# Patient Record
Sex: Female | Born: 1980 | Race: Black or African American | Hispanic: No | Marital: Married | Smoking: Current every day smoker
Health system: Southern US, Community
[De-identification: ages and names within clinical notes are randomized; demographics above are authoritative.]

## PROBLEM LIST (undated history)

## (undated) ENCOUNTER — Inpatient Hospital Stay (HOSPITAL_COMMUNITY): Payer: Self-pay

## (undated) DIAGNOSIS — A549 Gonococcal infection, unspecified: Secondary | ICD-10-CM

## (undated) DIAGNOSIS — R87619 Unspecified abnormal cytological findings in specimens from cervix uteri: Secondary | ICD-10-CM

## (undated) DIAGNOSIS — N739 Female pelvic inflammatory disease, unspecified: Secondary | ICD-10-CM

## (undated) DIAGNOSIS — N83209 Unspecified ovarian cyst, unspecified side: Secondary | ICD-10-CM

## (undated) DIAGNOSIS — A599 Trichomoniasis, unspecified: Secondary | ICD-10-CM

## (undated) DIAGNOSIS — D219 Benign neoplasm of connective and other soft tissue, unspecified: Secondary | ICD-10-CM

## (undated) DIAGNOSIS — IMO0002 Reserved for concepts with insufficient information to code with codable children: Secondary | ICD-10-CM

---
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
## 2000-11-11 ENCOUNTER — Emergency Department (HOSPITAL_COMMUNITY): Admission: EM | Admit: 2000-11-11 | Discharge: 2000-11-11 | Payer: Self-pay | Admitting: Emergency Medicine

## 2000-11-21 ENCOUNTER — Emergency Department (HOSPITAL_COMMUNITY): Admission: EM | Admit: 2000-11-21 | Discharge: 2000-11-21 | Payer: Self-pay | Admitting: Emergency Medicine

## 2001-03-09 ENCOUNTER — Emergency Department (HOSPITAL_COMMUNITY): Admission: EM | Admit: 2001-03-09 | Discharge: 2001-03-09 | Payer: Self-pay | Admitting: Emergency Medicine

## 2001-03-10 ENCOUNTER — Inpatient Hospital Stay (HOSPITAL_COMMUNITY): Admission: AD | Admit: 2001-03-10 | Discharge: 2001-03-10 | Payer: Self-pay | Admitting: Obstetrics & Gynecology

## 2001-04-13 ENCOUNTER — Emergency Department (HOSPITAL_COMMUNITY): Admission: EM | Admit: 2001-04-13 | Discharge: 2001-04-13 | Payer: Self-pay | Admitting: Emergency Medicine

## 2001-04-14 ENCOUNTER — Encounter: Payer: Self-pay | Admitting: Emergency Medicine

## 2001-04-14 ENCOUNTER — Emergency Department (HOSPITAL_COMMUNITY): Admission: EM | Admit: 2001-04-14 | Discharge: 2001-04-14 | Payer: Self-pay | Admitting: Emergency Medicine

## 2001-04-28 ENCOUNTER — Emergency Department (HOSPITAL_COMMUNITY): Admission: EM | Admit: 2001-04-28 | Discharge: 2001-04-28 | Payer: Self-pay | Admitting: Podiatry

## 2001-04-30 ENCOUNTER — Emergency Department (HOSPITAL_COMMUNITY): Admission: EM | Admit: 2001-04-30 | Discharge: 2001-04-30 | Payer: Self-pay | Admitting: Emergency Medicine

## 2001-05-21 ENCOUNTER — Emergency Department (HOSPITAL_COMMUNITY): Admission: EM | Admit: 2001-05-21 | Discharge: 2001-05-21 | Payer: Self-pay | Admitting: Emergency Medicine

## 2001-05-22 ENCOUNTER — Encounter: Payer: Self-pay | Admitting: Emergency Medicine

## 2001-05-28 ENCOUNTER — Inpatient Hospital Stay: Admission: AD | Admit: 2001-05-28 | Discharge: 2001-05-28 | Payer: Self-pay | Admitting: *Deleted

## 2001-09-22 ENCOUNTER — Emergency Department (HOSPITAL_COMMUNITY): Admission: EM | Admit: 2001-09-22 | Discharge: 2001-09-22 | Payer: Self-pay | Admitting: Emergency Medicine

## 2003-10-21 ENCOUNTER — Emergency Department (HOSPITAL_COMMUNITY): Admission: EM | Admit: 2003-10-21 | Discharge: 2003-10-21 | Payer: Self-pay | Admitting: Emergency Medicine

## 2003-11-29 ENCOUNTER — Emergency Department (HOSPITAL_COMMUNITY): Admission: EM | Admit: 2003-11-29 | Discharge: 2003-11-29 | Payer: Self-pay | Admitting: Emergency Medicine

## 2004-04-25 ENCOUNTER — Emergency Department (HOSPITAL_COMMUNITY): Admission: EM | Admit: 2004-04-25 | Discharge: 2004-04-26 | Payer: Self-pay | Admitting: Emergency Medicine

## 2004-07-16 ENCOUNTER — Emergency Department (HOSPITAL_COMMUNITY): Admission: EM | Admit: 2004-07-16 | Discharge: 2004-07-16 | Payer: Self-pay | Admitting: Emergency Medicine

## 2004-11-28 ENCOUNTER — Emergency Department (HOSPITAL_COMMUNITY): Admission: EM | Admit: 2004-11-28 | Discharge: 2004-11-28 | Payer: Self-pay | Admitting: Emergency Medicine

## 2004-12-02 ENCOUNTER — Emergency Department (HOSPITAL_COMMUNITY): Admission: EM | Admit: 2004-12-02 | Discharge: 2004-12-02 | Payer: Self-pay | Admitting: Emergency Medicine

## 2005-04-30 ENCOUNTER — Emergency Department (HOSPITAL_COMMUNITY): Admission: EM | Admit: 2005-04-30 | Discharge: 2005-04-30 | Payer: Self-pay | Admitting: Emergency Medicine

## 2005-05-17 ENCOUNTER — Emergency Department (HOSPITAL_COMMUNITY): Admission: EM | Admit: 2005-05-17 | Discharge: 2005-05-17 | Payer: Self-pay | Admitting: Emergency Medicine

## 2005-12-13 ENCOUNTER — Emergency Department (HOSPITAL_COMMUNITY): Admission: EM | Admit: 2005-12-13 | Discharge: 2005-12-13 | Payer: Self-pay | Admitting: Emergency Medicine

## 2007-12-31 ENCOUNTER — Inpatient Hospital Stay (HOSPITAL_COMMUNITY): Admission: AD | Admit: 2007-12-31 | Discharge: 2007-12-31 | Payer: Self-pay | Admitting: Obstetrics & Gynecology

## 2008-01-18 ENCOUNTER — Emergency Department (HOSPITAL_COMMUNITY): Admission: EM | Admit: 2008-01-18 | Discharge: 2008-01-18 | Payer: Self-pay | Admitting: Emergency Medicine

## 2008-07-18 ENCOUNTER — Ambulatory Visit: Payer: Self-pay | Admitting: Advanced Practice Midwife

## 2008-07-18 ENCOUNTER — Inpatient Hospital Stay (HOSPITAL_COMMUNITY): Admission: AD | Admit: 2008-07-18 | Discharge: 2008-07-18 | Payer: Self-pay | Admitting: Obstetrics & Gynecology

## 2008-07-21 ENCOUNTER — Inpatient Hospital Stay (HOSPITAL_COMMUNITY): Admission: AD | Admit: 2008-07-21 | Discharge: 2008-07-21 | Payer: Self-pay | Admitting: Family Medicine

## 2008-10-31 ENCOUNTER — Emergency Department (HOSPITAL_COMMUNITY): Admission: EM | Admit: 2008-10-31 | Discharge: 2008-10-31 | Payer: Self-pay | Admitting: Emergency Medicine

## 2009-03-31 ENCOUNTER — Inpatient Hospital Stay (HOSPITAL_COMMUNITY): Admission: AD | Admit: 2009-03-31 | Discharge: 2009-03-31 | Payer: Self-pay | Admitting: Family Medicine

## 2009-05-14 ENCOUNTER — Emergency Department (HOSPITAL_COMMUNITY): Admission: EM | Admit: 2009-05-14 | Discharge: 2009-05-14 | Payer: Self-pay | Admitting: Emergency Medicine

## 2010-06-01 ENCOUNTER — Encounter (INDEPENDENT_AMBULATORY_CARE_PROVIDER_SITE_OTHER): Payer: Self-pay | Admitting: Obstetrics and Gynecology

## 2010-06-01 ENCOUNTER — Other Ambulatory Visit: Payer: Self-pay | Admitting: Obstetrics and Gynecology

## 2010-06-01 DIAGNOSIS — R102 Pelvic and perineal pain: Secondary | ICD-10-CM

## 2010-06-01 DIAGNOSIS — Z09 Encounter for follow-up examination after completed treatment for conditions other than malignant neoplasm: Secondary | ICD-10-CM

## 2010-06-01 DIAGNOSIS — N949 Unspecified condition associated with female genital organs and menstrual cycle: Secondary | ICD-10-CM

## 2010-06-01 DIAGNOSIS — N946 Dysmenorrhea, unspecified: Secondary | ICD-10-CM

## 2010-06-05 ENCOUNTER — Ambulatory Visit (HOSPITAL_COMMUNITY)
Admission: RE | Admit: 2010-06-05 | Discharge: 2010-06-05 | Disposition: A | Discharge status: Home / Self Care | Payer: Self-pay | Source: Ambulatory Visit | Attending: Obstetrics and Gynecology | Admitting: Obstetrics and Gynecology

## 2010-06-05 DIAGNOSIS — Z09 Encounter for follow-up examination after completed treatment for conditions other than malignant neoplasm: Secondary | ICD-10-CM

## 2010-06-05 DIAGNOSIS — N809 Endometriosis, unspecified: Secondary | ICD-10-CM | POA: Insufficient documentation

## 2010-06-05 DIAGNOSIS — R102 Pelvic and perineal pain: Secondary | ICD-10-CM

## 2010-06-05 DIAGNOSIS — N949 Unspecified condition associated with female genital organs and menstrual cycle: Secondary | ICD-10-CM | POA: Insufficient documentation

## 2010-06-05 DIAGNOSIS — D252 Subserosal leiomyoma of uterus: Secondary | ICD-10-CM | POA: Insufficient documentation

## 2010-06-21 ENCOUNTER — Encounter (HOSPITAL_COMMUNITY)
Admission: RE | Admit: 2010-06-21 | Discharge: 2010-06-21 | Disposition: A | Discharge status: Home / Self Care | Payer: Self-pay | Source: Ambulatory Visit | Attending: Obstetrics and Gynecology | Admitting: Obstetrics and Gynecology

## 2010-06-21 DIAGNOSIS — Z01812 Encounter for preprocedural laboratory examination: Secondary | ICD-10-CM | POA: Insufficient documentation

## 2010-06-21 LAB — CBC
HCT: 38.9 % (ref 36.0–46.0)
Hemoglobin: 13 g/dL (ref 12.0–15.0)
MCH: 27.8 pg (ref 26.0–34.0)
MCHC: 33.4 g/dL (ref 30.0–36.0)
MCV: 83.3 fL (ref 78.0–100.0)
RBC: 4.67 MIL/uL (ref 3.87–5.11)

## 2010-06-21 LAB — SURGICAL PCR SCREEN: Staphylococcus aureus: NEGATIVE

## 2010-06-23 LAB — URINE CULTURE
Colony Count: NO GROWTH
Culture: NO GROWTH

## 2010-06-23 LAB — URINE MICROSCOPIC-ADD ON

## 2010-06-23 LAB — URINALYSIS, ROUTINE W REFLEX MICROSCOPIC
Glucose, UA: NEGATIVE mg/dL
Ketones, ur: 40 mg/dL — AB
Leukocytes, UA: NEGATIVE
Nitrite: NEGATIVE
Protein, ur: 100 mg/dL — AB
Specific Gravity, Urine: >1.046 — ABNORMAL HIGH (ref 1.005–1.030)
Urobilinogen, UA: 1 mg/dL (ref 0.0–1.0)
pH: 5.5 (ref 5.0–8.0)

## 2010-06-23 LAB — RAPID STREP SCREEN (MED CTR MEBANE ONLY): Streptococcus, Group A Screen (Direct): POSITIVE — AB

## 2010-06-23 NOTE — H&P (Signed)
NAME:  [name] [name], [name] [name]           ACCOUNT NO.:  [id number]  MEDICAL RECORD NO.:  [id number]           PATIENT TYPE:  A  LOCATION:  [facility]                   FACILITY:  [facility]  PHYSICIAN:  Catalina Antigua, MD     DATE OF BIRTH:  1980/07/27  DATE OF SERVICE:                          PRE-OP HISTORY & PHYSICAL  This is a 30 year old nulligravida patient who presents today for evaluation of pelvic pain and irregular period.  The patient states that over the past few years, she has experienced severe dysmenorrhea and has been medically managed with pain medication.  The patient states that she has been actively trying to seek pregnancy for the past 10 years but has not been able to change still, states that for since the onset of her menses at the age of 63.  She is experience irregular bleeding. Describing her periods as occurring as lasting for 5 to 7 days with no intermenstrual bleeding, but states that her periods may come on for 2 months consecutively and get a month or two and then come back again. The patient states that she has tried birth control pills in the past to help regulate her periods, but self-discontinued up to now since that she would experience intermenstrual spotting and a heavier flow.  The patient presents today for further evaluation as she truly desires to conceive.  The patient is married, in a monogamous relationship, and her husband has had children of his own.  PAST MEDICAL HISTORY:  The patient described pelvic pain that is localized in her lower pelvis and also in her left lower quadrant.  The patient states that the pain is 3/10 on most days, but becomes extremely severe with the onset of her menses.  PAST SURGICAL HISTORY:  She denies.  PAST OB HISTORY:  She is nulligravid.  PAST GYN HISTORY:  She reports history of abnormal Pap smear and has had colposcopy.  The patient does not recall her last Pap smear, but states that she has never had a normal  Pap smear.  She has a history of gonorrhea approximately 10 years ago, which was treated, otherwise denies.  She does have a history of ovarian cyst in the past and she currently has two small fibroids.  SOCIAL HISTORY:  She is a current smoker of a few cigarettes a day that she has been smoking for the past 10 years.  The patient is actively trying to quit smoking and I commended her for that.  She denies drinking or the use of illicit drugs.  REVIEW OF SYSTEMS:  Otherwise within normal limits.  FAMILY HISTORY:  Significant for diabetes and hypertension.  No history of GYN malignancies.  PHYSICAL EXAM:  VITAL SIGNS:  Her blood pressure is 123/80, pulse of 70, weight of 61.4 kg, height of 59-1/2 inches. LUNGS:  Clear to auscultation bilaterally. HEART:  Regular rate and rhythm. ABDOMEN:  Soft, nontender, nondistended. PELVIC:  She has a small uterus.  No palpable adnexal masses or tenderness.  Speculum exam showed normal-appearing vaginal mucosa and cervix.  No abnormal bleeding or discharge.  The patient had last ultrasound performed in December 2010, which demonstrated a uterus measuring 8 x 4 x  5 cm with 2 small myometrial fibroid, measuring 1.1 cm and the other 1.3 cm.  She had normal appearing ovaries.  No adnexal masses.  No abdominal free fluid.  ASSESSMENT AND PLAN:  This is a 30 year old nulligravida patient with a history of pelvic pain and dysmenorrhea, who presents today requesting intervention.  In view of the patient's symptoms, the patient was counseled on the possibility that she may have endometriosis, also explained to the patient that management of endometriosis was hormonal with birth control.  As the patient is actively trying to seek pregnancy, she is not interested in any initiation of birth control. The other option discussed was a diagnostic laparoscopy to confirm with possible ablation of the endometrial implants in order to immediately try to conceive.   The patient understands that her fertility may not be completely altered following completely improved following ablation of endometrial implants, but would like to try.  The patient also understands that she would have to be followed in our Infertility Clinic for further management of her infertility.  The patient verbalized understanding and all questions were answered.  The patient is interested in moving forehead with surgical intervention with diagnostic laparoscopy with possible ablation of endometrial implants.  In the meantime, we will order a repeat pelvic ultrasound as her last ultrasound was in December 2010.  The patient verbalized understanding and will be scheduled for surgical intervention.          ______________________________ Catalina Antigua, MD    PC/MEDQ  D:  06/01/2010  T:  06/01/2010  Job:  [id number]

## 2010-06-27 ENCOUNTER — Other Ambulatory Visit: Payer: Self-pay | Admitting: Obstetrics and Gynecology

## 2010-06-27 ENCOUNTER — Ambulatory Visit (HOSPITAL_COMMUNITY)
Admission: RE | Admit: 2010-06-27 | Discharge: 2010-06-27 | Disposition: A | Discharge status: Home / Self Care | Payer: Self-pay | Source: Ambulatory Visit | Attending: Obstetrics and Gynecology | Admitting: Obstetrics and Gynecology

## 2010-06-27 DIAGNOSIS — N946 Dysmenorrhea, unspecified: Secondary | ICD-10-CM | POA: Insufficient documentation

## 2010-07-03 LAB — DIFFERENTIAL
Basophils Absolute: 0 10*3/uL (ref 0.0–0.1)
Lymphocytes Relative: 29 % (ref 12–46)
Lymphs Abs: 1.6 10*3/uL (ref 0.7–4.0)
Neutro Abs: 3.3 10*3/uL (ref 1.7–7.7)
Neutrophils Relative %: 61 % (ref 43–77)

## 2010-07-03 LAB — GC/CHLAMYDIA PROBE AMP, GENITAL
Chlamydia, DNA Probe: NEGATIVE
GC Probe Amp, Genital: NEGATIVE

## 2010-07-03 LAB — POCT PREGNANCY, URINE: Preg Test, Ur: NEGATIVE

## 2010-07-03 LAB — CBC
HCT: 38.5 % (ref 36.0–46.0)
MCHC: 33.5 g/dL (ref 30.0–36.0)
MCV: 84.6 fL (ref 78.0–100.0)
RBC: 4.56 MIL/uL (ref 3.87–5.11)
WBC: 5.3 10*3/uL (ref 4.0–10.5)

## 2010-07-03 LAB — COMPREHENSIVE METABOLIC PANEL
BUN: 3 mg/dL — ABNORMAL LOW (ref 6–23)
CO2: 28 mEq/L (ref 19–32)
Calcium: 9.7 mg/dL (ref 8.4–10.5)
Chloride: 101 mEq/L (ref 96–112)
Creatinine, Ser: 0.73 mg/dL (ref 0.4–1.2)
GFR calc non Af Amer: >60 mL/min (ref >60)
Total Bilirubin: 0.4 mg/dL (ref 0.3–1.2)

## 2010-07-03 LAB — URINALYSIS, ROUTINE W REFLEX MICROSCOPIC
Bilirubin Urine: NEGATIVE
Hgb urine dipstick: NEGATIVE
Ketones, ur: NEGATIVE mg/dL
Nitrite: NEGATIVE
Specific Gravity, Urine: 1.01 (ref 1.005–1.030)
Urobilinogen, UA: 0.2 mg/dL (ref 0.0–1.0)
pH: 7.5 (ref 5.0–8.0)

## 2010-07-03 LAB — WET PREP, GENITAL: Clue Cells Wet Prep HPF POC: NONE SEEN

## 2010-07-05 ENCOUNTER — Ambulatory Visit: Payer: Self-pay | Admitting: Obstetrics and Gynecology

## 2010-07-05 DIAGNOSIS — Z09 Encounter for follow-up examination after completed treatment for conditions other than malignant neoplasm: Secondary | ICD-10-CM

## 2010-07-06 NOTE — Progress Notes (Signed)
NAME:  [name] [name], [name] [name]           ACCOUNT NO.:  [id number]  MEDICAL RECORD NO.:  [id number]           PATIENT TYPE:  A  LOCATION:  [facility]                   FACILITY:  [facility]  PHYSICIAN:  Catalina Antigua, MD     DATE OF BIRTH:  08/10/1980  DATE OF SERVICE:                                 CLINIC NOTE  This is a 30 year old gravida 0 with a longstanding history of dysmenorrhea who is status post diagnostic laparoscopy, lysis of adhesion and pelvic biopsies on June 27, 2010, who presents today for postoperative check.  The patient is currently without complaints. States that her pain has completely resolved, is no longer experiencing any lingering pelvic pain.  DESCRIPTION OF FINDINGS:  At the time of the procedure were revealed to the patient.  The patient verbalized understanding.  All questions were answered.  PHYSICAL EXAMINATION:  VITAL SIGNS:  Her blood pressure is 103/65, pulse of 68, weight of 61.6 kg, height of 69-1/2 inches. ABDOMEN:  Soft, nontender, nondistended.  Umbilical incision as well as the right lower quadrant incisions are healing well.  No erythema, induration or drainage.  ASSESSMENT AND PLAN:  This is a 30 year old nulligravida patient with history of dysmenorrhea with status post diagnostic laparoscopy and lysis of adhesion.  The patient was informed that she has no evidence of endometriosis.  The patient is medically cleared to resume all activities of daily living.  The patient desires to conceive in the near future.  The patient was advised to use over-the-counter ovulation kits as to aid in the timing of intercourse.  If the patient has not been successful, the patient will schedule an appointment in our infertility clinic.          ______________________________ Catalina Antigua, MD    PC/MEDQ  D:  07/05/2010  T:  07/06/2010  Job:  [id number]

## 2010-07-08 LAB — URINALYSIS, ROUTINE W REFLEX MICROSCOPIC
Bilirubin Urine: NEGATIVE
Leukocytes, UA: NEGATIVE
Nitrite: NEGATIVE
Specific Gravity, Urine: 1.022 (ref 1.005–1.030)
Urobilinogen, UA: 0.2 mg/dL (ref 0.0–1.0)
pH: 8 (ref 5.0–8.0)

## 2010-07-08 LAB — CBC
MCHC: 32.8 g/dL (ref 30.0–36.0)
MCV: 84.8 fL (ref 78.0–100.0)
RBC: 5.12 MIL/uL — ABNORMAL HIGH (ref 3.87–5.11)
RDW: 13.7 % (ref 11.5–15.5)

## 2010-07-08 LAB — LIPASE, BLOOD: Lipase: 16 U/L (ref 11–59)

## 2010-07-08 LAB — DIFFERENTIAL
Lymphocytes Relative: 8 % — ABNORMAL LOW (ref 12–46)
Lymphs Abs: 0.7 10*3/uL (ref 0.7–4.0)
Monocytes Relative: 3 % (ref 3–12)
Neutrophils Relative %: 89 % — ABNORMAL HIGH (ref 43–77)

## 2010-07-08 LAB — COMPREHENSIVE METABOLIC PANEL
AST: 25 U/L (ref 0–37)
CO2: 26 mEq/L (ref 19–32)
Calcium: 10.2 mg/dL (ref 8.4–10.5)
Creatinine, Ser: 0.8 mg/dL (ref 0.4–1.2)
GFR calc Af Amer: >60 mL/min (ref >60)
GFR calc non Af Amer: >60 mL/min (ref >60)
Glucose, Bld: 105 mg/dL — ABNORMAL HIGH (ref 70–99)
Sodium: 141 mEq/L (ref 135–145)
Total Protein: 8.4 g/dL — ABNORMAL HIGH (ref 6.0–8.3)

## 2010-07-08 LAB — URINE MICROSCOPIC-ADD ON

## 2010-07-08 LAB — POCT PREGNANCY, URINE: Preg Test, Ur: NEGATIVE

## 2010-07-11 NOTE — Op Note (Signed)
  NAME:  [name] [name], [name] [name]           ACCOUNT NO.:  [id number]  MEDICAL RECORD NO.:  [id number]           PATIENT TYPE:  O  LOCATION:  [facility]                          [facility]  PHYSICIAN:  Catalina Antigua, MD     DATE OF BIRTH:  Oct 02, 1980  DATE OF PROCEDURE:  06/27/2010 DATE OF DISCHARGE:                              OPERATIVE REPORT   PREOPERATIVE DIAGNOSIS:  A 30 year old gravida 0 with a longstanding history of dysmenorrhea who presents today for a diagnostic laparoscopy.  POSTOPERATIVE DIAGNOSIS:  A 30 year old gravida 0 with a longstanding history of dysmenorrhea who presents today for a diagnostic laparoscopy.  PROCEDURE:  Diagnostic laparoscopy, lysis of adhesion, and pelvic biopsies.  SURGEON:  Catalina Antigua, MD  ASSISTANT:  None.  ANESTHESIA:  General.  IV FLUIDS:  1300 mL.  URINE OUTPUT:  100 mL.  ESTIMATED BLOOD LOSS:  Minimal.  COMPLICATIONS:  None.  FINDINGS:  Grossly normal uterus, tubes, and ovaries x2.  Adhesions between adnexa and posterior cul-de-sac bilaterally.  Specimen collected were pelvic biopsies and they were sent to pathology.  PROCEDURE:  After informed consent was obtained, the patient was taken to the operating room where anesthesia was induced and found to be adequate.  The patient was placed in dorsal lithotomy position and prepped and draped in the usual sterile fashion.  A Hulka uterine manipulator was introduced into the uterine cavity.  The surgeons' gloves were then changed and attention was turned to the abdomen. Approximately 1-cm infraumbilical skin incision was made with a scalpel and carried down to the underlying layer of fascia with blunt dissection.  The fascia was grasped with Kocher clamps, tented up, and entered sharply with Mayo scissors.  The fascial incision was tagged with 0 Vicryl.  The perineum was tented up and entered sharply with Metzenbaum scissors.  The 10-mm Hasson was introduced into the  abdominal cavity.  Intra-abdominal placement was confirmed with the laparoscope and pneumoperitoneum was achieved with insufflation of CO2 gas. Preliminary survey of the abdomen revealed the above-noted findings.  A 5-mm skin incision was made on the right side 2 cm above and 2 cm medial to the superior iliac spine.  Under direct visualization, a 5-mm trocar and sleeve were introduced into the abdominal cavity.  Using Endoshears, lysis of adhesion was then performed. In the posterior cul-de-sac where there appeared to be endometriosis, pelvic biopsies were then performed and sent to pathology.  The remainder of the pelvis appeared to be free of endometriosis.  Following lysis of adhesion, restoration of the anatomy was complete and intact.  Hemostasis was maintained.  All instruments were removed from the patient's abdomen.  CO2 gas was released from the abdomen.  The fascia was reapproximated with 0 Vicryl and the skin was closed with Vicryl in a subcuticular fashion.  The patient tolerated the procedure well.  Sponge, lap, and needle counts were correct x2.     Catalina Antigua, MD     PC/MEDQ  D:  06/27/2010  T:  06/28/2010  Job:  [id number]  Electronically Signed by Catalina Antigua  on 07/11/2010 10:18:03 AM

## 2010-07-12 LAB — GC/CHLAMYDIA PROBE AMP, GENITAL
Chlamydia, DNA Probe: NEGATIVE
GC Probe Amp, Genital: NEGATIVE

## 2010-07-12 LAB — URINALYSIS, ROUTINE W REFLEX MICROSCOPIC
Glucose, UA: NEGATIVE mg/dL
Nitrite: NEGATIVE
Specific Gravity, Urine: <1.005 — ABNORMAL LOW (ref 1.005–1.030)
pH: 5.5 (ref 5.0–8.0)

## 2010-07-12 LAB — WET PREP, GENITAL: Yeast Wet Prep HPF POC: NONE SEEN

## 2010-07-26 ENCOUNTER — Ambulatory Visit: Payer: Self-pay | Admitting: Obstetrics and Gynecology

## 2010-08-01 HISTORY — PX: DIAGNOSTIC LAPAROSCOPY: SUR761

## 2010-08-25 ENCOUNTER — Ambulatory Visit: Payer: Self-pay | Admitting: Obstetrics and Gynecology

## 2010-08-25 DIAGNOSIS — Z09 Encounter for follow-up examination after completed treatment for conditions other than malignant neoplasm: Secondary | ICD-10-CM

## 2010-08-26 NOTE — Group Therapy Note (Signed)
NAME:  [name], [name]           ACCOUNT NO.:  [id number]  MEDICAL RECORD NO.:  [id number]           PATIENT TYPE:  A  LOCATION[facility]                   FACILITY:  [facility]  PHYSICIAN:  Catalina Antigua, MD     DATE OF BIRTH:  [birth year]  DATE OF SERVICE:  08/25/2010                                 CLINIC NOTE  HISTORY OF PRESENT ILLNESS:  This is a 30 year old gravida 0 who is status post diagnostic laparoscopy and lysis of adhesions on June 27, 2010 who presents today for postoperative check.  The patient is currently without any complaints.  Denies abnormal bleeding or discharge.  She does states that she feels like her fibroids were causing her to have pain, but otherwise is doing well.  PHYSICAL EXAMINATION:  VITAL SIGNS:  Her blood pressure is 114/74, pulse of 79, weight of 60.8 kg, and height of 69-1/2 inches. LUNGS:  Clear to auscultation bilaterally. HEART:  Regular rate and rhythm. ABDOMEN:  Soft, nontender, and nondistended.  Her umbilical incision and right lower quadrant incision are completely healed. PELVIC:  Normal vaginal mucosa and normal-appearing cervix.  No abnormal bleeding or discharge.  ASSESSMENT AND PLAN:  This is a 30 year old nulligravida with history of dysmenorrhea who is status post diagnostic laparoscopy on June 27, 2010 who presents for postoperative check.  A wet prep was collected.  The patient reported a history of foul smelling discharge, although no malodorous odor was appreciated.  The patient was reassured and knowing that she had a normal-sized uterus with a subdural fibroid measuring 2 cm and 5 inches diameter and it sounds likely that this is the source of her pain.  The patient states that her dysmenorrhea has significantly improved.  The patient is still actively trying to conceive.  The patient was advised to return in 6 months to our Infertility Clinic if she has not been successful in conceiving.  The patient will  otherwise follow up in March for her annual exam.          ______________________________ Catalina Antigua, MD    PC/MEDQ  D:  08/25/2010  T:  08/26/2010  Job:  [id number]

## 2010-08-29 ENCOUNTER — Inpatient Hospital Stay (HOSPITAL_COMMUNITY)
Admission: AD | Admit: 2010-08-29 | Discharge: 2010-08-29 | Disposition: A | Discharge status: Home / Self Care | Payer: Self-pay | Source: Ambulatory Visit | Attending: Family Medicine | Admitting: Family Medicine

## 2010-08-29 ENCOUNTER — Inpatient Hospital Stay (HOSPITAL_COMMUNITY): Payer: Self-pay

## 2010-08-29 DIAGNOSIS — N83209 Unspecified ovarian cyst, unspecified side: Secondary | ICD-10-CM | POA: Insufficient documentation

## 2010-08-29 DIAGNOSIS — R109 Unspecified abdominal pain: Secondary | ICD-10-CM

## 2010-08-29 LAB — URINALYSIS, ROUTINE W REFLEX MICROSCOPIC
Bilirubin Urine: NEGATIVE
Glucose, UA: NEGATIVE mg/dL
Hgb urine dipstick: NEGATIVE
Nitrite: NEGATIVE
Specific Gravity, Urine: 1.02 (ref 1.005–1.030)
pH: 6.5 (ref 5.0–8.0)

## 2010-08-29 LAB — COMPREHENSIVE METABOLIC PANEL
ALT: 13 U/L (ref 0–35)
Alkaline Phosphatase: 59 U/L (ref 39–117)
CO2: 26 mEq/L (ref 19–32)
Calcium: 9.8 mg/dL (ref 8.4–10.5)
GFR calc non Af Amer: >60 mL/min (ref >60)
Glucose, Bld: 81 mg/dL (ref 70–99)
Potassium: 4 mEq/L (ref 3.5–5.1)
Sodium: 136 mEq/L (ref 135–145)

## 2010-08-29 LAB — DIFFERENTIAL
Basophils Absolute: 0 10*3/uL (ref 0.0–0.1)
Lymphocytes Relative: 32 % (ref 12–46)
Lymphs Abs: 1.8 10*3/uL (ref 0.7–4.0)
Monocytes Absolute: 0.5 10*3/uL (ref 0.1–1.0)
Neutro Abs: 3.2 10*3/uL (ref 1.7–7.7)

## 2010-08-29 LAB — CBC
HCT: 38.3 % (ref 36.0–46.0)
Hemoglobin: 12.7 g/dL (ref 12.0–15.0)
MCHC: 33.2 g/dL (ref 30.0–36.0)

## 2010-08-29 LAB — POCT PREGNANCY, URINE: Preg Test, Ur: NEGATIVE

## 2010-08-29 MED ORDER — IOHEXOL 300 MG/ML  SOLN
100.0000 mL | Freq: Once | INTRAMUSCULAR | Status: AC | PRN
  Administered 2010-08-29: 100 mL via INTRAVENOUS

## 2010-09-20 ENCOUNTER — Ambulatory Visit: Payer: Self-pay | Admitting: Obstetrics & Gynecology

## 2010-09-20 ENCOUNTER — Other Ambulatory Visit: Payer: Self-pay | Admitting: Obstetrics & Gynecology

## 2010-09-20 DIAGNOSIS — N949 Unspecified condition associated with female genital organs and menstrual cycle: Secondary | ICD-10-CM

## 2010-09-20 DIAGNOSIS — Z3009 Encounter for other general counseling and advice on contraception: Secondary | ICD-10-CM

## 2010-09-20 DIAGNOSIS — N83209 Unspecified ovarian cyst, unspecified side: Secondary | ICD-10-CM

## 2010-09-20 LAB — POCT PREGNANCY, URINE: Preg Test, Ur: NEGATIVE

## 2010-09-21 NOTE — Group Therapy Note (Unsigned)
NAMEMarland Kitchen  LYNNDA, WIERSMA           ACCOUNT NO.:  [id number]  MEDICAL RECORD NO.:  [id number]           PATIENT TYPE:  A  LOCATION:  [facility]                   [facility]ACILITY:  [facility]  PHYSICIAN:  Jaynie Collins, MD     DATE OF BIRTH:  Oct 17, 1980  DATE OF SERVICE:  09/20/2010                                 CLINIC NOTE  [name] is a 30 year old gravida 0 who was seen in the [facility] and was diagnosed with a 5-cm ruptured ovarian cyst.  The patient is here today for followup.  She denies any pain or any other concerns.  The patient just wants to be on birth control at this point and is requesting her prescription for birth control pills.  She declines having any physical examination.  The patient has been on birth control pills in the past, but has not taken it lately.  She was counseled regarding the different modalities, and she decided on Sprintec and a prescription was given to her.  She was told that she would need to come back in 3 months for blood pressure and OCP check, and she was told to call or come back in for any further gynecologic concerns.  The patient is up to date with her Pap smears, and this was done in March 2012.          ______________________________ Jaynie Collins, MD    UA/MEDQ  D:  09/20/2010  T:  09/21/2010  Job:  [id number]

## 2010-11-01 ENCOUNTER — Encounter: Payer: Self-pay | Admitting: *Deleted

## 2010-11-01 DIAGNOSIS — N979 Female infertility, unspecified: Secondary | ICD-10-CM

## 2010-11-01 DIAGNOSIS — N946 Dysmenorrhea, unspecified: Secondary | ICD-10-CM | POA: Insufficient documentation

## 2010-11-01 DIAGNOSIS — R102 Pelvic and perineal pain: Secondary | ICD-10-CM | POA: Insufficient documentation

## 2010-11-15 ENCOUNTER — Ambulatory Visit: Payer: Self-pay | Admitting: Obstetrics & Gynecology

## 2011-01-01 LAB — D-DIMER, QUANTITATIVE: D-Dimer, Quant: 0.5 — ABNORMAL HIGH

## 2011-01-01 LAB — CBC
Hemoglobin: 14
MCHC: 33.1
MCHC: 32.6
MCV: 83.8
MCV: 85
Platelets: 316
RBC: 5.05
RDW: 14

## 2011-01-01 LAB — LIPASE, BLOOD: Lipase: 19

## 2011-01-01 LAB — COMPREHENSIVE METABOLIC PANEL
BUN: 8
CO2: 28
Calcium: 10.1
Creatinine, Ser: 0.84
GFR calc non Af Amer: >60
Glucose, Bld: 104 — ABNORMAL HIGH
Sodium: 142
Total Protein: 8.8 — ABNORMAL HIGH

## 2011-01-01 LAB — URINE MICROSCOPIC-ADD ON

## 2011-01-01 LAB — DIFFERENTIAL
Eosinophils Absolute: 0
Lymphocytes Relative: 15
Lymphs Abs: 1.2
Monocytes Relative: 8
Neutro Abs: 6.6
Neutrophils Relative %: 77

## 2011-01-01 LAB — URINALYSIS, ROUTINE W REFLEX MICROSCOPIC
Bilirubin Urine: NEGATIVE
Glucose, UA: NEGATIVE
Hgb urine dipstick: NEGATIVE
Ketones, ur: NEGATIVE
Nitrite: NEGATIVE
Protein, ur: NEGATIVE
Specific Gravity, Urine: 1.027
Urobilinogen, UA: 0.2
Urobilinogen, UA: 0.2

## 2011-01-01 LAB — POCT CARDIAC MARKERS
CKMB, poc: <1 — ABNORMAL LOW
Myoglobin, poc: 53.3
Troponin i, poc: <0.05

## 2011-01-01 LAB — WET PREP, GENITAL: Clue Cells Wet Prep HPF POC: NONE SEEN

## 2011-01-01 LAB — GC/CHLAMYDIA PROBE AMP, GENITAL: GC Probe Amp, Genital: NEGATIVE

## 2011-02-19 ENCOUNTER — Inpatient Hospital Stay (HOSPITAL_COMMUNITY): Payer: Self-pay

## 2011-02-19 ENCOUNTER — Encounter (HOSPITAL_COMMUNITY): Payer: Self-pay | Admitting: *Deleted

## 2011-02-19 ENCOUNTER — Inpatient Hospital Stay (HOSPITAL_COMMUNITY)
Admission: AD | Admit: 2011-02-19 | Discharge: 2011-02-19 | Disposition: A | Discharge status: Home / Self Care | Payer: Self-pay | Source: Ambulatory Visit | Attending: Obstetrics & Gynecology | Admitting: Obstetrics & Gynecology

## 2011-02-19 DIAGNOSIS — N83209 Unspecified ovarian cyst, unspecified side: Secondary | ICD-10-CM | POA: Insufficient documentation

## 2011-02-19 DIAGNOSIS — N949 Unspecified condition associated with female genital organs and menstrual cycle: Secondary | ICD-10-CM

## 2011-02-19 DIAGNOSIS — N83299 Other ovarian cyst, unspecified side: Secondary | ICD-10-CM

## 2011-02-19 DIAGNOSIS — R109 Unspecified abdominal pain: Secondary | ICD-10-CM | POA: Insufficient documentation

## 2011-02-19 DIAGNOSIS — R102 Pelvic and perineal pain: Secondary | ICD-10-CM

## 2011-02-19 HISTORY — DX: Reserved for concepts with insufficient information to code with codable children: IMO0002

## 2011-02-19 HISTORY — DX: Unspecified abnormal cytological findings in specimens from cervix uteri: R87.619

## 2011-02-19 HISTORY — DX: Gonococcal infection, unspecified: A54.9

## 2011-02-19 HISTORY — DX: Trichomoniasis, unspecified: A59.9

## 2011-02-19 HISTORY — DX: Female pelvic inflammatory disease, unspecified: N73.9

## 2011-02-19 HISTORY — DX: Unspecified ovarian cyst, unspecified side: N83.209

## 2011-02-19 LAB — URINALYSIS, ROUTINE W REFLEX MICROSCOPIC
Bilirubin Urine: NEGATIVE
Ketones, ur: NEGATIVE mg/dL
Leukocytes, UA: NEGATIVE
Nitrite: NEGATIVE
Protein, ur: NEGATIVE mg/dL
pH: 6 (ref 5.0–8.0)

## 2011-02-19 LAB — CBC
HCT: 38.8 % (ref 36.0–46.0)
Hemoglobin: 12.9 g/dL (ref 12.0–15.0)
MCH: 28.2 pg (ref 26.0–34.0)
RBC: 4.58 MIL/uL (ref 3.87–5.11)

## 2011-02-19 LAB — DIFFERENTIAL
Eosinophils Absolute: 0.1 10*3/uL (ref 0.0–0.7)
Lymphs Abs: 1.4 10*3/uL (ref 0.7–4.0)
Monocytes Absolute: 0.4 10*3/uL (ref 0.1–1.0)
Monocytes Relative: 10 % (ref 3–12)
Neutro Abs: 2.5 10*3/uL (ref 1.7–7.7)
Neutrophils Relative %: 55 % (ref 43–77)

## 2011-02-19 LAB — ABO/RH: ABO/RH(D): O POS

## 2011-02-19 LAB — WET PREP, GENITAL
Clue Cells Wet Prep HPF POC: NONE SEEN
Trich, Wet Prep: NONE SEEN

## 2011-02-19 LAB — POCT PREGNANCY, URINE: Preg Test, Ur: NEGATIVE

## 2011-02-19 MED ORDER — OXYCODONE-ACETAMINOPHEN 5-325 MG PO TABS
2.0000 | ORAL_TABLET | Freq: Once | ORAL | Status: AC
  Administered 2011-02-19: 2 via ORAL
  Filled 2011-02-19: qty 2

## 2011-02-19 MED ORDER — OXYCODONE-ACETAMINOPHEN 5-325 MG PO TABS: 1.0000 | ORAL_TABLET | Freq: Four times a day (QID) | ORAL | Status: AC | PRN

## 2011-02-19 NOTE — ED Notes (Signed)
Pharmacy called regarding pain medication

## 2011-02-19 NOTE — Progress Notes (Signed)
Bleeding started yesterday.

## 2011-02-19 NOTE — ED Notes (Signed)
Pain still there, just sleepy now.

## 2011-02-19 NOTE — ED Provider Notes (Signed)
History   [name] [name] is a 30 y.o. year old G0P0000 female LMP 01/14/11 who presents to [facility] reporting low abd pain x 3 days and severe cramping and spotting since 02/18/11. She has a lengthy Hx of pelvic and abd pain, but state that this is different.  Hx added by E. Sloane Junkin, RNFNP---Patient states she had a normal BM today.  Reports pelvic pain begins 1 week prior to menses, painful menses and pain continues several days after bleeding.  She has 2 week interval free of pain.  She states she has been trying to conceive without success and is now tired of the pain and would like to discuss hysterectomy with her doctor in the clinic.  Reassured her we would message gyn clinic to call her for appt.    CSN: [id number] Arrival date & time: 02/19/2011  6:25 AM   None     Chief Complaint  Patient presents with  . Abdominal Pain  . Vaginal Bleeding    (Consider location/radiation/quality/duration/timing/severity/associated sxs/prior treatment) HPI  Past Medical History  Diagnosis Date  . Complication of anesthesia   . Ovarian cyst   . Abnormal Pap smear   . PID (pelvic inflammatory disease)   . Gonorrhea   . Trichomonas    Past Surgical History  Procedure Date  . Diagnostic laparoscopy May 2012  lysis of adhesions  The patient has a family history of:   History  Substance Use Topics  . Smoking status: Not on file  . Smokeless tobacco: Not on file  . Alcohol Use: Not on file    OB History    Grav Para Term Preterm Abortions TAB SAB Ect Mult Living   0 0 0 0 0 0 0 0 0 0       Review of Systems  Constitutional: Negative for fever and chills.       Hot flashes  Gastrointestinal: Positive for nausea and abdominal pain (constant bilat low abd pain and cramping LLQ). Negative for vomiting, diarrhea, constipation and abdominal distention.  Genitourinary: Positive for vaginal bleeding (spotting) and menstrual problem (delayed onset of menses. Spotting only). Negative for  dysuria, urgency, frequency, hematuria, flank pain, vaginal discharge and vaginal pain.    Allergies  Naproxen; Excedrin; and Ibuprofen  Home Medications  No current outpatient prescriptions on file.  BP 115/69  Pulse 66  Temp(Src) 98.7 F (37.1 C) (Oral)  Resp 16  Ht 4\' 11"  (1.499 m)  Wt 62.506 kg (137 lb 12.8 oz)  BMI 27.83 kg/m2  LMP 01/14/2011  Physical Exam  Nursing note and vitals reviewed. Constitutional: She is oriented to person, place, and time. She appears well-developed and well-nourished. She appears distressed (mild).  Cardiovascular: Normal rate.   Pulmonary/Chest: Effort normal.  Abdominal: Soft. Bowel sounds are normal. She exhibits distension (mild). She exhibits no mass. There is tenderness (suprapubic and LLQ). There is guarding (LLQ). There is no rebound.  Genitourinary: Uterus is tender. Uterus is not enlarged and not fixed. Cervix exhibits no motion tenderness, no discharge and no friability. Right adnexum displays tenderness. Right adnexum displays no mass and no fullness. Left adnexum displays tenderness. Left adnexum displays no mass and no fullness. There is tenderness around the vagina. No bleeding around the vagina. No vaginal discharge found.       Pelvic exam by Lynder Parents, RN FNP  Neurological: She is alert and oriented to person, place, and time.  Skin: Skin is warm and dry.  Psychiatric: She has a normal mood and affect.  Results for orders placed during the hospital encounter of 02/19/11 (from the past 24 hour(s))  URINALYSIS, ROUTINE W REFLEX MICROSCOPIC     Status: Abnormal   Collection Time   02/19/11  6:40 AM      Component Value Range   Color, Urine YELLOW  YELLOW    Appearance CLEAR  CLEAR    Specific Gravity, Urine 1.025  1.005 - 1.030    pH 6.0  5.0 - 8.0    Glucose, UA NEGATIVE  NEGATIVE (mg/dL)   Hgb urine dipstick TRACE (*) NEGATIVE    Bilirubin Urine NEGATIVE  NEGATIVE    Ketones, ur NEGATIVE  NEGATIVE (mg/dL)   Protein, ur  NEGATIVE  NEGATIVE (mg/dL)   Urobilinogen, UA 0.2  0.0 - 1.0 (mg/dL)   Nitrite NEGATIVE  NEGATIVE    Leukocytes, UA NEGATIVE  NEGATIVE   URINE MICROSCOPIC-ADD ON     Status: Normal   Collection Time   02/19/11  6:40 AM      Component Value Range   Squamous Epithelial / LPF RARE  RARE    RBC / HPF 0-2  <3 (RBC/hpf)  POCT PREGNANCY, URINE     Status: Normal   Collection Time   02/19/11  6:54 AM      Component Value Range   Preg Test, Ur NEGATIVE    ABO/RH     Status: Normal   Collection Time   02/19/11  7:17 AM      Component Value Range   ABO/RH(D) O POS    HCG, QUANTITATIVE, PREGNANCY     Status: Normal   Collection Time   02/19/11  7:17 AM      Component Value Range   hCG, Beta Chain, Quant, S <1  <5 (mIU/mL)  CBC     Status: Normal   Collection Time   02/19/11  8:28 AM      Component Value Range   WBC 4.5  4.0 - 10.5 (K/uL)   RBC 4.58  3.87 - 5.11 (MIL/uL)   Hemoglobin 12.9  12.0 - 15.0 (g/dL)   HCT 57.8  46.9 - 62.9 (%)   MCV 84.7  78.0 - 100.0 (fL)   MCH 28.2  26.0 - 34.0 (pg)   MCHC 33.2  30.0 - 36.0 (g/dL)   RDW 52.8  41.3 - 24.4 (%)   Platelets 317  150 - 400 (K/uL)  DIFFERENTIAL     Status: Normal   Collection Time   02/19/11  8:28 AM      Component Value Range   Neutrophils Relative 55  43 - 77 (%)   Neutro Abs 2.5  1.7 - 7.7 (K/uL)   Lymphocytes Relative 32  12 - 46 (%)   Lymphs Abs 1.4  0.7 - 4.0 (K/uL)   Monocytes Relative 10  3 - 12 (%)   Monocytes Absolute 0.4  0.1 - 1.0 (K/uL)   Eosinophils Relative 2  0 - 5 (%)   Eosinophils Absolute 0.1  0.0 - 0.7 (K/uL)   Basophils Relative 1  0 - 1 (%)   Basophils Absolute 0.0  0.0 - 0.1 (K/uL)  WET PREP, GENITAL     Status: Abnormal   Collection Time   02/19/11  8:28 AM      Component Value Range   Yeast, Wet Prep NONE SEEN  NONE SEEN    Trich, Wet Prep NONE SEEN  NONE SEEN    Clue Cells, Wet Prep NONE SEEN  NONE SEEN    WBC,  Wet Prep HPF POC FEW (*) NONE SEEN        *RADIOLOGY REPORT*  Clinical  Data: Pelvic pain.  TRANSABDOMINAL AND TRANSVAGINAL ULTRASOUND OF PELVIS  Technique: Both transabdominal and transvaginal ultrasound  examinations of the pelvis were performed. Transabdominal technique  was performed for global imaging of the pelvis including uterus,  ovaries, adnexal regions, and pelvic cul-de-sac.  Comparison: 06/05/2010  It was necessary to proceed with endovaginal exam following the  transabdominal exam to visualize the ovaries.  Findings:  Uterus: Measures 8.4 x 4.6 x 5.1 cm, essentially unchanged. To  subserosal fibroids, one and anteriorly into the right measuring  1.7 x 1.2 x 2.2 cm of one posterior inferior and to the left  measuring 1.2 x 0.5 and 0.6 cm. These are essentially unchanged.  No new fibroids.  Endometrium: Normal in thickness and appearance, 10 mm in  thickness.  Right ovary: Normal. 4.4 x 2.4 x 4.2 cm.  Left ovary: Since the prior exam the patient has developed a 4.9 x  4.3 x 4.5 cm complex cystic structure in the left ovary with a  fluid-fluid level. There are several thin internal septations.  Other findings: No free fluid  IMPRESSION:  Complex 4.9 cm and cystic structure on the left ovary, new since  the prior exam. This probably represents a hemorrhagic cyst. Less  likely could represent endometrioma. This could be followed up in  6-12 weeks to ensure resolution.  Original Report Authenticated By: Gwynn Burly, M.D.     ED Course  Procedures (including critical care time)   GC/CHL culture to lab.   MDM  Requesting pain meds--Percocet 2 tabs po ordered. Jeani Sow, NP assumed care of pt at 0800.  10:54  Reported [place] results and patient's hx to Dr. Debroah Loop.  Will refer back to [facility] for re-evaluation.  ASSESSMENT AND PLAN: A: hemorraghic cyst on the left ovary  P:  Request has been sent for the GYN CLINIC to make follow up appt.     Rx for [employer], [place] 02/19/2011 8:25 AM         Matt Holmes,  NP 02/19/11 1100

## 2011-02-19 NOTE — ED Notes (Signed)
Np in discussing dx and reason for pain today.  GYN clinic will call with appt.

## 2011-02-19 NOTE — Progress Notes (Signed)
Pt LMP 01/14/2011, having lower abd cramping and LLQ pain x 3 days.  Spotting since 11/18.

## 2011-02-19 NOTE — Progress Notes (Signed)
Pain started 3 days ago, has gotten worse.  Was doing regular activities at time of onset.

## 2011-02-19 NOTE — ED Notes (Signed)
Pt to [place], asked sig other to go get her some food

## 2011-02-19 NOTE — ED Notes (Signed)
Last 2 yrs, periods have gotten worse.  Been to DR and ED several times for pain, similar to what is having now.  Hx of cysts, ruptured cysts.  Had dx lap in May of 2012 (Dr Hyman Hopes a lot of scar tissue adhesion, but did not have PCOS or endometriosis.

## 2011-02-20 LAB — GC/CHLAMYDIA PROBE AMP, GENITAL
Chlamydia, DNA Probe: NEGATIVE
GC Probe Amp, Genital: NEGATIVE

## 2011-03-21 ENCOUNTER — Encounter: Payer: Self-pay | Admitting: Advanced Practice Midwife

## 2011-03-21 ENCOUNTER — Ambulatory Visit (INDEPENDENT_AMBULATORY_CARE_PROVIDER_SITE_OTHER): Payer: Self-pay | Admitting: Obstetrics and Gynecology

## 2011-03-21 DIAGNOSIS — N83209 Unspecified ovarian cyst, unspecified side: Secondary | ICD-10-CM

## 2011-03-21 DIAGNOSIS — G8929 Other chronic pain: Secondary | ICD-10-CM

## 2011-03-21 DIAGNOSIS — R102 Pelvic and perineal pain: Secondary | ICD-10-CM

## 2011-03-21 DIAGNOSIS — N949 Unspecified condition associated with female genital organs and menstrual cycle: Secondary | ICD-10-CM

## 2011-03-21 MED ORDER — FAMOTIDINE 20 MG PO TABS: 20.0000 mg | ORAL_TABLET | Freq: Two times a day (BID) | ORAL | Status: DC

## 2011-03-21 MED ORDER — OXYCODONE-ACETAMINOPHEN 5-325 MG PO TABS: 1.0000 | ORAL_TABLET | ORAL | Status: AC | PRN

## 2011-03-21 NOTE — Patient Instructions (Signed)
Ovarian Cyst The ovaries are small organs that are on each side of the uterus. The ovaries are the organs that produce the female hormones, estrogen and progesterone. An ovarian cyst is a sac filled with fluid that can vary in its size. It is normal for a small cyst to form in women who are in the childbearing age and who have menstrual periods. This type of cyst is called a follicle cyst that becomes an ovulation cyst (corpus luteum cyst) after it produces the women's egg. It later goes away on its own if the woman does not become pregnant. There are other kinds of ovarian cysts that may cause problems and may need to be treated. The most serious problem is a cyst with cancer. It should be noted that menopausal women who have an ovarian cyst are at a higher risk of it being a cancer cyst. They should be evaluated very quickly, thoroughly and followed closely. This is especially true in menopausal women because of the high rate of ovarian cancer in women in menopause. CAUSES AND TYPES OF OVARIAN CYSTS:  FUNCTIONAL CYST: The follicle/corpus luteum cyst is a functional cyst that occurs every month during ovulation with the menstrual cycle. They go away with the next menstrual cycle if the woman does not get pregnant. Usually, there are no symptoms with a functional cyst.   ENDOMETRIOMA CYST: This cyst develops from the lining of the uterus tissue. This cyst gets in or on the ovary. It grows every month from the bleeding during the menstrual period. It is also called a "chocolate cyst" because it becomes filled with blood that turns brown. This cyst can cause pain in the lower abdomen during intercourse and with your menstrual period.   CYSTADENOMA CYST: This cyst develops from the cells on the outside of the ovary. They usually are not cancerous. They can get very big and cause lower abdomen pain and pain with intercourse. This type of cyst can twist on itself, cut off its blood supply and cause severe pain.  It also can easily rupture and cause a lot of pain.   DERMOID CYST: This type of cyst is sometimes found in both ovaries. They are found to have different kinds of body tissue in the cyst. The tissue includes skin, teeth, hair, and/or cartilage. They usually do not have symptoms unless they get very big. Dermoid cysts are rarely cancerous.   POLYCYSTIC OVARY: This is a rare condition with hormone problems that produces many small cysts on both ovaries. The cysts are follicle-like cysts that never produce an egg and become a corpus luteum. It can cause an increase in body weight, infertility, acne, increase in body and facial hair and lack of menstrual periods or rare menstrual periods. Many women with this problem develop type 2 diabetes. The exact cause of this problem is unknown. A polycystic ovary is rarely cancerous.   THECA LUTEIN CYST: Occurs when too much hormone (human chorionic gonadotropin) is produced and over-stimulates the ovaries to produce an egg. They are frequently seen when doctors stimulate the ovaries for invitro-fertilization (test tube babies).   LUTEOMA CYST: This cyst is seen during pregnancy. Rarely it can cause an obstruction to the birth canal during labor and delivery. They usually go away after delivery.  SYMPTOMS   Pelvic pain or pressure.   Pain during sexual intercourse.   Increasing girth (swelling) of the abdomen.   Abnormal menstrual periods.   Increasing pain with menstrual periods.   You stop having   menstrual periods and you are not pregnant.  DIAGNOSIS  The diagnosis can be made during:  Routine or annual pelvic examination (common).   Ultrasound.   X-ray of the pelvis.   CT Scan.   MRI.   Blood tests.  TREATMENT   Treatment may only be to follow the cyst monthly for 2 to 3 months with your caregiver. Many go away on their own, especially functional cysts.   May be aspirated (drained) with a long needle with ultrasound, or by laparoscopy  (inserting a tube into the pelvis through a small incision).   The whole cyst can be removed by laparoscopy.   Sometimes the cyst may need to be removed through an incision in the lower abdomen.   Hormone treatment is sometimes used to help dissolve certain cysts.   Birth control pills are sometimes used to help dissolve certain cysts.  HOME CARE INSTRUCTIONS  Follow your caregiver's advice regarding:  Medicine.   Follow up visits to evaluate and treat the cyst.   You may need to come back or make an appointment with another caregiver, to find the exact cause of your cyst, if your caregiver is not a gynecologist.   Get your yearly and recommended pelvic examinations and Pap tests.   Let your caregiver know if you have had an ovarian cyst in the past.  SEEK MEDICAL CARE IF:   Your periods are late, irregular, they stop, or are painful.   Your stomach (abdomen) or pelvic pain does not go away.   Your stomach becomes larger or swollen.   You have pressure on your bladder or trouble emptying your bladder completely.   You have painful sexual intercourse.   You have feelings of fullness, pressure, or discomfort in your stomach.   You lose weight for no apparent reason.   You feel generally ill.   You become constipated.   You lose your appetite.   You develop acne.   You have an increase in body and facial hair.   You are gaining weight, without changing your exercise and eating habits.   You think you are pregnant.  SEEK IMMEDIATE MEDICAL CARE IF:   You have increasing abdominal pain.   You feel sick to your stomach (nausea) and/or vomit.   You develop a fever that comes on suddenly.   You develop abdominal pain during a bowel movement.   Your menstrual periods become heavier than usual.  Document Released: 03/19/2005 Document Revised: 11/29/2010 Document Reviewed: 01/20/2009 [employer] Patient Information 2012 [employer]. 

## 2011-03-21 NOTE — Progress Notes (Signed)
30 yo G0 presenting today for evaluation of ovarian cyst. Patient with 5 cm left hemorrhagic cyst diagnosed on 02/2011 present here for follow-up examination. Patient reports that she is in severe pain and is requesting a hysterectomy. Patient declined medical management with OCP, depo-provera. Patient had a diagnostic laparoscopy for endometriosis earlier this year without any endometrial implants visualized. Patient declined a trial of depo-Lupron and is adamant on hysterectomy. I informed the patient that I would not perform a hysterectomy with BSO on a patient without trying medical management first.  Ultrasound to be scheduled week of 04/09/2011. Rx percocet provided.

## 2011-04-07 ENCOUNTER — Encounter (HOSPITAL_COMMUNITY): Payer: Self-pay | Admitting: *Deleted

## 2011-04-07 ENCOUNTER — Ambulatory Visit (HOSPITAL_COMMUNITY)
Admission: EM | Admit: 2011-04-07 | Discharge: 2011-04-07 | Discharge status: Against Medical Advice | Payer: Self-pay | Attending: Emergency Medicine | Admitting: Emergency Medicine

## 2011-04-07 DIAGNOSIS — R110 Nausea: Secondary | ICD-10-CM | POA: Insufficient documentation

## 2011-04-07 LAB — URINALYSIS, ROUTINE W REFLEX MICROSCOPIC
Bilirubin Urine: NEGATIVE
Glucose, UA: NEGATIVE mg/dL
Hgb urine dipstick: NEGATIVE
Ketones, ur: NEGATIVE mg/dL
Protein, ur: 30 mg/dL — AB

## 2011-04-07 LAB — URINE MICROSCOPIC-ADD ON

## 2011-04-07 NOTE — ED Notes (Signed)
Pt reports body aches, nausea and vomitting that started at 0600 today.

## 2011-04-18 ENCOUNTER — Telehealth: Payer: Self-pay | Admitting: *Deleted

## 2011-04-18 NOTE — Telephone Encounter (Signed)
Patient called stating she has appt here on Friday and she upset that Dr. Jolayne Panther did not set her up for hysterectomy. She was not checked at her last visit and went to ER after visit. Pt states had ruptured cyst. Pt states in extreme pain and wants this matter taken care of.

## 2011-04-20 ENCOUNTER — Ambulatory Visit: Payer: Self-pay | Admitting: Obstetrics & Gynecology

## 2011-04-20 ENCOUNTER — Ambulatory Visit (HOSPITAL_COMMUNITY): Payer: Self-pay

## 2011-04-26 ENCOUNTER — Ambulatory Visit (HOSPITAL_COMMUNITY): Admission: RE | Admit: 2011-04-26 | Payer: Self-pay | Source: Ambulatory Visit

## 2011-04-26 ENCOUNTER — Telehealth: Payer: Self-pay | Admitting: *Deleted

## 2011-04-26 NOTE — Telephone Encounter (Signed)
Pt is going to emergency room now. Will keep her followup appt here.

## 2011-04-26 NOTE — Telephone Encounter (Signed)
Patient called this am at [phone] and left a message she has a cyst on her ovary and is in a lot of pain- states it feels like it has ruptured because I've had one to rupture before. States she has an ultrasound this am and I was wondering if you could see me before my appointment 05/08/11- please call me.called patient and left a message we are returning her call- please keep scheduled appointment for today( ultrasound) and call us back. Per chart review pt. Ultrasound was cancelled for today per patient - with no reschedule noted .

## 2011-05-14 ENCOUNTER — Inpatient Hospital Stay (HOSPITAL_COMMUNITY)
Admission: AD | Admit: 2011-05-14 | Discharge: 2011-05-14 | Disposition: A | Discharge status: Home / Self Care | Payer: Self-pay | Source: Ambulatory Visit | Attending: Obstetrics & Gynecology | Admitting: Obstetrics & Gynecology

## 2011-05-14 ENCOUNTER — Encounter (HOSPITAL_COMMUNITY): Payer: Self-pay | Admitting: *Deleted

## 2011-05-14 ENCOUNTER — Inpatient Hospital Stay (HOSPITAL_COMMUNITY): Payer: Self-pay

## 2011-05-14 DIAGNOSIS — R1032 Left lower quadrant pain: Secondary | ICD-10-CM | POA: Insufficient documentation

## 2011-05-14 DIAGNOSIS — N83202 Unspecified ovarian cyst, left side: Secondary | ICD-10-CM

## 2011-05-14 DIAGNOSIS — N83209 Unspecified ovarian cyst, unspecified side: Secondary | ICD-10-CM | POA: Insufficient documentation

## 2011-05-14 DIAGNOSIS — R197 Diarrhea, unspecified: Secondary | ICD-10-CM | POA: Insufficient documentation

## 2011-05-14 LAB — WET PREP, GENITAL
Trich, Wet Prep: NONE SEEN
Yeast Wet Prep HPF POC: NONE SEEN

## 2011-05-14 MED ORDER — OXYCODONE-ACETAMINOPHEN 5-325 MG PO TABS: 1.0000 | ORAL_TABLET | Freq: Four times a day (QID) | ORAL | Status: DC | PRN

## 2011-05-14 MED ORDER — OXYCODONE-ACETAMINOPHEN 5-325 MG PO TABS
2.0000 | ORAL_TABLET | Freq: Once | ORAL | Status: AC
  Administered 2011-05-14: 2 via ORAL
  Filled 2011-05-14: qty 2

## 2011-05-14 MED ORDER — DIPHENOXYLATE-ATROPINE 2.5-0.025 MG PO TABS: 1.0000 | ORAL_TABLET | Freq: Four times a day (QID) | ORAL | Status: DC | PRN

## 2011-05-14 NOTE — Progress Notes (Signed)
abd pain, lower left side started yesterday.  Hx of ovarian cyst.  Diarrhea started during the night.  No one else at home having GI problems.

## 2011-05-14 NOTE — ED Provider Notes (Signed)
History     Chief Complaint  Patient presents with  . Abdominal Pain   HPI 31 y.o. G0P0000 with LLQ pain starting yesterday, diarrhea starting last night. No vaginal bleeding or discharge. H/O left ovarian cyst noted in November 2012, had f/u in GYN clinic, cancelled follow up u/s last month. States this feels like the pain she has had in the past with her ovarian cysts.    Past Medical History  Diagnosis Date  . Complication of anesthesia   . Ovarian cyst   . Abnormal Pap smear   . PID (pelvic inflammatory disease)   . Gonorrhea   . Trichomonas   . Anxiety     Past Surgical History  Procedure Date  . Diagnostic laparoscopy May 2012    Family History  Problem Relation Age of Onset  . Anesthesia problems Neg Hx   . Cystic fibrosis Mother   . Hypertension Mother   . Diabetes Father     History  Substance Use Topics  . Smoking status: Current Everyday Smoker -- 0.2 packs/day for 9 years    Types: Cigarettes  . Smokeless tobacco: Never Used  . Alcohol Use: Yes     occ    Allergies:  Allergies  Allergen Reactions  . Naproxen Itching  . Nsaids Itching and Nausea And Vomiting  . Excedrin (Goodys Extra Strength) Itching and Nausea And Vomiting  . Ibuprofen Itching    Prescriptions prior to admission  Medication Sig Dispense Refill  . acetaminophen (TYLENOL) 500 MG tablet Take 2,000 mg by mouth every 6 (six) hours as needed. Patient used this medication for pain.      . famotidine (PEPCID) 20 MG tablet Take 1 tablet (20 mg total) by mouth 2 (two) times daily.  60 tablet  1  . Pseudoeph-CPM-DM-APAP (TYLENOL COLD PO) Take 1 tablet by mouth daily as needed. Patient used this medication for cold symptoms.        Review of Systems  Constitutional: Negative.   Respiratory: Negative.   Cardiovascular: Negative.   Gastrointestinal: Positive for abdominal pain and diarrhea. Negative for nausea, vomiting and constipation.  Genitourinary: Negative for dysuria, urgency,  frequency, hematuria and flank pain.       Negative for vaginal bleeding, vaginal discharge  Musculoskeletal: Negative.   Neurological: Negative.   Psychiatric/Behavioral: Negative.    Physical Exam   Blood pressure 113/73, pulse 72, temperature 99.1 F (37.3 C), temperature source Oral, resp. rate 20, height 5\' 1"  (1.549 m), weight 62.143 kg (137 lb), last menstrual period 04/28/2011, SpO2 98.00%.  Physical Exam  Nursing note and vitals reviewed. Constitutional: She is oriented to person, place, and time. She appears well-developed and well-nourished. No distress.  HENT:  Head: Normocephalic and atraumatic.  Cardiovascular: Normal rate, regular rhythm and normal heart sounds.   Respiratory: Effort normal and breath sounds normal. No respiratory distress.  GI: Soft. Bowel sounds are normal. She exhibits no distension and no mass. There is no tenderness. There is no rebound and no guarding.  Genitourinary: There is no rash or lesion on the right labia. There is no rash or lesion on the left labia. Uterus is tender. Uterus is not deviated, not enlarged and not fixed. Cervix exhibits motion tenderness. Cervix exhibits no discharge and no friability. Right adnexum displays tenderness. Right adnexum displays no mass and no fullness. Left adnexum displays tenderness. Left adnexum displays no mass and no fullness. No erythema, tenderness or bleeding around the vagina. Vaginal discharge (white) found.  Neurological: She  is alert and oriented to person, place, and time.  Skin: Skin is warm and dry.  Psychiatric: She has a normal mood and affect.    [facility] Course  Procedures  Results for orders placed during the hospital encounter of 05/14/11 (from the past 24 hour(s))  WET PREP, GENITAL     Status: Abnormal   Collection Time   05/14/11  2:13 PM      Component Value Range   Yeast Wet Prep HPF POC NONE SEEN  NONE SEEN    Trich, Wet Prep NONE SEEN  NONE SEEN    Clue Cells Wet Prep HPF POC NONE  SEEN  NONE SEEN    WBC, Wet Prep HPF POC FEW (*) NONE SEEN   POCT PREGNANCY, URINE     Status: Normal   Collection Time   05/14/11  3:36 PM      Component Value Range   Preg Test, Ur NEGATIVE  NEGATIVE    Percocet 5/325 #2 given in [facility]   US Transvaginal Non-ob  05/14/2011  *RADIOLOGY REPORT*  Clinical Data: Follow-up left ovarian cyst, left lower quadrant pelvic pain  TRANSVAGINAL ULTRASOUND OF PELVIS  Technique:  Transvaginal ultrasound examination of the pelvis was performed including evaluation of the uterus, ovaries, adnexal regions, and pelvic cul-de-sac.  Comparison:  02/19/2011  Findings:  Uterus:  4.3 x 8.5 x 5.3 cm.  Anteverted, anteflexed.  Anterior uterine body predominately intramural fibroid again noted measuring 2.3 x 2.0 x 1.6 cm.  Posterior uterine fundal intramural fibroid measures 0.9 x 0.9 x 0.7 cm.  Endometrium: Borderline trilaminar in appearance, 11 mm.  No focal abnormality visualized.  Right ovary: 4.5 x 2.9 x 2.1 cm.  Normal.  Left ovary: 5.6 x 4.5 x 4.5 cm.  4.4 x 4.3 x 4.3 cm cyst with lace- like internal reticular echoes is noted.  Other Findings:  Small free fluid noted.  IMPRESSION: Left ovarian cyst.  The imaging characteristics are again most typical for a hemorrhagic cyst.  It is possible that the previously seen cyst resolved and a new cyst is now visualized, or less likely, the previously seen cyst may have persisted.  Does the patient have a history of endometriosis?  Follow-up pelvic ultrasound is recommended in 4-6 weeks during the week immediately following the patient's menses, for optimal timing and visualization.  If the finding persists at that time, MRI with contrast of the pelvis would then be recommended for further characterization.  Original Report Authenticated By: Harrel Lemon, M.D.    Assessment and Plan  31 y.o. G0P0000 with 1) left ovarian cyst, no change since 11/12 - rx percocet, f/u ultrasound in 4-5 weeks, f/u in GYN clinic in 6 weeks. Pt  states she may choose another doctor to follow up with.  2) diarrhea - lomotil prescribed   Taseen Marasigan 05/14/2011, 4:26 PM

## 2011-05-14 NOTE — Progress Notes (Signed)
Pt has had little relief from pain medication. Rates pain 8 out 10. No new orders at this time.

## 2011-05-15 LAB — GC/CHLAMYDIA PROBE AMP, GENITAL
Chlamydia, DNA Probe: NEGATIVE
GC Probe Amp, Genital: NEGATIVE

## 2011-05-17 ENCOUNTER — Telehealth: Payer: Self-pay | Admitting: *Deleted

## 2011-05-17 DIAGNOSIS — N949 Unspecified condition associated with female genital organs and menstrual cycle: Secondary | ICD-10-CM

## 2011-05-17 DIAGNOSIS — N83209 Unspecified ovarian cyst, unspecified side: Secondary | ICD-10-CM

## 2011-05-17 NOTE — Telephone Encounter (Signed)
Called pt re: notes from [place] [facility] for pt's follow up care. I informed her that it is advised that she have a follow up U/S in 4-6 wks and then be seen in the clinic soon after the U/S to discuss the results and plan of care. Pt stated that she has an appt @ clinic tomorrow. I told her that the appt will not be necessary because she had an extensive evaluation @ [facility] on 2/11. Pt states she is out of pain medicine and wants to see the doctor to try to get some answers about her problem. I told pt to come in @ her previously scheduled appt time of 0900. Pt voiced understanding.

## 2011-05-17 NOTE — Telephone Encounter (Signed)
Message copied by Jill Side on Thu May 17, 2011 11:53 AM ------      Message from: Zola Button L      Created: Thu May 17, 2011 11:43 AM      Regarding: RE: [facility] f/u visit       Made follow up appt for 3/28 @ 2:15 pm            Thanks,      Herbert Seta      ----- Message -----         From: Drucilla Schmidt Journii Nierman, RN         Sent: 05/17/2011  11:20 AM           To: Gearldine Bienenstock, RD      Subject: RE: [facility] f/u visit                                        Since pt was seen @ [facility] on 2/11, she will not need the follow up appt tomorrow.  She just needs [place] in 4-5 wks and clinic follow up appt in 6 wks.  If you can make the follow up appt, I will call pt with all appt info.  Please send back to me. Thanks       ----- Message -----         From: Gearldine Bienenstock, RD         Sent: 05/17/2011   8:06 AM           To: [facility] Pool      Subject: FW: [facility] f/u visit                                        We got this message to schedule follow up and that patient needs [place] scheduled.  This patient already has an appt with [place] tomorrow.              Was not sure what needs to be done for patient.                  ----- Message -----         From: Georges Mouse, CNM         Sent: 05/14/2011   5:05 PM           To: [facility] Admin Pool      Subject: [facility] f/u visit                                            Needs follow up visit in 6 weeks for left ovarian cyst following ultrasound appointment. U/S to be scheduled in 4-5 weeks.

## 2011-05-18 ENCOUNTER — Ambulatory Visit (INDEPENDENT_AMBULATORY_CARE_PROVIDER_SITE_OTHER): Payer: Self-pay | Admitting: Obstetrics & Gynecology

## 2011-05-18 ENCOUNTER — Ambulatory Visit: Payer: Self-pay | Admitting: Obstetrics & Gynecology

## 2011-05-18 ENCOUNTER — Encounter: Payer: Self-pay | Admitting: Obstetrics & Gynecology

## 2011-05-18 DIAGNOSIS — N83209 Unspecified ovarian cyst, unspecified side: Secondary | ICD-10-CM

## 2011-05-18 DIAGNOSIS — Z7189 Other specified counseling: Secondary | ICD-10-CM

## 2011-05-18 DIAGNOSIS — N946 Dysmenorrhea, unspecified: Secondary | ICD-10-CM

## 2011-05-18 DIAGNOSIS — Z3009 Encounter for other general counseling and advice on contraception: Secondary | ICD-10-CM

## 2011-05-18 DIAGNOSIS — N949 Unspecified condition associated with female genital organs and menstrual cycle: Secondary | ICD-10-CM

## 2011-05-18 DIAGNOSIS — R102 Pelvic and perineal pain: Secondary | ICD-10-CM

## 2011-05-18 DIAGNOSIS — Z716 Tobacco abuse counseling: Secondary | ICD-10-CM

## 2011-05-18 DIAGNOSIS — Z30013 Encounter for initial prescription of injectable contraceptive: Secondary | ICD-10-CM

## 2011-05-18 LAB — POCT PREGNANCY, URINE: Preg Test, Ur: NEGATIVE

## 2011-05-18 MED ORDER — BUPROPION HCL ER (SR) 150 MG PO TB12: ORAL_TABLET | ORAL | Status: DC

## 2011-05-18 MED ORDER — MEDROXYPROGESTERONE ACETATE 150 MG/ML IM SUSP
150.0000 mg | INTRAMUSCULAR | Status: DC
  Administered 2011-05-18: 150 mg via INTRAMUSCULAR

## 2011-05-18 MED ORDER — OXYCODONE-ACETAMINOPHEN 5-325 MG PO TABS: 1.0000 | ORAL_TABLET | Freq: Four times a day (QID) | ORAL | Status: DC | PRN

## 2011-05-18 NOTE — Patient Instructions (Signed)
Depo Provera/Medroxyprogesterone injection [Contraceptive] What is this medicine? MEDROXYPROGESTERONE (me DROX ee proe JES te rone) contraceptive injections prevent pregnancy. They provide effective birth control for 3 months. Depo-subQ Provera 104 is also used for treating pain related to endometriosis. This medicine may be used for other purposes; ask your health care provider or pharmacist if you have questions. What should I tell my health care provider before I take this medicine? They need to know if you have any of these conditions: -frequently drink alcohol -asthma -blood vessel disease or a history of a blood clot in the lungs or legs -bone disease such as osteoporosis -breast cancer -diabetes -eating disorder (anorexia nervosa or bulimia) -high blood pressure -HIV infection or AIDS -kidney disease -liver disease -mental depression -migraine -seizures (convulsions) -stroke -tobacco smoker -vaginal bleeding -an unusual or allergic reaction to medroxyprogesterone, other hormones, medicines, foods, dyes, or preservatives -pregnant or trying to get pregnant -breast-feeding How should I use this medicine? Depo-Provera Contraceptive injection is given into a muscle. Depo-subQ Provera 104 injection is given under the skin. These injections are given by a health care professional. You must not be pregnant before getting an injection. The injection is usually given during the first 5 days after the start of a menstrual period or 6 weeks after delivery of a baby. Talk to your pediatrician regarding the use of this medicine in children. Special care may be needed. These injections have been used in female children who have started having menstrual periods. Overdosage: If you think you have taken too much of this medicine contact a poison control center or emergency room at once. NOTE: This medicine is only for you. Do not share this medicine with others. What if I miss a dose? Try not  to miss a dose. You must get an injection once every 3 months to maintain birth control. If you cannot keep an appointment, call and reschedule it. If you wait longer than 13 weeks between Depo-Provera contraceptive injections or longer than 14 weeks between Depo-subQ Provera 104 injections, you could get pregnant. Use another method for birth control if you miss your appointment. You may also need a pregnancy test before receiving another injection. What may interact with this medicine? Do not take this medicine with any of the following medications: -bosentan This medicine may also interact with the following medications: -aminoglutethimide -antibiotics or medicines for infections, especially rifampin, rifabutin, rifapentine, and griseofulvin -aprepitant -barbiturate medicines such as phenobarbital or primidone -bexarotene -carbamazepine -medicines for seizures like ethotoin, felbamate, oxcarbazepine, phenytoin, topiramate -modafinil -St. John's wort This list may not describe all possible interactions. Give your health care provider a list of all the medicines, herbs, non-prescription drugs, or dietary supplements you use. Also tell them if you smoke, drink alcohol, or use illegal drugs. Some items may interact with your medicine. What should I watch for while using this medicine? This drug does not protect you against HIV infection (AIDS) or other sexually transmitted diseases. Use of this product may cause you to lose calcium from your bones. Loss of calcium may cause weak bones (osteoporosis). Only use this product for more than 2 years if other forms of birth control are not right for you. The longer you use this product for birth control the more likely you will be at risk for weak bones. Ask your health care professional how you can keep strong bones. You may have a change in bleeding pattern or irregular periods. Many females stop having periods while taking this drug. If you  have  received your injections on time, your chance of being pregnant is very low. If you think you may be pregnant, see your health care professional as soon as possible. Tell your health care professional if you want to get pregnant within the next year. The effect of this medicine may last a long time after you get your last injection. What side effects may I notice from receiving this medicine? Side effects that you should report to your doctor or health care professional as soon as possible: -allergic reactions like skin rash, itching or hives, swelling of the face, lips, or tongue -breast tenderness or discharge -breathing problems -changes in vision -depression -feeling faint or lightheaded, falls -fever -pain in the abdomen, chest, groin, or leg -problems with balance, talking, walking -unusually weak or tired -yellowing of the eyes or skin Side effects that usually do not require medical attention (report to your doctor or health care professional if they continue or are bothersome): -acne -fluid retention and swelling -headache -irregular periods, spotting, or absent periods -temporary pain, itching, or skin reaction at site where injected -weight gain This list may not describe all possible side effects. Call your doctor for medical advice about side effects. You may report side effects to FDA at 1-800-FDA-1088. Where should I keep my medicine? This does not apply. The injection will be given to you by a health care professional. NOTE: This sheet is a summary. It may not cover all possible information. If you have questions about this medicine, talk to your doctor, pharmacist, or health care provider.  2012, [employer]. (04/09/2008 6:37:56 PM)   Pelvic Pain Pelvic pain is pain below the belly button and located between your hips. Acute pain may last a few hours or days. Chronic pelvic pain may last weeks and months. The cause may be different for different types of pain.  The pain may be dull or sharp, mild or severe and can interfere with your daily activities. Write down and tell your caregiver:   Exactly where the pain is located.   If it comes and goes or is there all the time.   When it happens (with sex, urination, bowel movement, etc.)   If the pain is related to your menstrual period or stress.  Your caregiver will take a full history and do a complete physical exam and Pap test. CAUSES   Painful menstrual periods (dysmenorrhea).   Normal ovulation (Mittelschmertz) that occurs in the middle of the menstrual cycle every month.   The pelvic organs get engorged with blood just before the menstrual period (pelvic congestive syndrome).   Scar tissue from an infection or past surgery (pelvic adhesions).   Cancer of the female pelvic organs. When there is pain with cancer, it has been there for a long time.   The lining of the uterus (endometrium) abnormally grows in places like the pelvis and on the pelvic organs (endometriosis).   A form of endometriosis with the lining of the uterus present inside of the muscle tissue of the uterus (adenomyosis).   Fibroid tumor (noncancerous) in the uterus.   Bladder problems such as infection, bladder spasms of the muscle tissue of the bladder.   Intestinal problems (irritable bowel syndrome, colitis, an ulcer or gastrointestinal infection).   Polyps of the cervix or uterus.   Pregnancy in the tube (ectopic pregnancy).   The opening of the cervix is too small for the menstrual blood to flow through it (cervical stenosis).   Physical or sexual  abuse (past or present).   Musculo-skeletal problems from poor posture, problems with the vertebrae of the lower back or the uterine pelvic muscles falling (prolapse).   Psychological problems such as depression or stress.   IUD (intrauterine device) in the uterus.  DIAGNOSIS  Tests to make a diagnosis depends on the type, location, severity and what causes the  pain to occur. Tests that may be needed include:  Blood tests.   Urine tests   Ultrasound.   X-rays.   CT Scan.   MRI.   Laparoscopy.   Major surgery.  TREATMENT  Treatment will depend on the cause of the pain, which includes:  Prescription or over-the-counter pain medication.   Antibiotics.   Birth control pills.   Hormone treatment.   Nerve blocking injections.   Physical therapy.   Antidepressants.   Counseling with a psychiatrist or psychologist.   Minor or major surgery.  HOME CARE INSTRUCTIONS   Only take over-the-counter or prescription medicines for pain, discomfort or fever as directed by your caregiver.   Follow your caregiver's advice to treat your pain.   Rest.   Avoid sexual intercourse if it causes the pain.   Apply warm or cold compresses (which ever works best) to the pain area.   Do relaxation exercises such as yoga or meditation.   Try acupuncture.   Avoid stressful situations.   Try group therapy.   If the pain is because of a stomach/intestinal upset, drink clear liquids, eat a bland light food diet until the symptoms go away.  SEEK MEDICAL CARE IF:   You need stronger prescription pain medication.   You develop pain with sexual intercourse.   You have pain with urination.   You develop a temperature of 102 F (38.9 C) with the pain.   You are still in pain after 4 hours of taking prescription medication for the pain.   You need depression medication.   Your IUD is causing pain and you want it removed.  SEEK IMMEDIATE MEDICAL CARE IF:  You develop very severe pain or tenderness.   You faint, have chills, severe weakness or dehydration.   You develop heavy vaginal bleeding or passing solid tissue.   You develop a temperature of 102 F (38.9 C) with the pain.   You have blood in the urine.   You are being physically or sexually abused.   You have uncontrolled vomiting and diarrhea.   You are depressed and  afraid of harming yourself or someone else.  Document Released: 04/26/2004 Document Revised: 11/29/2010 Document Reviewed: 01/22/2008 [facility] Patient Information 2012 [place], [place].

## 2011-05-19 ENCOUNTER — Encounter: Payer: Self-pay | Admitting: Obstetrics & Gynecology

## 2011-05-19 NOTE — Progress Notes (Signed)
History:  31 y.o. G0P0000 here today for followup of persistent left ovarian cyst seen during her last MAU visit and chronic pelvic pain.  On review of her imaging studies, patient has had left ovarian cysts that have come and go in the past three years.  She underwent a laparoscopy in 06/27/10 that was negative for endometriosis; cysts were thought to be likely hemorrhagic.  Patient also reports debilitating pelvic pain only helped by taking 1/2 to one Percocet; she is allergic to NSAIDs.  She is interested in restarting hormonal therapy to help with the cyst and the pain; she does not want OCPs but prefers Depo Provera.  Patient also desires a prescription for Wellbutrin for smoking cessation.  The following portions of the patient's history were reviewed and updated as appropriate: allergies, current medications, past family history, past medical history, past social history, past surgical history and problem list.   Objective:  Physical Exam Blood pressure 109/69, pulse 69, temperature 99 F (37.2 C), height 4\' 11"  (1.499 m), weight 136 lb 8 oz (61.916 kg), last menstrual period 04/28/2011. Gen: NAD Abd: Soft, mildly tender to palpation and nondistended Pelvic/Bimanual: Normal appearing external genitalia. Diffuse tenderness on pelvic exam, L>R,  Small uterus, some L adnexal tenderness.  Labs and Imaging 05/14/2011  TRANSVAGINAL ULTRASOUND OF PELVIS  Technique:  Transvaginal ultrasound examination of the pelvis was performed including evaluation of the uterus, ovaries, adnexal regions, and pelvic cul-de-sac.  Comparison:  02/19/2011  Findings:  Uterus:  4.3 x 8.5 x 5.3 cm.  Anteverted, anteflexed.  Anterior uterine body predominately intramural fibroid again noted measuring 2.3 x 2.0 x 1.6 cm.  Posterior uterine fundal intramural fibroid measures 0.9 x 0.9 x 0.7 cm.  Endometrium: Borderline trilaminar in appearance, 11 mm.  No focal abnormality visualized.  Right ovary: 4.5 x 2.9 x 2.1 cm.  Normal.   Left ovary: 5.6 x 4.5 x 4.5 cm.  4.4 x 4.3 x 4.3 cm cyst with lace- like internal reticular echoes is noted.  Other Findings:  Small free fluid noted.  IMPRESSION: Left ovarian cyst.  The imaging characteristics are again most typical for a hemorrhagic cyst.  It is possible that the previously seen cyst resolved and a new cyst is now visualized, or less likely, the previously seen cyst may have persisted.  Does the patient have a history of endometriosis?  Follow-up pelvic ultrasound is recommended in 4-6 weeks during the week immediately following the patient's menses, for optimal timing and visualization.  If the finding persists at that time, MRI with contrast of the pelvis would then be recommended for further characterization.  Original Report Authenticated By: Harrel Lemon, M.D.    Assessment & Plan:   UHCG negative; will give Depo Provera today.  If pain/cysts persists, may need surgical management. Bupropion also e-prescribed and Rx for Percocet given. Patient was given information about free smoking cessation classes at the [facility] Informed of free pap smear screening opportunities Return to clinic in one month for follow up evaluation

## 2011-06-15 ENCOUNTER — Telehealth: Payer: Self-pay | Admitting: *Deleted

## 2011-06-15 NOTE — Telephone Encounter (Signed)
Called and left message that I was returning her call. She may leave a new message if desired and we will call back next week.

## 2011-06-15 NOTE — Telephone Encounter (Signed)
Pt left message that she had received Depo injection on 05/18/11, period started 6 days ago and is getting heavier. Please call back.

## 2011-06-18 ENCOUNTER — Ambulatory Visit (HOSPITAL_COMMUNITY): Payer: Self-pay

## 2011-06-18 NOTE — Telephone Encounter (Signed)
I called pt and notified her of appt w/Dr. Macon Large on 06/20/11 @ 1315. Pt agreed and voiced understanding.

## 2011-06-18 NOTE — Telephone Encounter (Signed)
Pt returned my call this morning and stated that she is having problems after the Depo injection and has questions.  In talking with her she states that she began a period on 3/6 which started out light and than became heavy. She has been having heavy bleeding for >7 days and is using pads and tampons. She has also passed several clots and something very large on 3/15 which was the size of the palm of her hand. She took pictures of this because she states it did not look completely like a blood clot. She is very concerned about the length of time she has been bleeding. I informed pt that the length of time is not abnormal and after having Depo, she can have abnormal bleeding for several months. I described the type of bleeding which would warrant a visit to MAU.  I also stated that she should go to MAU if she becomes dizzy, lightheaded or has severe weakness. I observed that pt has [place] appt this morning @ 1015, but pt states she did not know anything about the appt. I stated that I will cancel the appt since her bleeding is too heavy for the exam anyway. Dr. Macon Large will evaluate the need for follow up US @ next appt- pt agreed. Pt stated that she would like to be seen sooner than 3/28 as scheduled because she is very concerned about her bleeding and the tissue that she passed. I told pt that I will check the availability and call her back. Pt voiced understanding.

## 2011-06-20 ENCOUNTER — Ambulatory Visit: Payer: Self-pay | Admitting: Obstetrics & Gynecology

## 2011-06-28 ENCOUNTER — Ambulatory Visit: Payer: Self-pay | Admitting: Obstetrics & Gynecology

## 2011-08-15 ENCOUNTER — Inpatient Hospital Stay (HOSPITAL_COMMUNITY)
Admission: AD | Admit: 2011-08-15 | Discharge: 2011-08-15 | Disposition: A | Discharge status: Home / Self Care | Payer: Self-pay | Source: Ambulatory Visit | Attending: Obstetrics & Gynecology | Admitting: Obstetrics & Gynecology

## 2011-08-15 ENCOUNTER — Encounter (HOSPITAL_COMMUNITY): Payer: Self-pay | Admitting: *Deleted

## 2011-08-15 ENCOUNTER — Inpatient Hospital Stay (HOSPITAL_COMMUNITY): Payer: Self-pay

## 2011-08-15 DIAGNOSIS — R1032 Left lower quadrant pain: Secondary | ICD-10-CM | POA: Insufficient documentation

## 2011-08-15 LAB — URINALYSIS, ROUTINE W REFLEX MICROSCOPIC
Glucose, UA: NEGATIVE mg/dL
Specific Gravity, Urine: >1.03 — ABNORMAL HIGH (ref 1.005–1.030)
Urobilinogen, UA: 0.2 mg/dL (ref 0.0–1.0)

## 2011-08-15 LAB — URINE MICROSCOPIC-ADD ON

## 2011-08-15 LAB — POCT PREGNANCY, URINE: Preg Test, Ur: NEGATIVE

## 2011-08-15 LAB — WET PREP, GENITAL: Yeast Wet Prep HPF POC: NONE SEEN

## 2011-08-15 MED ORDER — OXYCODONE-ACETAMINOPHEN 5-325 MG PO TABS
1.0000 | ORAL_TABLET | Freq: Once | ORAL | Status: AC
  Administered 2011-08-15: 1 via ORAL
  Filled 2011-08-15: qty 1

## 2011-08-15 MED ORDER — ONDANSETRON 8 MG PO TBDP
8.0000 mg | ORAL_TABLET | Freq: Once | ORAL | Status: AC
  Administered 2011-08-15: 8 mg via ORAL
  Filled 2011-08-15: qty 1

## 2011-08-15 NOTE — MAU Note (Signed)
Patient states she has a known ovarian cyst on the left side. Was seen at the [facility] in February and has her next appointment next week but the pain is to bad. Last Depo injection was 2-15 and scheduled for next dose at next week appointment. Has been having pain for about one week but became worse last night. Has a little spotting and a yellow/clear vaginal discharge with odor.

## 2011-08-15 NOTE — Discharge Instructions (Signed)
Keep appointment in GYN clinic and to continue Depo. May need to follow up with Urgent Care if GI symptoms continue. Drink at least 8 8-oz glasses of water every day. Take Tylenol 325 mg 2 tablets by mouth every 4 hours if needed for pain.

## 2011-08-15 NOTE — MAU Provider Note (Signed)
History     CSN: [id number]  Arrival date and time: 08/15/11 7829   First Provider Initiated Contact with Patient 08/15/11 [phone]      Chief Complaint  Patient presents with  . Abdominal Pain   HPI [name] [name] 31 y.o. Client of [facility] clinic.  Was seen last in March and given Depo.  Has a left ovarian cyst and today awakened with severe pain at 3 am.  Does not have any more pain medication.  Has an appointment 08/22/11 to be seen again in the clinic.  OB History    Grav Para Term Preterm Abortions TAB SAB Ect Mult Living   3 0 0 0 3 0 3 0 0 0       Past Medical History  Diagnosis Date  . Complication of anesthesia   . Ovarian cyst   . Abnormal Pap smear   . PID (pelvic inflammatory disease)   . Gonorrhea   . Trichomonas   . Anxiety     Past Surgical History  Procedure Date  . Diagnostic laparoscopy May 2012    Family History  Problem Relation Age of Onset  . Anesthesia problems Neg Hx   . Cystic fibrosis Mother   . Hypertension Mother   . Diabetes Father     History  Substance Use Topics  . Smoking status: Current Everyday Smoker -- 0.2 packs/day for 9 years    Types: Cigarettes  . Smokeless tobacco: Never Used  . Alcohol Use: Yes     occ    Allergies:  Allergies  Allergen Reactions  . Naproxen Itching  . Nsaids Itching and Nausea And Vomiting  . Excedrin (Aspirin-Acetaminophen-Caffeine) Itching and Nausea And Vomiting  . Ibuprofen Itching and Other (See Comments)    Also causes headaches and personality changes.    Prescriptions prior to admission  Medication Sig Dispense Refill  . acetaminophen (TYLENOL) 500 MG tablet Take 2,000 mg by mouth every 6 (six) hours as needed. Patient used this medication for pain.      . IRON PO Take 1 tablet by mouth daily.      . Prenatal Vit-Fe Fumarate-FA (PRENATAL MULTIVITAMIN) TABS Take 1 tablet by mouth daily.      . vitamin B-12 (CYANOCOBALAMIN) 100 MCG tablet Take 100 mcg by mouth daily.        Review  of Systems  Constitutional: Negative for fever.  Gastrointestinal: Positive for nausea and abdominal pain. Negative for diarrhea and constipation.  Genitourinary: Negative for dysuria.       Vaginal discharge   Physical Exam   Blood pressure 133/78, pulse 76, temperature 98.3 F (36.8 C), temperature source Oral, resp. rate 18, height 5' 0.5" (1.537 m), weight 136 lb 6.4 oz (61.871 kg), SpO2 100.00%.  Physical Exam  Nursing note and vitals reviewed. Constitutional: She is oriented to person, place, and time. She appears well-developed and well-nourished.  HENT:  Head: Normocephalic.  Eyes: EOM are normal.  Neck: Neck supple.  GI: Soft. There is tenderness. There is guarding. There is no rebound.       Bowel sounds hyperactive in all quadrants  Musculoskeletal: Normal range of motion.  Neurological: She is alert and oriented to person, place, and time.  Skin: Skin is warm and dry.  Psychiatric: She has a normal mood and affect.  Vomited x one in MAU  MAU Course  Procedures  MDM Clinical Data: Follow-up complex left ovarian cyst. Pelvic pain.  TRANSVAGINAL ULTRASOUND OF PELVIS  Technique: Transvaginal ultrasound examination  of the pelvis was  performed including evaluation of the uterus, ovaries, adnexal  regions, and pelvic cul-de-sac.  Comparison: 05/14/2011  Findings:  Uterus: 8.2 x 5.3 x 5.9 cm. Several small uterine fibroids are  again seen, largest which is subserosal in location in the anterior  corpus measuring 4.1 cm.  Endometrium: Double layer thickness measures 7 mm  transvaginally.  Right Ovary: 4.6 x 2.6 x 3.2 cm. Normal appearance.  Left Ovary: 4.2 x 2.8 x 3.2 cm. Normal appearance. Previously  seen complex/hemorrhagic cyst has now resolved.  Other Findings: No free fluid.  IMPRESSION:  1. Normal ovaries. Resolution of left ovarian hemorrhagic cyst  since prior exam.  2. Stable small uterine fibroids, largest measuring 4 cm.   Results for orders placed  during the hospital encounter of 08/15/11 (from the past 24 hour(s))  URINALYSIS, ROUTINE W REFLEX MICROSCOPIC     Status: Abnormal   Collection Time   08/15/11  9:25 AM      Component Value Range   Color, Urine YELLOW  YELLOW    APPearance CLEAR  CLEAR    Specific Gravity, Urine >1.030 (*) 1.005 - 1.030    pH 5.5  5.0 - 8.0    Glucose, UA NEGATIVE  NEGATIVE (mg/dL)   Hgb urine dipstick TRACE (*) NEGATIVE    Bilirubin Urine NEGATIVE  NEGATIVE    Ketones, ur NEGATIVE  NEGATIVE (mg/dL)   Protein, ur NEGATIVE  NEGATIVE (mg/dL)   Urobilinogen, UA 0.2  0.0 - 1.0 (mg/dL)   Nitrite NEGATIVE  NEGATIVE    Leukocytes, UA NEGATIVE  NEGATIVE   URINE MICROSCOPIC-ADD ON     Status: Normal   Collection Time   08/15/11  9:25 AM      Component Value Range   Squamous Epithelial / LPF RARE  RARE    WBC, UA 0-2  <3 (WBC/hpf)   RBC / HPF 0-2  <3 (RBC/hpf)   Bacteria, UA RARE  RARE   POCT PREGNANCY, URINE     Status: Normal   Collection Time   08/15/11  9:31 AM      Component Value Range   Preg Test, Ur NEGATIVE  NEGATIVE   WET PREP, GENITAL     Status: Abnormal   Collection Time   08/15/11 10:58 AM      Component Value Range   Yeast Wet Prep HPF POC NONE SEEN  NONE SEEN    Trich, Wet Prep NONE SEEN  NONE SEEN    Clue Cells Wet Prep HPF POC NONE SEEN  NONE SEEN    WBC, Wet Prep HPF POC FEW (*) NONE SEEN     Assessment and Plan  LLQ pain  - unknown cause, no ovarian cyst  Plan Pain decreased with Percocet Advised to continue Tylenol at home Advised BRAT diet since she had vomiting earlier - possible GI upset Advised to keep appointment in GYN clinic and to continue Depo. May need to follow up with Urgent Care if GI symptoms continue.  [name] [name] 08/15/2011, 11:05 AM

## 2011-08-15 NOTE — MAU Note (Signed)
Pt in c/o left sided lower abdominal pain.  States she has a known left ovarian cyst.  Started hurting a week ago, progressively worsening, woke up this morning at 0345 with throbbing, sharp pains.  Is seen in [facility], has appt on 5/22 for next depo shot.   Reports spotting and a clear, yellowish discharge with foul odor.

## 2011-08-22 ENCOUNTER — Ambulatory Visit (INDEPENDENT_AMBULATORY_CARE_PROVIDER_SITE_OTHER): Payer: Self-pay | Admitting: Obstetrics & Gynecology

## 2011-08-22 ENCOUNTER — Encounter: Payer: Self-pay | Admitting: Obstetrics & Gynecology

## 2011-08-22 VITALS — BP 110/73 | HR 86 | Temp 97.7°F | Resp 16 | Ht 60.0 in | Wt 133.8 lb

## 2011-08-22 DIAGNOSIS — Z3049 Encounter for surveillance of other contraceptives: Secondary | ICD-10-CM

## 2011-08-22 MED ORDER — MEDROXYPROGESTERONE ACETATE 150 MG/ML IM SUSP
150.0000 mg | INTRAMUSCULAR | Status: AC
  Administered 2011-08-22: 150 mg via INTRAMUSCULAR

## 2011-08-22 MED ORDER — OXYCODONE-ACETAMINOPHEN 5-325 MG PO TABS: 1.0000 | ORAL_TABLET | Freq: Four times a day (QID) | ORAL | Status: DC | PRN

## 2011-08-22 NOTE — Patient Instructions (Signed)
Return to clinic for any scheduled appointments or for any gynecologic concerns as needed.   

## 2011-08-22 NOTE — Progress Notes (Signed)
History:  31 y.o. G3P0030 here today for repeat Depo Provera injection for chronic pelvic pain and persistent ovarian cysts.  Still has occasional severe pain, wants refill of Percocet.  No other concerns.  Feels pain is improved on the Depo Provera.  The following portions of the patient's history were reviewed and updated as appropriate: allergies, current medications, past family history, past medical history, past social history, past surgical history and problem list.  Review of Systems:  Pertinent items are noted in HPI.  Objective:  Physical Exam Blood pressure 110/73, pulse 86, temperature 97.7 F (36.5 C), temperature source Oral, resp. rate 16, height 5' (1.524 m), weight 133 lb 12.8 oz (60.691 kg), last menstrual period 08/22/2011. Deferred  Labs and Imaging 08/15/2011  TRANSVAGINAL ULTRASOUND OF PELVIS  Clinical Data: Follow-up complex left ovarian cyst.  Pelvic pain.    Technique:  Transvaginal ultrasound examination of the pelvis was performed including evaluation of the uterus, ovaries, adnexal regions, and pelvic cul-de-sac.  Comparison:  05/14/2011  Findings:  Uterus:     8.2 x 5.3 x 5.9 cm.  Several small uterine fibroids are again seen, largest which is subserosal in location in the anterior corpus measuring 4.1 cm.  Endometrium:      Double layer thickness measures 7 mm transvaginally.  Right Ovary: 4.6 x 2.6 x 3.2 cm.  Normal appearance.  Left Ovary: 4.2 x 2.8 x 3.2 cm.  Normal appearance.  Previously seen complex/hemorrhagic cyst has now resolved.  Other Findings:  No free fluid.  IMPRESSION:  1.  Normal ovaries.  Resolution of left ovarian hemorrhagic cyst since prior exam. 2.  Stable small uterine fibroids, largest measuring 4 cm.  Original Report Authenticated By: Danae Orleans, M.D.   Assessment & Plan:  Percocet refilled Will continue Depo Provera q 3 months. Return for worsening symptoms.

## 2011-11-07 ENCOUNTER — Ambulatory Visit: Payer: Self-pay

## 2012-05-07 ENCOUNTER — Inpatient Hospital Stay (HOSPITAL_COMMUNITY)
Admission: AD | Admit: 2012-05-07 | Discharge: 2012-05-07 | Disposition: A | Discharge status: Home / Self Care | Payer: No Typology Code available for payment source | Source: Ambulatory Visit | Attending: Obstetrics and Gynecology | Admitting: Obstetrics and Gynecology

## 2012-05-07 ENCOUNTER — Inpatient Hospital Stay (HOSPITAL_COMMUNITY): Payer: No Typology Code available for payment source

## 2012-05-07 ENCOUNTER — Encounter (HOSPITAL_COMMUNITY): Payer: Self-pay

## 2012-05-07 DIAGNOSIS — A499 Bacterial infection, unspecified: Secondary | ICD-10-CM | POA: Insufficient documentation

## 2012-05-07 DIAGNOSIS — B9689 Other specified bacterial agents as the cause of diseases classified elsewhere: Secondary | ICD-10-CM | POA: Insufficient documentation

## 2012-05-07 DIAGNOSIS — R109 Unspecified abdominal pain: Secondary | ICD-10-CM | POA: Insufficient documentation

## 2012-05-07 DIAGNOSIS — N949 Unspecified condition associated with female genital organs and menstrual cycle: Secondary | ICD-10-CM | POA: Insufficient documentation

## 2012-05-07 DIAGNOSIS — N83209 Unspecified ovarian cyst, unspecified side: Secondary | ICD-10-CM

## 2012-05-07 DIAGNOSIS — D259 Leiomyoma of uterus, unspecified: Secondary | ICD-10-CM | POA: Insufficient documentation

## 2012-05-07 DIAGNOSIS — N760 Acute vaginitis: Secondary | ICD-10-CM | POA: Insufficient documentation

## 2012-05-07 LAB — WET PREP, GENITAL
Trich, Wet Prep: NONE SEEN
Yeast Wet Prep HPF POC: NONE SEEN

## 2012-05-07 LAB — POCT PREGNANCY, URINE: Preg Test, Ur: NEGATIVE

## 2012-05-07 LAB — URINALYSIS, ROUTINE W REFLEX MICROSCOPIC
Glucose, UA: NEGATIVE mg/dL
Leukocytes, UA: NEGATIVE
Nitrite: NEGATIVE
Specific Gravity, Urine: 1.015 (ref 1.005–1.030)
pH: 6 (ref 5.0–8.0)

## 2012-05-07 LAB — CBC
HCT: 37.8 % (ref 36.0–46.0)
Hemoglobin: 12.8 g/dL (ref 12.0–15.0)
MCH: 27.8 pg (ref 26.0–34.0)
MCHC: 33.9 g/dL (ref 30.0–36.0)
MCV: 82.2 fL (ref 78.0–100.0)
RDW: 13.7 % (ref 11.5–15.5)

## 2012-05-07 MED ORDER — HYDROCODONE-ACETAMINOPHEN 5-325 MG PO TABS: 1.0000 | ORAL_TABLET | Freq: Four times a day (QID) | ORAL | Status: DC | PRN

## 2012-05-07 MED ORDER — HYDROCODONE-ACETAMINOPHEN 5-325 MG PO TABS
1.0000 | ORAL_TABLET | Freq: Once | ORAL | Status: AC
  Administered 2012-05-07: 1 via ORAL
  Filled 2012-05-07: qty 1

## 2012-05-07 MED ORDER — METRONIDAZOLE 500 MG PO TABS: 500.0000 mg | ORAL_TABLET | Freq: Two times a day (BID) | ORAL | Status: DC

## 2012-05-07 NOTE — MAU Note (Addendum)
Been having a lot of pelvic and abd pain, started yesterday, having a lot of discharge.  Hx of same, hx of fibroids and ruptured cyst

## 2012-05-07 NOTE — MAU Provider Note (Signed)
History     CSN: [id number]  Arrival date and time: 05/07/12 1432   First Provider Initiated Contact with Patient 05/07/12 1629      Chief Complaint  Patient presents with  . Abdominal Pain  . Pelvic Pain   HPI [name] is a 32 y.o. G3P0030 who presents to MAU today with complaint of pelvic pain. The patient states that she has had lower abdominal pain x1-2 days. She often has abdominal pain and has a history of fibroids and ovarian cysts. The patient was evaluated for these last year around this time and started on Depo. She chose to D/C depo because she wanted to try to have a baby. She is interested in restarting the depo because it helped a lot with her periods and pain. She was last seen in the clinic at [facility] in August 2013. The patient states a long history of prolonged and irregular periods. She denies N/V or fever today. She does have an increased amount of white discharge recently without odor.   OB History    Grav Para Term Preterm Abortions TAB SAB Ect Mult Living   3 0 0 0 3 0 3 0 0 0       Past Medical History  Diagnosis Date  . Complication of anesthesia   . Ovarian cyst   . Abnormal Pap smear   . PID (pelvic inflammatory disease)   . Gonorrhea   . Trichomonas   . Anxiety     Past Surgical History  Procedure Date  . Diagnostic laparoscopy May 2012    Family History  Problem Relation Age of Onset  . Anesthesia problems Neg Hx   . Cystic fibrosis Mother   . Hypertension Mother   . Diabetes Father     History  Substance Use Topics  . Smoking status: Current Every Day Smoker -- 0.5 packs/day for 9 years    Types: Cigarettes  . Smokeless tobacco: Never Used  . Alcohol Use: Yes     Comment: occ    Allergies:  Allergies  Allergen Reactions  . Naproxen Itching  . Nsaids Itching and Nausea And Vomiting  . Percocet (Oxycodone-Acetaminophen) Itching and Rash  . Bc Powder (Aspirin-Salicylamide-Caffeine)   . Excedrin  (Aspirin-Acetaminophen-Caffeine) Itching and Nausea And Vomiting  . Ibuprofen Itching and Other (See Comments)    Also causes headaches and personality changes.    No prescriptions prior to admission    Review of Systems  Constitutional: Negative for fever and chills.  Gastrointestinal: Positive for abdominal pain. Negative for nausea, vomiting, diarrhea and constipation.  Genitourinary: Negative for dysuria, urgency and frequency.       + irregular periods  Neurological: Negative for weakness.   Physical Exam   Blood pressure 122/70, pulse 77, temperature 98 F (36.7 C), temperature source Oral, resp. rate 20, height 5\' 1"  (1.549 m), weight 145 lb (65.772 kg), last menstrual period 04/15/2012.  Physical Exam  Constitutional: She is oriented to person, place, and time. She appears well-developed and well-nourished. No distress.  HENT:  Head: Normocephalic.  Cardiovascular: Normal rate, regular rhythm and normal heart sounds.   Respiratory: Effort normal and breath sounds normal. No respiratory distress.  GI: Bowel sounds are normal. She exhibits no distension and no mass. There is tenderness (diffuse tenderness to palption, more prominent at the LLQ). There is no rebound and no guarding.  Genitourinary: Vagina normal. Uterus is enlarged and tender. Cervix exhibits discharge (thin, white discharge noted at the cervical os and  in the vagina). Cervix exhibits no motion tenderness and no friability. Right adnexum displays no mass and no tenderness. Left adnexum displays tenderness. Left adnexum displays no mass.  Neurological: She is alert and oriented to person, place, and time.  Skin: Skin is warm and dry. No erythema.  Psychiatric: She has a normal mood and affect.   Results for orders placed during the hospital encounter of 05/07/12 (from the past 24 hour(s))  URINALYSIS, ROUTINE W REFLEX MICROSCOPIC     Status: Normal   Collection Time   05/07/12  3:25 PM      Component Value Range    Color, Urine YELLOW  YELLOW   APPearance CLEAR  CLEAR   Specific Gravity, Urine 1.015  1.005 - 1.030   pH 6.0  5.0 - 8.0   Glucose, UA NEGATIVE  NEGATIVE mg/dL   Hgb urine dipstick NEGATIVE  NEGATIVE   Bilirubin Urine NEGATIVE  NEGATIVE   Ketones, ur NEGATIVE  NEGATIVE mg/dL   Protein, ur NEGATIVE  NEGATIVE mg/dL   Urobilinogen, UA 0.2  0.0 - 1.0 mg/dL   Nitrite NEGATIVE  NEGATIVE   Leukocytes, UA NEGATIVE  NEGATIVE  POCT PREGNANCY, URINE     Status: Normal   Collection Time   05/07/12  3:38 PM      Component Value Range   Preg Test, Ur NEGATIVE  NEGATIVE  WET PREP, GENITAL     Status: Abnormal   Collection Time   05/07/12  4:25 PM      Component Value Range   Yeast Wet Prep HPF POC NONE SEEN  NONE SEEN   Trich, Wet Prep NONE SEEN  NONE SEEN   Clue Cells Wet Prep HPF POC FEW (*) NONE SEEN   WBC, Wet Prep HPF POC FEW (*) NONE SEEN  CBC     Status: Normal   Collection Time   05/07/12  4:50 PM      Component Value Range   WBC 5.8  4.0 - 10.5 K/uL   RBC 4.60  3.87 - 5.11 MIL/uL   Hemoglobin 12.8  12.0 - 15.0 g/dL   HCT 16.1  09.6 - 04.5 %   MCV 82.2  78.0 - 100.0 fL   MCH 27.8  26.0 - 34.0 pg   MCHC 33.9  30.0 - 36.0 g/dL   RDW 40.9  81.1 - 91.4 %   Platelets 316  150 - 400 K/uL   *RADIOLOGY REPORT*   Clinical Data: Pelvic pain. Fibroids. Prior ovarian cysts.   TRANSABDOMINAL AND TRANSVAGINAL ULTRASOUND OF PELVIS   Technique: Both transabdominal and transvaginal ultrasound  examinations of the pelvis were performed. Transabdominal  technique was performed for global imaging of the pelvis including  uterus, ovaries, adnexal regions, and pelvic cul-de-sac.  It was necessary to proceed with endovaginal exam following the  transabdominal exam to visualize the complex cystic lesion in the  left adnexa.   Comparison: 08/15/2011   Findings:  Uterus: 8.0 x 5.2 x 5.3 cm. Several small fibroids are seen in  the right uterine fundus and upper corpus which the measured 2.4 cm   to 3.4 cm in size. These show no significant change since previous  study.  Endometrium: Double layer thickness measures 17 mm transvaginally.  No focal lesion visualized.  Right ovary: 4.5 x 2.7 x 3.1 cm. Normal appearance. No adnexal  mass identified.  Left ovary: 5.2 x 5.0 x 4.9 cm. A new complex cyst with fine  reticular internal echoes and no internal blood  flow is seen which  measures of 4.6 x 3.7 x 4.1 cm. This has characteristic of a  benign hemorrhagic cyst.  Other Findings: No free fluid   IMPRESSION:  1. 4.6 cm left ovarian hemorrhagic cyst with benign  characteristics.  2. Stable small uterine fibroids, largest measuring 3.4 cm.   Original Report Authenticated By: Myles Rosenthal, M.D.  MAU Course  Procedures None  Assessment and Plan  A: 4.6 cm left hemorrhagic ovarian cyst Small fibroids, unchanged from previous Bacterial Vaginosis  P: Discharge home Patient is a current clinic patient and will call to make an appointment for follow-up as soon as possible Patient interested in restarting depo provera for symptom management, will discuss at clinic follow-up Rx for Flagyl and Vicodin given to patient Patient instructed to return to MAU if she has significant worsening of her pain as this could be a result of ovarian torsion from the cyst Discussed hygiene products and probiotics for avoiding recurrent BV Patient may return to MAU as needed or if her condition should change or worsen  Freddi Starr, PA-C 05/07/2012, 7:18 PM

## 2012-05-08 LAB — GC/CHLAMYDIA PROBE AMP: GC Probe RNA: NEGATIVE

## 2012-05-08 NOTE — MAU Provider Note (Signed)
Attestation of Attending Supervision of Advanced Practitioner (CNM/NP): Evaluation and management procedures were performed by the Advanced Practitioner under my supervision and collaboration.  I have reviewed the Advanced Practitioner's note and chart, and I agree with the management and plan.  Wanda Rideout 05/08/2012 9:11 AM

## 2012-05-22 ENCOUNTER — Encounter: Payer: Self-pay | Admitting: Obstetrics & Gynecology

## 2012-05-22 ENCOUNTER — Ambulatory Visit (INDEPENDENT_AMBULATORY_CARE_PROVIDER_SITE_OTHER): Payer: Self-pay | Admitting: Obstetrics & Gynecology

## 2012-05-22 VITALS — BP 122/87 | HR 78 | Temp 98.1°F | Ht 59.0 in | Wt 144.4 lb

## 2012-05-22 DIAGNOSIS — N83209 Unspecified ovarian cyst, unspecified side: Secondary | ICD-10-CM

## 2012-05-22 DIAGNOSIS — Z3049 Encounter for surveillance of other contraceptives: Secondary | ICD-10-CM

## 2012-05-22 MED ORDER — MEDROXYPROGESTERONE ACETATE 150 MG/ML IM SUSP
150.0000 mg | Freq: Once | INTRAMUSCULAR | Status: AC
  Administered 2012-05-22: 150 mg via INTRAMUSCULAR

## 2012-05-22 MED ORDER — HYDROCODONE-ACETAMINOPHEN 5-325 MG PO TABS: 1.0000 | ORAL_TABLET | Freq: Four times a day (QID) | ORAL | Status: DC | PRN

## 2012-05-22 NOTE — Progress Notes (Signed)
History:  32 y.o. G3P0030 here today for recurrent left ovarian cyst causing significant pain. Seen in MAU on 05/07/12.  On review of her ultrasounds, she has history of benign physiologic left ovarian cysts about 4 -5 cm in size which come and go since 2003. Still reports significant pain. She does report missing a few doses of prescribed Depo Provera, interested in restarting it today.  The following portions of the patient's history were reviewed and updated as appropriate: allergies, current medications, past family history, past medical history, past social history, past surgical history and problem list.  Review of Systems:  Pertinent items are noted in HPI.  Objective:  Physical Exam BP 122/87  Pulse 78  Temp(Src) 98.1 F (36.7 C) (Oral)  Ht 4\' 11"  (1.499 m)  Wt 144 lb 6.4 oz (65.499 kg)  BMI 29.15 kg/m2  LMP 04/17/2012 Gen: NAD Abd: Soft, nondistended, moderate LLQ tenderness, no guarding, no rebound  Pelvic: Deferred  Labs and Imaging 05/07/2012   TRANSABDOMINAL AND TRANSVAGINAL ULTRASOUND OF PELVIS Clinical Data: Pelvic pain.  Fibroids.  Prior ovarian cysts.   Technique:  Both transabdominal and transvaginal ultrasound examinations of the pelvis were performed.  Transabdominal technique was performed for global imaging of the pelvis including uterus, ovaries, adnexal regions, and pelvic cul-de-sac.  It was necessary to proceed with endovaginal exam following the transabdominal exam to visualize the complex cystic lesion in the left adnexa.  Comparison:  08/15/2011  Findings: Uterus:  8.0 x 5.2 x 5.3 cm.  Several small fibroids are seen in the right uterine fundus and upper corpus which the measured 2.4 cm to 3.4 cm in size.  These show no significant change since previous study.  Endometrium: Double layer thickness measures 17 mm transvaginally. No focal lesion visualized.  Right ovary: 4.5 x 2.7 x 3.1 cm.  Normal appearance.  No adnexal mass identified.  Left ovary: 5.2 x 5.0 x 4.9 cm.  A new complex cyst with fine reticular internal echoes and no internal blood flow is seen which measures of 4.6 x 3.7 x 4.1 cm.  This has characteristic of a benign hemorrhagic cyst.  Other Findings:  No free fluid  IMPRESSION:  1.  4.6 cm left ovarian hemorrhagic cyst with benign characteristics. 2.  Stable small uterine fibroids, largest measuring 3.4 cm.   Original Report Authenticated By: Myles Rosenthal, M.D.     Assessment & Plan:  Vicodin refilled Depo Provera given Patient to consider possible laparoscopic LSO given recurrent left ovarian cyst for over one decade Pain precautions reviewed.

## 2012-05-22 NOTE — Patient Instructions (Signed)
Return to clinic for any scheduled appointments or for any gynecologic concerns as needed.   

## 2012-05-23 ENCOUNTER — Telehealth: Payer: Self-pay | Admitting: *Deleted

## 2012-05-23 NOTE — Telephone Encounter (Signed)
Pt left message stating that she has decided she would like to have surgery performed as discussed with Dr. Macon Large yesterday. She would like the surgery as soon as possible as she has school starting back up on 07/07/12. Please call back with additional information. I called pt and informed her that I will send a message to Dr. Macon Large to have the surgery scheduled. She will receive information by mail of the surgery date and time. I also provided Andriea with the tel # for [place] @ Ob-Gyn office if she has questions about the surgery appointment. Pt voiced understanding.

## 2012-05-27 ENCOUNTER — Encounter: Payer: Self-pay | Admitting: Obstetrics & Gynecology

## 2012-05-27 ENCOUNTER — Telehealth: Payer: Self-pay | Admitting: *Deleted

## 2012-05-27 NOTE — Telephone Encounter (Addendum)
Pt left message stating that she is in a lot of pain. She also has questions about her date for surgery. Please call back.  I returned pt's call and she stated that she has talked with [place] and her surgery is now scheduled. She states that the pain med is making her constipated. We discussed dietary measures (bran cereal, prune juice, increased po fluids/water) as well as stool softener (colace).  Pt voiced understanding.

## 2012-06-04 ENCOUNTER — Other Ambulatory Visit: Payer: Self-pay | Admitting: Obstetrics & Gynecology

## 2012-06-05 ENCOUNTER — Other Ambulatory Visit: Payer: Self-pay | Admitting: Obstetrics & Gynecology

## 2012-06-05 ENCOUNTER — Telehealth: Payer: Self-pay | Admitting: *Deleted

## 2012-06-05 NOTE — Telephone Encounter (Signed)
Pt left message stating that she called her pharmacy for a Rx refill and was told she would need to contact our office. I called the pharmacy and was told that her refill has been authorized and she may pick it up.  Pt was informed and had no further questions.

## 2012-06-09 ENCOUNTER — Encounter (HOSPITAL_COMMUNITY): Payer: Self-pay

## 2012-06-12 ENCOUNTER — Encounter: Payer: Self-pay | Admitting: Obstetrics & Gynecology

## 2012-06-12 ENCOUNTER — Ambulatory Visit (INDEPENDENT_AMBULATORY_CARE_PROVIDER_SITE_OTHER): Payer: Self-pay | Admitting: Obstetrics & Gynecology

## 2012-06-12 VITALS — BP 105/72 | HR 80 | Temp 98.4°F | Ht 59.0 in | Wt 141.6 lb

## 2012-06-12 DIAGNOSIS — N979 Female infertility, unspecified: Secondary | ICD-10-CM

## 2012-06-12 DIAGNOSIS — N83209 Unspecified ovarian cyst, unspecified side: Secondary | ICD-10-CM

## 2012-06-12 DIAGNOSIS — N949 Unspecified condition associated with female genital organs and menstrual cycle: Secondary | ICD-10-CM

## 2012-06-12 DIAGNOSIS — R102 Pelvic and perineal pain: Secondary | ICD-10-CM

## 2012-06-12 NOTE — Progress Notes (Signed)
History:  32 y.o. G3P0030 here today for discussion about upcoming surgery; scheduled for laparoscopic left oophorectomy for recurrent symptomatic large ovarian cysts.  Patient still reports having significant pain, but this is helped by her medications.   She wants chromopertubation to be added to her procedure.  The following portions of the patient's history were reviewed and updated as appropriate: allergies, current medications, past family history, past medical history, past social history, past surgical history and problem list.  Review of Systems:  Pertinent items are noted in HPI.  Objective:  Physical Exam BP 105/72  Pulse 80  Temp(Src) 98.4 F (36.9 C) (Oral)  Ht 4\' 11"  (1.499 m)  Wt 141 lb 9.6 oz (64.229 kg)  BMI 28.58 kg/m2  LMP 05/18/2012 Gen: NAD Abd: Soft, mild tenderness to palpation and nondistended Pelvic: Deferred  Assessment & Plan:  The risks of surgery were discussed in detail with the patient including but not limited to: bleeding which may require transfusion or reoperation; infection which may require antibiotic therapy; injury to bowel, bladder, ureters and major vessels or other surrounding organs; need for additional procedures including laparotomy; thromboembolic phenomenon, incisional problems and other postoperative or anesthesia complications.  Patient was told that the likelihood that her condition and symptoms will be treated effectively with this surgical management was very high; the postoperative expectations were also discussed in detail. The patient also understands the alternative treatment options which were discussed in full. All questions were answered.  Routine preoperative instructions of having nothing to eat or drink after midnight on the day prior to surgery and also coming to the hospital 1 1/2 hours prior to her time of surgery were also emphasized.  Pain precautions reviewed.

## 2012-06-12 NOTE — Patient Instructions (Signed)
Return to clinic for any scheduled appointments or for any gynecologic concerns as needed.   

## 2012-06-17 ENCOUNTER — Other Ambulatory Visit (HOSPITAL_COMMUNITY): Payer: Self-pay

## 2012-06-17 ENCOUNTER — Inpatient Hospital Stay (HOSPITAL_COMMUNITY): Admission: RE | Admit: 2012-06-17 | Payer: Self-pay | Source: Ambulatory Visit

## 2012-06-19 ENCOUNTER — Encounter (HOSPITAL_COMMUNITY)
Admission: RE | Admit: 2012-06-19 | Discharge: 2012-06-19 | Disposition: A | Discharge status: Home / Self Care | Payer: Self-pay | Source: Ambulatory Visit | Attending: Obstetrics & Gynecology | Admitting: Obstetrics & Gynecology

## 2012-06-19 ENCOUNTER — Encounter (HOSPITAL_COMMUNITY): Payer: Self-pay

## 2012-06-19 LAB — CBC
HCT: 39.7 % (ref 36.0–46.0)
Hemoglobin: 13.7 g/dL (ref 12.0–15.0)
MCV: 81.4 fL (ref 78.0–100.0)
RDW: 13.5 % (ref 11.5–15.5)
WBC: 5.3 10*3/uL (ref 4.0–10.5)

## 2012-06-19 NOTE — Patient Instructions (Addendum)
Your procedure is scheduled on:06/26/12  Enter through the Main Entrance at :1130 am Pick up desk phone and dial [phone] and inform us of your arrival.  Please call [phone] if you have any problems the morning of surgery.  Remember: Do not eat after midnight:WED Clear liquids ok until 9am Thursday   Take these meds the morning of surgery with a sip of water: pain med if needed  DO NOT wear jewelry, eye make-up, lipstick,body lotion, or dark fingernail polish. Do not shave for 48 hours prior to surgery.  If you are to be admitted after surgery, leave suitcase in car until your room has been assigned. Patients discharged on the day of surgery will not be allowed to drive home.

## 2012-06-26 ENCOUNTER — Encounter (HOSPITAL_COMMUNITY): Payer: Self-pay | Admitting: Anesthesiology

## 2012-06-26 ENCOUNTER — Encounter (HOSPITAL_COMMUNITY)
Admission: RE | Disposition: A | Discharge status: Home / Self Care | Payer: Self-pay | Source: Ambulatory Visit | Attending: Obstetrics & Gynecology

## 2012-06-26 ENCOUNTER — Inpatient Hospital Stay (HOSPITAL_COMMUNITY): Payer: Self-pay | Admitting: Anesthesiology

## 2012-06-26 ENCOUNTER — Ambulatory Visit (HOSPITAL_COMMUNITY)
Admission: RE | Admit: 2012-06-26 | Discharge: 2012-06-26 | Disposition: A | Discharge status: Home / Self Care | Payer: Self-pay | Source: Ambulatory Visit | Attending: Obstetrics & Gynecology | Admitting: Obstetrics & Gynecology

## 2012-06-26 DIAGNOSIS — N83209 Unspecified ovarian cyst, unspecified side: Secondary | ICD-10-CM | POA: Insufficient documentation

## 2012-06-26 DIAGNOSIS — N731 Chronic parametritis and pelvic cellulitis: Secondary | ICD-10-CM | POA: Insufficient documentation

## 2012-06-26 DIAGNOSIS — N946 Dysmenorrhea, unspecified: Secondary | ICD-10-CM

## 2012-06-26 DIAGNOSIS — N949 Unspecified condition associated with female genital organs and menstrual cycle: Secondary | ICD-10-CM

## 2012-06-26 DIAGNOSIS — R102 Pelvic and perineal pain: Secondary | ICD-10-CM

## 2012-06-26 DIAGNOSIS — N979 Female infertility, unspecified: Secondary | ICD-10-CM | POA: Insufficient documentation

## 2012-06-26 HISTORY — PX: SALPINGOOPHORECTOMY: SHX82

## 2012-06-26 HISTORY — PX: LAPAROSCOPY: SHX197

## 2012-06-26 SURGERY — LAPAROSCOPY OPERATIVE
Anesthesia: General | Site: Abdomen | Wound class: Clean Contaminated

## 2012-06-26 MED ORDER — MIDAZOLAM HCL 2 MG/2ML IJ SOLN
INTRAMUSCULAR | Status: AC
  Filled 2012-06-26: qty 2

## 2012-06-26 MED ORDER — CEFAZOLIN SODIUM-DEXTROSE 2-3 GM-% IV SOLR
INTRAVENOUS | Status: AC
  Filled 2012-06-26: qty 50

## 2012-06-26 MED ORDER — ONDANSETRON HCL 4 MG/2ML IJ SOLN
INTRAMUSCULAR | Status: DC | PRN
  Administered 2012-06-26: 4 mg via INTRAVENOUS

## 2012-06-26 MED ORDER — DOCUSATE SODIUM 100 MG PO CAPS: 100.0000 mg | ORAL_CAPSULE | Freq: Two times a day (BID) | ORAL | Status: DC | PRN

## 2012-06-26 MED ORDER — OXYCODONE-ACETAMINOPHEN 5-325 MG PO TABS: 1.0000 | ORAL_TABLET | Freq: Four times a day (QID) | ORAL | Status: DC | PRN

## 2012-06-26 MED ORDER — LACTATED RINGERS IV SOLN
INTRAVENOUS | Status: DC
  Administered 2012-06-26 (×4): via INTRAVENOUS

## 2012-06-26 MED ORDER — PROPOFOL 10 MG/ML IV EMUL
INTRAVENOUS | Status: AC
  Filled 2012-06-26: qty 20

## 2012-06-26 MED ORDER — ROCURONIUM BROMIDE 50 MG/5ML IV SOLN
INTRAVENOUS | Status: AC
  Filled 2012-06-26: qty 1

## 2012-06-26 MED ORDER — FENTANYL CITRATE 0.05 MG/ML IJ SOLN
INTRAMUSCULAR | Status: AC
  Filled 2012-06-26: qty 5

## 2012-06-26 MED ORDER — FENTANYL CITRATE 0.05 MG/ML IJ SOLN
25.0000 ug | INTRAMUSCULAR | Status: DC | PRN
  Administered 2012-06-26: 50 ug via INTRAVENOUS

## 2012-06-26 MED ORDER — DEXAMETHASONE SODIUM PHOSPHATE 4 MG/ML IJ SOLN
INTRAMUSCULAR | Status: DC | PRN
  Administered 2012-06-26: 10 mg via INTRAVENOUS

## 2012-06-26 MED ORDER — PROMETHAZINE HCL 25 MG/ML IJ SOLN: 6.2500 mg | INTRAMUSCULAR | Status: DC | PRN

## 2012-06-26 MED ORDER — ROCURONIUM BROMIDE 100 MG/10ML IV SOLN
INTRAVENOUS | Status: DC | PRN
  Administered 2012-06-26: 5 mg via INTRAVENOUS
  Administered 2012-06-26: 45 mg via INTRAVENOUS
  Administered 2012-06-26: 10 mg via INTRAVENOUS

## 2012-06-26 MED ORDER — BUPIVACAINE HCL (PF) 0.5 % IJ SOLN
INTRAMUSCULAR | Status: DC | PRN
  Administered 2012-06-26: 6 mL

## 2012-06-26 MED ORDER — DEXAMETHASONE SODIUM PHOSPHATE 10 MG/ML IJ SOLN
INTRAMUSCULAR | Status: AC
  Filled 2012-06-26: qty 1

## 2012-06-26 MED ORDER — ONDANSETRON HCL 4 MG/2ML IJ SOLN
INTRAMUSCULAR | Status: AC
  Filled 2012-06-26: qty 2

## 2012-06-26 MED ORDER — LACTATED RINGERS IV SOLN: INTRAVENOUS | Status: DC

## 2012-06-26 MED ORDER — MIDAZOLAM HCL 2 MG/2ML IJ SOLN: 0.5000 mg | Freq: Once | INTRAMUSCULAR | Status: DC | PRN

## 2012-06-26 MED ORDER — MEPERIDINE HCL 25 MG/ML IJ SOLN: 6.2500 mg | INTRAMUSCULAR | Status: DC | PRN

## 2012-06-26 MED ORDER — KETOROLAC TROMETHAMINE 30 MG/ML IJ SOLN
INTRAMUSCULAR | Status: AC
  Filled 2012-06-26: qty 1

## 2012-06-26 MED ORDER — CEFAZOLIN SODIUM-DEXTROSE 2-3 GM-% IV SOLR
2.0000 g | INTRAVENOUS | Status: AC
  Administered 2012-06-26: 2 g via INTRAVENOUS

## 2012-06-26 MED ORDER — MIDAZOLAM HCL 5 MG/5ML IJ SOLN
INTRAMUSCULAR | Status: DC | PRN
  Administered 2012-06-26: 2 mg via INTRAVENOUS

## 2012-06-26 MED ORDER — NEOSTIGMINE METHYLSULFATE 1 MG/ML IJ SOLN
INTRAMUSCULAR | Status: AC
  Filled 2012-06-26: qty 1

## 2012-06-26 MED ORDER — GLYCOPYRROLATE 0.2 MG/ML IJ SOLN
INTRAMUSCULAR | Status: AC
  Filled 2012-06-26: qty 2

## 2012-06-26 MED ORDER — LIDOCAINE HCL (CARDIAC) 20 MG/ML IV SOLN
INTRAVENOUS | Status: DC | PRN
  Administered 2012-06-26: 60 mg via INTRAVENOUS

## 2012-06-26 MED ORDER — METHYLENE BLUE 1 % INJ SOLN
INTRAMUSCULAR | Status: AC
  Filled 2012-06-26: qty 1

## 2012-06-26 MED ORDER — FENTANYL CITRATE 0.05 MG/ML IJ SOLN
INTRAMUSCULAR | Status: AC
Start: 2012-06-26 — End: 2012-06-26
  Administered 2012-06-26: 50 ug via INTRAVENOUS
  Filled 2012-06-26: qty 2

## 2012-06-26 MED ORDER — BUPIVACAINE HCL (PF) 0.5 % IJ SOLN
INTRAMUSCULAR | Status: AC
  Filled 2012-06-26: qty 30

## 2012-06-26 MED ORDER — FENTANYL CITRATE 0.05 MG/ML IJ SOLN
INTRAMUSCULAR | Status: DC | PRN
  Administered 2012-06-26 (×3): 50 ug via INTRAVENOUS
  Administered 2012-06-26: 100 ug via INTRAVENOUS
  Administered 2012-06-26 (×3): 50 ug via INTRAVENOUS
  Administered 2012-06-26: 100 ug via INTRAVENOUS

## 2012-06-26 MED ORDER — PROMETHAZINE HCL 25 MG PO TABS: 25.0000 mg | ORAL_TABLET | Freq: Four times a day (QID) | ORAL | Status: DC | PRN

## 2012-06-26 MED ORDER — STERILE WATER FOR IRRIGATION IR SOLN
Status: DC | PRN
  Administered 2012-06-26: 13:00:00

## 2012-06-26 MED ORDER — LIDOCAINE HCL (CARDIAC) 20 MG/ML IV SOLN
INTRAVENOUS | Status: AC
  Filled 2012-06-26: qty 5

## 2012-06-26 MED ORDER — PROPOFOL 10 MG/ML IV EMUL
INTRAVENOUS | Status: DC | PRN
  Administered 2012-06-26: 200 mg via INTRAVENOUS

## 2012-06-26 SURGICAL SUPPLY — 32 items
ADH SKN CLS APL DERMABOND .7 (GAUZE/BANDAGES/DRESSINGS) ×2
BAG SPEC RTRVL LRG 6X4 10 (ENDOMECHANICALS) ×2
CABLE HIGH FREQUENCY MONO STRZ (ELECTRODE) IMPLANT
CHLORAPREP W/TINT 26ML (MISCELLANEOUS) ×3 IMPLANT
CLOTH BEACON ORANGE TIMEOUT ST (SAFETY) ×3 IMPLANT
DERMABOND ADVANCED (GAUZE/BANDAGES/DRESSINGS) ×1
DERMABOND ADVANCED .7 DNX12 (GAUZE/BANDAGES/DRESSINGS) IMPLANT
FORCEPS CUTTING 33CM 5MM (CUTTING FORCEPS) IMPLANT
GLOVE BIO SURGEON STRL SZ7 (GLOVE) ×3 IMPLANT
GLOVE BIOGEL PI IND STRL 7.0 (GLOVE) ×2 IMPLANT
GLOVE BIOGEL PI INDICATOR 7.0 (GLOVE) ×1
GOWN PREVENTION PLUS LG XLONG (DISPOSABLE) ×3 IMPLANT
GOWN STRL REIN XL XLG (GOWN DISPOSABLE) ×3 IMPLANT
MANIPULATOR UTERINE 4.5 ZUMI (MISCELLANEOUS) ×1 IMPLANT
NEEDLE INSUFFLATION 120MM (ENDOMECHANICALS) ×3 IMPLANT
NS IRRIG 1000ML POUR BTL (IV SOLUTION) ×3 IMPLANT
PACK LAPAROSCOPY BASIN (CUSTOM PROCEDURE TRAY) ×3 IMPLANT
POUCH SPECIMEN RETRIEVAL 10MM (ENDOMECHANICALS) ×1 IMPLANT
PROTECTOR NERVE ULNAR (MISCELLANEOUS) ×3 IMPLANT
SCALPEL HARMONIC ACE (MISCELLANEOUS) ×1 IMPLANT
SEALER TISSUE G2 CVD JAW 35 (ENDOMECHANICALS) IMPLANT
SEALER TISSUE G2 CVD JAW 45CM (ENDOMECHANICALS)
SET IRRIG TUBING LAPAROSCOPIC (IRRIGATION / IRRIGATOR) IMPLANT
SUT VIC AB 3-0 X1 27 (SUTURE) IMPLANT
SUT VICRYL 0 UR6 27IN ABS (SUTURE) ×6 IMPLANT
SUT VICRYL 4-0 PS2 18IN ABS (SUTURE) ×3 IMPLANT
TOWEL OR 17X24 6PK STRL BLUE (TOWEL DISPOSABLE) ×6 IMPLANT
TRAY FOLEY CATH 14FR (SET/KITS/TRAYS/PACK) ×3 IMPLANT
TROCAR XCEL NON-BLD 11X100MML (ENDOMECHANICALS) ×3 IMPLANT
TROCAR XCEL NON-BLD 5MMX100MML (ENDOMECHANICALS) ×6 IMPLANT
WARMER LAPAROSCOPE (MISCELLANEOUS) ×3 IMPLANT
WATER STERILE IRR 1000ML POUR (IV SOLUTION) ×3 IMPLANT

## 2012-06-26 NOTE — Transfer of Care (Signed)
Immediate Anesthesia Transfer of Care Note  Patient: [name] [name]  Procedure(s) Performed: Procedure(s): LAPAROSCOPY OPERATIVE with CHROMOPERTUBATION (N/A) SALPINGO OOPHORECTOMY (Left)  Patient Location: PACU  Anesthesia Type:General  Level of Consciousness: awake, alert , oriented and patient cooperative  Airway & Oxygen Therapy: Patient Spontanous Breathing and Patient connected to nasal cannula oxygen  Post-op Assessment: Report given to PACU RN and Post -op Vital signs reviewed and stable  Post vital signs: Reviewed and stable  Complications: No apparent anesthesia complications

## 2012-06-26 NOTE — Interval H&P Note (Signed)
History and Physical Interval Note 06/26/2012 12:31 PM  [name] [name]  has presented today for surgery, with the diagnosis of Symptomatic left ovarian cysts  The various methods of treatment have been discussed with the patient and family. After consideration of risks, benefits and other options for treatment, the patient has consented to  Procedure:LAPAROSCOPY OPERATIVE, LEFT OOPHORECTOMY, PERTUBATION as a surgical intervention.  The patient's history has been reviewed, patient examined, no change in status, stable for surgery.  I have reviewed the patient's chart and labs.  Questions were answered to the patient's satisfaction.  To OR when ready.   Jaynie Collins, MD, FACOG Attending Obstetrician & Gynecologist Faculty Practice, [facility] [place]

## 2012-06-26 NOTE — Anesthesia Postprocedure Evaluation (Signed)
  Anesthesia Post Note  Patient: [name] [name]  Procedure(s) Performed: Procedure(s) (LRB): LAPAROSCOPY OPERATIVE with CHROMOPERTUBATION (N/A) SALPINGO OOPHORECTOMY (Left)  Anesthesia type: GA  Patient location: PACU  Post pain: Pain level controlled  Post assessment: Post-op Vital signs reviewed  Last Vitals:  Filed Vitals:   06/26/12 1500  BP: 136/70  Pulse: 100  Temp:   Resp: 20    Post vital signs: Reviewed  Level of consciousness: sedated  Complications: No apparent anesthesia complications

## 2012-06-26 NOTE — Preoperative (Signed)
Beta Blockers   Reason not to administer Beta Blockers:Not Applicable 

## 2012-06-26 NOTE — H&P (View-Only) (Signed)
History:  32 y.o. G3P0030 here today for discussion about upcoming surgery; scheduled for laparoscopic left oophorectomy for recurrent symptomatic large ovarian cysts.  Patient still reports having significant pain, but this is helped by her medications.   She wants chromopertubation to be added to her procedure.  The following portions of the patient's history were reviewed and updated as appropriate: allergies, current medications, past family history, past medical history, past social history, past surgical history and problem list.  Review of Systems:  Pertinent items are noted in HPI.  Objective:  Physical Exam BP 105/72  Pulse 80  Temp(Src) 98.4 F (36.9 C) (Oral)  Ht 4\' 11"  (1.499 m)  Wt 141 lb 9.6 oz (64.229 kg)  BMI 28.58 kg/m2  LMP 05/18/2012 Gen: NAD Abd: Soft, mild tenderness to palpation and nondistended Pelvic: Deferred  Assessment & Plan:  The risks of surgery were discussed in detail with the patient including but not limited to: bleeding which may require transfusion or reoperation; infection which may require antibiotic therapy; injury to bowel, bladder, ureters and major vessels or other surrounding organs; need for additional procedures including laparotomy; thromboembolic phenomenon, incisional problems and other postoperative or anesthesia complications.  Patient was told that the likelihood that her condition and symptoms will be treated effectively with this surgical management was very high; the postoperative expectations were also discussed in detail. The patient also understands the alternative treatment options which were discussed in full. All questions were answered.  Routine preoperative instructions of having nothing to eat or drink after midnight on the day prior to surgery and also coming to the hospital 1 1/2 hours prior to her time of surgery were also emphasized.  Pain precautions reviewed.

## 2012-06-26 NOTE — Anesthesia Preprocedure Evaluation (Addendum)
Anesthesia Evaluation  Patient identified by MRN, date of birth, ID band Patient awake    Reviewed: Allergy & Precautions, H&P , Patient's Chart, lab work & pertinent test results, reviewed documented beta blocker date and time   History of Anesthesia Complications Negative for: history of anesthetic complications  Airway Mallampati: III TM Distance: <3 FB Neck ROM: full    Dental no notable dental hx.    Pulmonary neg pulmonary ROS,  breath sounds clear to auscultation  Pulmonary exam normal       Cardiovascular Exercise Tolerance: Good negative cardio ROS  Rhythm:regular Rate:Normal     Neuro/Psych negative neurological ROS  negative psych ROS   GI/Hepatic negative GI ROS, Neg liver ROS,   Endo/Other  negative endocrine ROS  Renal/GU negative Renal ROS     Musculoskeletal   Abdominal   Peds  Hematology negative hematology ROS (+)   Anesthesia Other Findings Ovarian cyst     Abnormal Pap smear        PID (pelvic inflammatory disease)     Gonorrhea        Trichomonas     Medical history non-contributory    Reproductive/Obstetrics negative OB ROS                          Anesthesia Physical Anesthesia Plan  ASA: II  Anesthesia Plan: General ETT   Post-op Pain Management:    Induction:   Airway Management Planned:   Additional Equipment:   Intra-op Plan:   Post-operative Plan:   Informed Consent: I have reviewed the patients History and Physical, chart, labs and discussed the procedure including the risks, benefits and alternatives for the proposed anesthesia with the patient or authorized representative who has indicated his/her understanding and acceptance.   Dental Advisory Given  Plan Discussed with: CRNA and Surgeon  Anesthesia Plan Comments:         Anesthesia Quick Evaluation

## 2012-06-26 NOTE — Op Note (Signed)
[name] [name] PROCEDURE DATE: 06/26/2012  PREOPERATIVE DIAGNOSES: Recurrent symptomatic left ovarian cysts, chronic pelvic pain, infertility POSTOPERATIVE DIAGNOSES: The same, chronic pelvic inflammatory disease (PID) PROCEDURE: Laparoscopic left salpingoophorectomy, lysis of adhesions, chromopertubation SURGEON:  Dr. Jaynie Collins ASSISTANT: Dr. Tinnie Gens  ANESTHESIOLOGIST: Dr. Brayton Caves  INDICATIONS: 32 y.o. G3P0030 with aforementioned preoprative diagnoses here today for definitive surgical management.   Risks of surgery were discussed with the patient including but not limited to: bleeding which may require transfusion or reoperation; infection which may require antibiotics; injury to bowel, bladder, ureters or other surrounding organs; need for additional procedures including laparotomy; thromboembolic phenomenon, incisional problems and other postoperative/anesthesia complications. Written informed consent was obtained.    FINDINGS:  Small uterus with 3 cm anterior subserosal fibroid noted.  Significant adhesions of both adnexa to the posterior cul-de-sac and posterior fundus.  Normal appearing right ovary and right fallopian tube.  Enlarged left ovary, dilated left fallopian tube.  Only right fallopian tube patent during chromopertubation.   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  Normal appendix.  No evidence of endometriosis or any other abdominal/pelvic abnormality.   ANESTHESIA:    General INTRAVENOUS FLUIDS: 1500 ml ESTIMATED BLOOD LOSS: 25 ml SPECIMENS:  Left ovary and fallopian tube COMPLICATIONS: None immediate   PROCEDURE IN DETAIL:  The patient received intravenous antibiotics and had sequential compression devices applied to her lower extremities while in the preoperative area.  She was then taken to the operating room where general anesthesia was administered and was found to be adequate.  She was placed in the  dorsal lithotomy position, and was prepped and draped in a sterile manner.  A Foley catheter was inserted into her bladder and attached to constant drainage and a uterine manipulator was then advanced into the uterus .  After an adequate timeout was performed, attention was then turned to the patient's abdomen where a 11-mm skin incision was made in the umbilical fold.  The Veress needle was carefully introduced into the peritoneal cavity through the abdominal wall.  Intraperitoneal placement was confirmed by drop in intraabdominal pressure with insufflation of carbon dioxide gas.  Adequate pneumoperitoneum was obtained, and the 11-mm trocar and sleeve were then advanced without difficulty into the abdomen where intraabdominal placement was confirmed by the laparoscope. A survey of the patient's pelvis and abdomen revealed the findings above.   Bilateral 5-mm lower quadrant ports  were then placed under direct visualization.   Chromopertubation was performed with dilute methylene blue solution; only the right tube was patent. On the left side, the uteroovarian ligament was then clamped and transected with the Harmonic device.  The dense adhesions to the posterior fundus and posterior cul-de-sac were lysed without difficulty.  The left infundibulopelvic ligament was also clamped and transected allowing for salpingooophorectomy. The dense adhesions surrounding the right adnexa were also lysed.  Excellent hemostasis was noted. The specimen was then removed from the abdomen through the 11-mm port using an Endocatch bag, under direct visualization.  The operative site was surveyed, and it was found to be hemostatic.  No intraoperative injury to other surrounding organs was noted.  The abdomen was desufflated and all instruments were then removed from the patient's abdomen. The fascial incision of the umbilicus was closed with a 0 Vicryl figure of eight stitch.  All skin incisions were closed with Dermabond.   The  patient will be discharged to home as per PACU criteria.  Routine postoperative instructions given.  She  was prescribed Percocet, Phernergan and Colace.  She will follow up in the clinic on 07/16/12 for postoperative evaluation.

## 2012-06-27 ENCOUNTER — Encounter (HOSPITAL_COMMUNITY): Payer: Self-pay | Admitting: Obstetrics & Gynecology

## 2012-06-28 ENCOUNTER — Encounter: Payer: Self-pay | Admitting: Obstetrics & Gynecology

## 2012-06-28 DIAGNOSIS — N96 Recurrent pregnancy loss: Secondary | ICD-10-CM | POA: Insufficient documentation

## 2012-06-28 NOTE — Progress Notes (Signed)
Quick Note:  Benign left adnexal pathology; patient was called and this was dicussed with her. Also discussed chronic PID and its consequences. 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 At her postoperative visit, will check a thrombophilia panel and other labs to rule out etiology of recurrent SABs. ______

## 2012-07-08 ENCOUNTER — Telehealth: Payer: Self-pay | Admitting: *Deleted

## 2012-07-08 MED ORDER — TRAMADOL HCL 50 MG PO TABS: 50.0000 mg | ORAL_TABLET | Freq: Four times a day (QID) | ORAL | Status: DC | PRN

## 2012-07-08 NOTE — Telephone Encounter (Addendum)
Pt left message stating she had laparoscopy on 3/27. She wants medication refill and to speak to a nurse. 4/8  0945 - I called and spoke to pt. She states that she is still having some pain @ umbilicus and believes it will just take longer foe her to heal than from her previous laparoscopy. She states that the pain is not that bad. She is also having menstrual type cramping and pelvic pain due to she is currently having her period. In addition, she has had some diarrhea (possibly from something that she ate). Because of all these discomforts, pt is requesting some type of pain med Rx as she states that Tylenol is only slightly effective. I spoke w/Dr. Macon Large and received Rx for Ultram. Pt was informed and voiced understanding.

## 2012-07-09 ENCOUNTER — Telehealth: Payer: Self-pay | Admitting: *Deleted

## 2012-07-09 NOTE — Telephone Encounter (Addendum)
Called L[place]ie and left a message we are calling back to discuss her issue- please call clinic.  Called and discussed with Dr. Debroah Loop what else patient may have- due to her multiple allergies and unable to take NSAID's choices are limited- Dr. Debroah Loop states she may have Tylenol #3, 1-2 po every 6 hours, quantity 15, no refills.  Will wait to send order until discuss with [place] as can be allergic issues with tylenol when allergic to nsaids.

## 2012-07-09 NOTE — Telephone Encounter (Signed)
[name] [name] called and left a message stating she spoke with  A nurse earlier about some issues she was having with pain med and she called in a RX but states she can't afford it- wants someone to call her back and discuss if there is something else she can afford

## 2012-07-14 NOTE — Telephone Encounter (Signed)
Called pt and left message stating that this is our second attempt please give [place] a call at the clinics.

## 2012-07-16 ENCOUNTER — Ambulatory Visit: Payer: Self-pay | Admitting: Obstetrics & Gynecology

## 2012-08-08 ENCOUNTER — Encounter: Payer: Self-pay | Admitting: Obstetrics & Gynecology

## 2012-08-08 ENCOUNTER — Ambulatory Visit (INDEPENDENT_AMBULATORY_CARE_PROVIDER_SITE_OTHER): Payer: Self-pay | Admitting: Obstetrics & Gynecology

## 2012-08-08 VITALS — BP 108/75 | HR 74 | Temp 100.1°F | Ht 59.0 in | Wt 139.8 lb

## 2012-08-08 DIAGNOSIS — Z09 Encounter for follow-up examination after completed treatment for conditions other than malignant neoplasm: Secondary | ICD-10-CM

## 2012-08-08 DIAGNOSIS — N96 Recurrent pregnancy loss: Secondary | ICD-10-CM

## 2012-08-08 LAB — TSH: TSH: 0.47 u[IU]/mL (ref 0.350–4.500)

## 2012-08-08 LAB — HEMOGLOBIN A1C: Hgb A1c MFr Bld: 5.8 % — ABNORMAL HIGH (ref <5.7)

## 2012-08-08 NOTE — Patient Instructions (Signed)
Return to clinic for any scheduled appointments or for any gynecologic concerns as needed.   

## 2012-08-08 NOTE — Progress Notes (Signed)
  Subjective:     [name] [name] is a 32 y.o. G3P0030 (SAB x 3) female who presents to the clinic s/p laparoscopic left salpingoophorectomy, lysis of adhesions, chromopertubation on 06/26/12. 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  Still has some lower abdominal pain especially during intercourse.  Eating a regular diet without difficulty. Bowel movements are normal.  The following portions of the patient's history were reviewed and updated as appropriate: allergies, current medications, past family history, past medical history, past social history, past surgical history and problem list.  Review of Systems Pertinent items are noted in HPI.    Objective:   BP 108/75  Pulse 74  Temp(Src) 100.1 F (37.8 C)  Ht 4\' 11"  (1.499 m)  Wt 139 lb 12.8 oz (63.413 kg)  BMI 28.22 kg/m2  LMP 06/28/2012 General:  alert and no distress  Abdomen: soft, bowel sounds active, non-tender  Incision:   healing well, no drainage, no erythema, no hernia, no seroma, no swelling, well approximated, no dehiscence   Pathology 06/26/12 Ovary and fallopian tube, left - BENIGN OVARY; NEGATIVE FOR ATYPIA OR MALIGNANCY, SEE COMMENT. - BENIGN FALLOPIAN TUBE; NEGATIVE FOR ATYPIA OR MALIGNANCY.  Assessment:    Doing well postoperatively. Operative findings again reviewed. Pathology report discussed.    Plan:   1. Continue any current medications. Explained that chronic PID can cause chronic pain.  Treated by NSAIDs, hysterectomy is last resort. 2. Wound care discussed. 3. Activity restrictions: none 4. Anticipated return to work: not applicable. 5. Will draw hypercoagulable panel given 3 prior SABs; will follow up results and manage accordingly. 6.  Follow up as needed.

## 2012-08-11 LAB — CARDIOLIPIN ANTIBODIES, IGG, IGM, IGA: Anticardiolipin IgM: 9 MPL U/mL (ref <11)

## 2012-08-11 LAB — TESTOSTERONE, FREE, TOTAL, SHBG
Sex Hormone Binding: 44 nmol/L (ref 18–114)
Testosterone: 32 ng/dL (ref 10–70)

## 2012-08-11 LAB — PROTEIN S, TOTAL: Protein S Total: 91 % (ref 60–150)

## 2012-08-12 LAB — ANTITHROMBIN III: AntiThromb III Func: 98 % (ref 76–126)

## 2012-08-12 LAB — LUPUS ANTICOAGULANT PANEL
DRVVT: 27.8 secs (ref <42.9)
Lupus Anticoagulant: NOT DETECTED

## 2012-08-13 ENCOUNTER — Telehealth: Payer: Self-pay | Admitting: *Deleted

## 2012-08-13 NOTE — Telephone Encounter (Signed)
Message copied by Gerome Apley on Wed Aug 13, 2012  1:14 PM ------      Message from: Jaynie Collins A      Created: Tue Aug 12, 2012  4:58 PM       Negative labs.  Slightly elevated HgbA1C, have to eat less refined carbohydrates and increase exercise to reduce risk of diabetes.  Please call to inform patient of results and recommendations.       ------

## 2012-08-13 NOTE — Telephone Encounter (Signed)
Called [name] [name] and left a message we are calling with some information from your provider, please call clinic.

## 2012-08-14 NOTE — Telephone Encounter (Signed)
Called pt and left message that this is our second attempt please give [place] a call at the clinics.  Certified letter sent.

## 2012-08-15 ENCOUNTER — Telehealth: Payer: Self-pay

## 2012-08-15 NOTE — Telephone Encounter (Signed)
Patient called in and says she knows we have been trying to contact her.  She gave [place] a new contact number which I saved as a mobile number for her.  A certified letter has been sent to her on 08/13/12.

## 2012-08-15 NOTE — Telephone Encounter (Signed)
Called patient on new mobile number- no answer left message stating we were trying to return her phone call and the information we have been calling her about was recently sent in a letter which she should be receiving soon, should she have any other questions or concerns she can call us back at the clinics

## 2012-08-18 ENCOUNTER — Telehealth: Payer: Self-pay | Admitting: *Deleted

## 2012-08-18 NOTE — Telephone Encounter (Signed)
Pt left message stating that she knows we have been missing each other with telephone calls. She requested that she not be sent a letter containing her results as was mentioned in a previous message. I returned pt's call this morning and informed her that the letter has already been sent on 5/15. Pt expressed concern that private information would be in the letter that she may not want others in the household to see.  I assured her that it was sent as certified mail. I also read the letter to her. She was relieved and voiced understanding of the contents of the letter.

## 2012-08-19 ENCOUNTER — Ambulatory Visit: Payer: Self-pay

## 2012-09-08 ENCOUNTER — Inpatient Hospital Stay (HOSPITAL_COMMUNITY)
Admission: AD | Admit: 2012-09-08 | Discharge: 2012-09-08 | Disposition: A | Discharge status: Home / Self Care | Payer: Self-pay | Source: Ambulatory Visit | Attending: Obstetrics & Gynecology | Admitting: Obstetrics & Gynecology

## 2012-09-08 ENCOUNTER — Encounter (HOSPITAL_COMMUNITY): Payer: Self-pay | Admitting: *Deleted

## 2012-09-08 ENCOUNTER — Inpatient Hospital Stay (HOSPITAL_COMMUNITY): Payer: Self-pay

## 2012-09-08 DIAGNOSIS — N926 Irregular menstruation, unspecified: Secondary | ICD-10-CM | POA: Insufficient documentation

## 2012-09-08 DIAGNOSIS — R112 Nausea with vomiting, unspecified: Secondary | ICD-10-CM | POA: Insufficient documentation

## 2012-09-08 DIAGNOSIS — N83209 Unspecified ovarian cyst, unspecified side: Secondary | ICD-10-CM | POA: Insufficient documentation

## 2012-09-08 DIAGNOSIS — R109 Unspecified abdominal pain: Secondary | ICD-10-CM | POA: Insufficient documentation

## 2012-09-08 DIAGNOSIS — N949 Unspecified condition associated with female genital organs and menstrual cycle: Secondary | ICD-10-CM | POA: Insufficient documentation

## 2012-09-08 LAB — WET PREP, GENITAL
Clue Cells Wet Prep HPF POC: NONE SEEN
Trich, Wet Prep: NONE SEEN
Yeast Wet Prep HPF POC: NONE SEEN

## 2012-09-08 LAB — URINALYSIS, ROUTINE W REFLEX MICROSCOPIC
Glucose, UA: NEGATIVE mg/dL
Hgb urine dipstick: NEGATIVE
Protein, ur: NEGATIVE mg/dL
Specific Gravity, Urine: 1.02 (ref 1.005–1.030)
pH: 7 (ref 5.0–8.0)

## 2012-09-08 LAB — POCT PREGNANCY, URINE: Preg Test, Ur: NEGATIVE

## 2012-09-08 LAB — CBC
HCT: 36.1 % (ref 36.0–46.0)
MCH: 27.5 pg (ref 26.0–34.0)
MCHC: 33.5 g/dL (ref 30.0–36.0)
MCV: 82 fL (ref 78.0–100.0)
Platelets: 284 10*3/uL (ref 150–400)
RDW: 13.9 % (ref 11.5–15.5)

## 2012-09-08 MED ORDER — OXYCODONE-ACETAMINOPHEN 5-325 MG PO TABS: 2.0000 | ORAL_TABLET | Freq: Once | ORAL | Status: DC

## 2012-09-08 MED ORDER — OXYCODONE-ACETAMINOPHEN 5-325 MG PO TABS: 1.0000 | ORAL_TABLET | Freq: Four times a day (QID) | ORAL | Status: DC | PRN

## 2012-09-08 MED ORDER — OXYCODONE-ACETAMINOPHEN 5-325 MG PO TABS
1.0000 | ORAL_TABLET | Freq: Once | ORAL | Status: AC
  Administered 2012-09-08: 1 via ORAL
  Filled 2012-09-08: qty 1

## 2012-09-08 MED ORDER — PROMETHAZINE HCL 25 MG PO TABS
25.0000 mg | ORAL_TABLET | Freq: Once | ORAL | Status: AC
  Administered 2012-09-08: 25 mg via ORAL
  Filled 2012-09-08: qty 1

## 2012-09-08 MED ORDER — PROMETHAZINE HCL 25 MG PO TABS: 25.0000 mg | ORAL_TABLET | Freq: Four times a day (QID) | ORAL | Status: DC | PRN

## 2012-09-08 MED ORDER — DIPHENHYDRAMINE HCL 25 MG PO CAPS
25.0000 mg | ORAL_CAPSULE | Freq: Once | ORAL | Status: AC
  Administered 2012-09-08: 25 mg via ORAL
  Filled 2012-09-08: qty 1

## 2012-09-08 MED ORDER — ONDANSETRON HCL 4 MG PO TABS
4.0000 mg | ORAL_TABLET | Freq: Once | ORAL | Status: AC
  Administered 2012-09-08: 4 mg via ORAL
  Filled 2012-09-08: qty 1

## 2012-09-08 NOTE — MAU Provider Note (Signed)
Chief Complaint: Possible Pregnancy, Abdominal Pain, Emesis and Vaginal Discharge  First Provider Initiated Contact with Patient 09/08/12 2026     SUBJECTIVE HPI: [name] [name] is a 32 y.o. G66P0030 female who presents with low abdominal pain, nausea and vomiting x 2 days. Hx chronic PID. Worried that it has returned. Had LSO 2 months ago for chronic PID, recurrent ovarian cysts.   Past Medical History  Diagnosis Date  . Ovarian cyst   . Abnormal Pap smear   . PID (pelvic inflammatory disease)   . Gonorrhea   . Trichomonas   . Medical history non-contributory    OB History   Grav Para Term Preterm Abortions TAB SAB Ect Mult Living   3 0 0 0 3 0 3 0 0 0      # Outc Date GA Lbr Len/2nd Wgt Sex Del Anes PTL Lv   1 SAB            2 SAB            3 SAB              Past Surgical History  Procedure Laterality Date  . Diagnostic laparoscopy  May 2012  . Laparoscopy N/A 06/26/2012    Procedure: LAPAROSCOPY OPERATIVE with CHROMOPERTUBATION;  Surgeon: Tereso Newcomer, MD;  Location: [facility];  Service: Gynecology;  Laterality: N/A;  . Salpingoophorectomy Left 06/26/2012    Procedure: SALPINGO OOPHORECTOMY;  Surgeon: Tereso Newcomer, MD;  Location: [facility];  Service: Gynecology;  Laterality: Left;   History   Social History  . Marital Status: Single    Spouse Name: N/A    Number of Children: N/A  . Years of Education: N/A   Occupational History  . Not on file.   Social History Main Topics  . Smoking status: Current Every Day Smoker -- 0.50 packs/day for 9 years    Types: Cigarettes  . Smokeless tobacco: Never Used  . Alcohol Use: Yes     Comment: occ  . Drug Use: Yes    Special: Marijuana     Comment: current- occasionally  . Sexually Active: Yes    Birth Control/ Protection: Injection   Other Topics Concern  . Not on file   Social History Narrative  . No narrative on file   No current facility-administered medications on file prior to encounter.   No current  outpatient prescriptions on file prior to encounter.   Allergies  Allergen Reactions  . Ibuprofen Itching, Nausea Only and Other (See Comments)    Also causes headaches, constipation and personality changes.  . Naproxen Itching  . Peanut-Containing Drug Products Anaphylaxis  . Spinach Itching, Swelling and Rash    Swelling is of the throat.  . Nsaids Itching and Nausea And Vomiting  . Excedrin (Aspirin-Acetaminophen-Caffeine) Itching and Nausea And Vomiting  . Tramadol Itching  . Bc Powder (Aspirin-Salicylamide-Caffeine) Itching, Nausea And Vomiting and Rash    ROS: Pertinent items in HPI  OBJECTIVE Blood pressure 117/70, pulse 86, temperature 99.3 F (37.4 C), temperature source Oral, resp. rate 16, height 5' (1.524 m), weight 66.134 kg (145 lb 12.8 oz), SpO2 100.00%. GENERAL: Well-developed, well-nourished female in moderate distress.  HEENT: Normocephalic HEART: normal rate RESP: normal effort ABDOMEN: Soft, moderate bilat low abd tenderness. Pos BS x 4. Neg mass or rebound tenderness.  EXTREMITIES: Nontender, no edema NEURO: Alert and oriented SPECULUM EXAM: NEFG, small amount of thin, white, odorless discharge, no blood noted, cervix clean BIMANUAL: cervix closed; uterus normal  size, mild bilat adnexal tenderness. No masses. Pos CMT.   LAB RESULTS Results for orders placed during the hospital encounter of 09/08/12 (from the past 24 hour(s))  URINALYSIS, ROUTINE W REFLEX MICROSCOPIC     Status: None   Collection Time    09/08/12  6:40 PM      Result Value Range   Color, Urine YELLOW  YELLOW   APPearance CLEAR  CLEAR   Specific Gravity, Urine 1.020  1.005 - 1.030   pH 7.0  5.0 - 8.0   Glucose, UA NEGATIVE  NEGATIVE mg/dL   Hgb urine dipstick NEGATIVE  NEGATIVE   Bilirubin Urine NEGATIVE  NEGATIVE   Ketones, ur NEGATIVE  NEGATIVE mg/dL   Protein, ur NEGATIVE  NEGATIVE mg/dL   Urobilinogen, UA 0.2  0.0 - 1.0 mg/dL   Nitrite NEGATIVE  NEGATIVE   Leukocytes, UA NEGATIVE   NEGATIVE  POCT PREGNANCY, URINE     Status: None   Collection Time    09/08/12  6:50 PM      Result Value Range   Preg Test, Ur NEGATIVE  NEGATIVE  CBC     Status: None   Collection Time    09/08/12  8:33 PM      Result Value Range   WBC 7.0  4.0 - 10.5 K/uL   RBC 4.40  3.87 - 5.11 MIL/uL   Hemoglobin 12.1  12.0 - 15.0 g/dL   HCT 29.5  28.4 - 13.2 %   MCV 82.0  78.0 - 100.0 fL   MCH 27.5  26.0 - 34.0 pg   MCHC 33.5  30.0 - 36.0 g/dL   RDW 44.0  10.2 - 72.5 %   Platelets 284  150 - 400 K/uL  WET PREP, GENITAL     Status: Abnormal   Collection Time    09/08/12  9:20 PM      Result Value Range   Yeast Wet Prep HPF POC NONE SEEN  NONE SEEN   Trich, Wet Prep NONE SEEN  NONE SEEN   Clue Cells Wet Prep HPF POC NONE SEEN  NONE SEEN   WBC, Wet Prep HPF POC FEW (*) NONE SEEN    IMAGING US Transvaginal Non-ob  09/08/2012   *RADIOLOGY REPORT*  Clinical Data: Salpingectomy and oophorectomy on the left 06/26/2012.  Pelvic pain.  History of PID.  History of fibroids.  TRANSABDOMINAL AND TRANSVAGINAL ULTRASOUND OF PELVIS Technique:  Both transabdominal and transvaginal ultrasound examinations of the pelvis were performed. Transabdominal technique was performed for global imaging of the pelvis including uterus, ovaries, adnexal regions, and pelvic cul-de-sac.  It was necessary to proceed with endovaginal exam following the transabdominal exam to visualize the ovaries and endometrium.  Comparison:  05/07/2012  Findings:  Uterus: The uterus is anteverted and measures 10.7 x 5.4 x 6.5 cm. Hypoechoic myometrial mass lesions consistent with fibroids are demonstrated, with the right anterior subserosal lesion measuring 3 x 3.3 x 2.4 cm and a right posterior subserosal lesion measuring 2.4 x 2 x 2.5 cm.  Endometrium: Endometrial stripe thickness is normal, measured at 10 mm.  No abnormal endometrial fluid collection.  Right ovary:  Right ovary measures 6.3 x 4.4 x 5.1 cm.  There is a cystic mass in the right  ovary with mild complexity with thickened walls and mild internal echoes measuring 5.5 x 4.7 x 3.9 cm.  This is likely to represent hemorrhagic cyst.  This is new since the previous study.  Left ovary: Interval resection of the  left ovary since previous study.  No abnormal left adnexal mass lesions.  Other findings: No free fluid  IMPRESSION: Uterine fibroids.  Normal endometrium.  The surgical absence of the left ovary.  5.5 cm complex cyst in the right ovary likely represents a hemorrhagic cyst.  Recommend follow-up ultrasound examination 1 year.   Original Report Authenticated By: Burman Nieves, M.D.   US Pelvis Complete  09/08/2012   *RADIOLOGY REPORT*  Clinical Data: Salpingectomy and oophorectomy on the left 06/26/2012.  Pelvic pain.  History of PID.  History of fibroids.  TRANSABDOMINAL AND TRANSVAGINAL ULTRASOUND OF PELVIS Technique:  Both transabdominal and transvaginal ultrasound examinations of the pelvis were performed. Transabdominal technique was performed for global imaging of the pelvis including uterus, ovaries, adnexal regions, and pelvic cul-de-sac.  It was necessary to proceed with endovaginal exam following the transabdominal exam to visualize the ovaries and endometrium.  Comparison:  05/07/2012  Findings:  Uterus: The uterus is anteverted and measures 10.7 x 5.4 x 6.5 cm. Hypoechoic myometrial mass lesions consistent with fibroids are demonstrated, with the right anterior subserosal lesion measuring 3 x 3.3 x 2.4 cm and a right posterior subserosal lesion measuring 2.4 x 2 x 2.5 cm.  Endometrium: Endometrial stripe thickness is normal, measured at 10 mm.  No abnormal endometrial fluid collection.  Right ovary:  Right ovary measures 6.3 x 4.4 x 5.1 cm.  There is a cystic mass in the right ovary with mild complexity with thickened walls and mild internal echoes measuring 5.5 x 4.7 x 3.9 cm.  This is likely to represent hemorrhagic cyst.  This is new since the previous study.  Left ovary:  Interval resection of the left ovary since previous study.  No abnormal left adnexal mass lesions.  Other findings: No free fluid  IMPRESSION: Uterine fibroids.  Normal endometrium.  The surgical absence of the left ovary.  5.5 cm complex cyst in the right ovary likely represents a hemorrhagic cyst.  Recommend follow-up ultrasound examination 1 year.   Original Report Authenticated By: Burman Nieves, M.D.   MAU COURSE Pain decreased to 6 w/ 1 percocet. Nausea improving w/ phenergan. Tolerating POs. Requesting second percocet. Ready for D/C. Routine consult F/U per consult w/ Dr. Marice Potter.   ASSESSMENT 1. Hemorrhagic cyst of ovary     PLAN Discharge home in stable condition. F/U US in 1 year or PRN.       Follow-up Information   Call [facility].   Contact information:   [address] [place] [address] [phone]      Follow up with [facility] [place] [facility]. (As needed if symptoms worsen)    Contact information:   [address] [id number] [place] [address] [phone]       Medication List    STOP taking these medications       acetaminophen 500 MG tablet  Commonly known as:  TYLENOL      TAKE these medications       oxyCODONE-acetaminophen 5-325 MG per tablet  Commonly known as:  PERCOCET/ROXICET  Take 1 tablet by mouth every 6 (six) hours as needed for pain.     promethazine 25 MG tablet  Commonly known as:  PHENERGAN  Take 1 tablet (25 mg total) by mouth every 6 (six) hours as needed for nausea.        [place], [place] 09/08/2012  11:27 PM

## 2012-09-08 NOTE — MAU Note (Signed)
Patient states she has irregular periods since coming off Depo, last dose in February. States she started having abdominal pain, nausea and vomiting 2 days ago. Has had a thick white vaginal discharge for 2 days.

## 2012-09-10 ENCOUNTER — Telehealth: Payer: Self-pay | Admitting: *Deleted

## 2012-09-10 DIAGNOSIS — N83299 Other ovarian cyst, unspecified side: Secondary | ICD-10-CM

## 2012-09-10 NOTE — Telephone Encounter (Signed)
Patient left a message stating that she was seen in MAU and she now has a cyst on her ovary. She is having severe pelvic pain. She has a followup scheduled in the beginning of July. She is requesting that Dr. Macon Large personally call her.

## 2012-09-11 DIAGNOSIS — N83299 Other ovarian cyst, unspecified side: Secondary | ICD-10-CM | POA: Insufficient documentation

## 2012-09-11 NOTE — Telephone Encounter (Signed)
Faculty Practice OB/GYN Attending Phone Call Documentation  I called  [name] [name]  On her listed home and mobile numbers; the calls were placed around 1300 and 1600 on 09/11/12.  I left messages on both phones saying that I will try again tomorrow, but that I was covering L&D so it may be difficult to have time to call.  She was urged to go the ED for any urgent concerns.   Jaynie Collins, MD, FACOG Attending Obstetrician & Gynecologist Faculty Practice, [facility] [place]

## 2012-09-18 NOTE — Telephone Encounter (Signed)
Called pt and pt stated that she is continuing to have right side pelvic pain and pain during intercourse.  Pt spoke with Dr.Anyanwu and pt was advised to come to her appt in July for further evaluation.

## 2012-10-01 ENCOUNTER — Ambulatory Visit (INDEPENDENT_AMBULATORY_CARE_PROVIDER_SITE_OTHER): Payer: Self-pay | Admitting: Obstetrics & Gynecology

## 2012-10-01 ENCOUNTER — Encounter: Payer: Self-pay | Admitting: Obstetrics & Gynecology

## 2012-10-01 VITALS — BP 109/72 | HR 74 | Temp 97.7°F | Resp 20 | Ht 59.0 in | Wt 142.8 lb

## 2012-10-01 DIAGNOSIS — N83209 Unspecified ovarian cyst, unspecified side: Secondary | ICD-10-CM

## 2012-10-01 DIAGNOSIS — R102 Pelvic and perineal pain: Secondary | ICD-10-CM

## 2012-10-01 DIAGNOSIS — N979 Female infertility, unspecified: Secondary | ICD-10-CM

## 2012-10-01 DIAGNOSIS — G8929 Other chronic pain: Secondary | ICD-10-CM

## 2012-10-01 DIAGNOSIS — N731 Chronic parametritis and pelvic cellulitis: Secondary | ICD-10-CM

## 2012-10-01 DIAGNOSIS — N83299 Other ovarian cyst, unspecified side: Secondary | ICD-10-CM

## 2012-10-01 DIAGNOSIS — N949 Unspecified condition associated with female genital organs and menstrual cycle: Secondary | ICD-10-CM

## 2012-10-01 MED ORDER — MEDROXYPROGESTERONE ACETATE 150 MG/ML IM SUSP
150.0000 mg | INTRAMUSCULAR | Status: DC
  Administered 2012-10-01: 150 mg via INTRAMUSCULAR

## 2012-10-01 MED ORDER — OXYCODONE-ACETAMINOPHEN 10-325 MG PO TABS: 1.0000 | ORAL_TABLET | ORAL | Status: DC | PRN

## 2012-10-01 NOTE — Progress Notes (Signed)
Pt states that her pelvic pain is so severe that she takes Tylenol 500 mg- tabs 4 at a time. She is currently out of the percocet Rx. Pt was advised not to exceed manufacturer's recommendation for maximum amount of tylenol per [name] [name]. Pt states that she is aware but she feels she has no other option because she has to work and survive despite the pain.

## 2012-10-01 NOTE — Patient Instructions (Signed)
Return to clinic for any scheduled appointments or for any gynecologic concerns as needed.   

## 2012-10-02 NOTE — Progress Notes (Addendum)
GYNECOLOGY CLINIC PROGRESS NOTE  History:  32 y.o. G3P0030 s/p laparoscopic left salpingoophorectomy for recrurent symptomatic left ovarian cysts for several years.  She also underwent  lysis of adhesions and chromopertubation on 06/26/12. 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 After the surgery, she reports having severe pain and imaging showed a 5.5 cm right ovarian complex cyst thought to be a hemorrhagic cyst.  She is worried that her cysts will continue to occur on her right ovary and cause her debilitating pain.  She wants to try hormonal therapy to suppress ovulation; interested in Depo Provera.  Patient also wants a repeat ultrasound to see if the cyst has resolved.  She has been taking too many acetaminophen tablets, she was counseled about acetaminophen toxicity.  Wants a refill of her Percocet to use for pain.  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 She denies other symptoms.      The following portions of the patient's history were reviewed and updated as appropriate: allergies, current medications, past family history, past medical history, past social history, past surgical history and problem list.   Review of Systems:  Pertinent items are noted in HPI.   Objective:   BP 109/72  Pulse 74  Temp(Src) 97.7 F (36.5 C) (Oral)  Resp 20  Ht 4\' 11"  (1.499 m)  Wt 142 lb 12.8 oz (64.774 kg)  BMI 28.83 kg/m2  LMP 09/03/2012 Physical Exam deferred as per patient request  Labs and Imaging 09/08/2012    TRANSABDOMINAL AND TRANSVAGINAL ULTRASOUND OF PELVIS Clinical Data: Salpingectomy and oophorectomy on the left 06/26/2012.  Pelvic pain.  History of PID.  History of fibroids.  Comparison:  05/07/2012  Findings:  Uterus: The uterus is anteverted and measures 10.7 x 5.4 x 6.5 cm. Hypoechoic myometrial mass lesions consistent with fibroids are demonstrated, with the right anterior subserosal  lesion measuring 3 x 3.3 x 2.4 cm and a right posterior subserosal lesion measuring 2.4 x 2 x 2.5 cm.  Endometrium: Endometrial stripe thickness is normal, measured at 10 mm.  No abnormal endometrial fluid collection.  Right ovary:  Right ovary measures 6.3 x 4.4 x 5.1 cm.  There is a cystic mass in the right ovary with mild complexity with thickened walls and mild internal echoes measuring 5.5 x 4.7 x 3.9 cm.  This is likely to represent hemorrhagic cyst.  This is new since the previous study.  Left ovary: Interval resection of the left ovary since previous study.  No abnormal left adnexal mass lesions.  Other findings: No free fluid  IMPRESSION: Uterine fibroids.  Normal endometrium.  The surgical absence of the left ovary.  5.5 cm complex cyst in the right ovary likely represents a hemorrhagic cyst.  Recommend follow-up ultrasound examination 1 year.   Original Report Authenticated By: Burman Nieves, M.D.    Assessment & Plan:  Repeat ultrasound ordered Depo Provera given for ovarian cyst suppression; return in 3 months for repeat injection Referral made to infertility specialist; will follow up their recommendations Percocet prescribed for patient Pain precautions reviewed

## 2012-10-06 ENCOUNTER — Telehealth: Payer: Self-pay | Admitting: *Deleted

## 2012-10-06 DIAGNOSIS — R102 Pelvic and perineal pain: Secondary | ICD-10-CM

## 2012-10-06 DIAGNOSIS — N731 Chronic parametritis and pelvic cellulitis: Secondary | ICD-10-CM

## 2012-10-06 DIAGNOSIS — N83299 Other ovarian cyst, unspecified side: Secondary | ICD-10-CM

## 2012-10-06 NOTE — Telephone Encounter (Signed)
[name] [name] and left a message stating she got a prescription from Dr. Macon Large yesterday(10/01/12) and it is $90 and she doesn't have $90. States she got a dispense of 20 out of the 47 Dr. Macon Large prescribed. They told her would lose the rest which is fine since she can't afford anyways. States is calling to let us know most likely if she runs out she will be calling back . Wants to let Dr. Macon Large know she only got 20 not 60, but got what she could afford.

## 2012-10-06 NOTE — Telephone Encounter (Signed)
Called Seferina to notify her we got her message. She states she just wants Dr. Macon Large to know she only got 20 and when she needs it again she will call as Dr. Macon Large instructed her. States whens he calls again she only wants 20 because that is all she can afford. States Dr. Macon Large told her to take 1 every 6 hours, but she is taking 1 every 4 hours. States she will probably call in a few days to get more- advised her we are closed for weekend starting Friday at 12 noon.

## 2012-10-09 ENCOUNTER — Telehealth: Payer: Self-pay

## 2012-10-09 MED ORDER — OXYCODONE-ACETAMINOPHEN 10-325 MG PO TABS: 1.0000 | ORAL_TABLET | ORAL | Status: DC | PRN

## 2012-10-09 NOTE — Telephone Encounter (Signed)
Dr. Macon Large refilled prescription . Called Hadia and notified her prescription ready for her to pick up at front desk.

## 2012-10-09 NOTE — Addendum Note (Signed)
Addended by: Jaynie Collins A on: 10/09/2012 04:40 PM   Modules accepted: Orders

## 2012-10-09 NOTE — Telephone Encounter (Signed)
Prescription ordered for twenty tablets.  She will be called to pick up prescription.

## 2012-10-09 NOTE — Telephone Encounter (Signed)
Pt called wanting a refill on her pain medication.

## 2012-10-10 ENCOUNTER — Encounter: Payer: Self-pay | Admitting: Obstetrics & Gynecology

## 2012-10-10 DIAGNOSIS — N979 Female infertility, unspecified: Secondary | ICD-10-CM | POA: Insufficient documentation

## 2012-10-10 NOTE — Addendum Note (Signed)
Addended by: Jaynie Collins A on: 10/10/2012 11:04 AM   Modules accepted: Orders

## 2012-10-14 ENCOUNTER — Ambulatory Visit (HOSPITAL_COMMUNITY): Admission: RE | Admit: 2012-10-14 | Payer: Self-pay | Source: Ambulatory Visit

## 2012-10-16 ENCOUNTER — Telehealth: Payer: Self-pay | Admitting: *Deleted

## 2012-10-16 NOTE — Telephone Encounter (Signed)
Patient can wear tampons.  Encourage patient to sign up for MyChart so that she can send direct messages.

## 2012-10-16 NOTE — Telephone Encounter (Signed)
Patient left a message that she would like to know if she can use tampons now? She also would like to get a 30 day supply of pain medicine since she is now back to work. Will route to Dr. Macon Large for input on pain meds.

## 2012-10-20 ENCOUNTER — Telehealth: Payer: Self-pay

## 2012-10-20 DIAGNOSIS — R102 Pelvic and perineal pain: Secondary | ICD-10-CM

## 2012-10-20 DIAGNOSIS — N83299 Other ovarian cyst, unspecified side: Secondary | ICD-10-CM

## 2012-10-20 DIAGNOSIS — N731 Chronic parametritis and pelvic cellulitis: Secondary | ICD-10-CM

## 2012-10-20 NOTE — Telephone Encounter (Signed)
Pt called and asked if she could start wearing tampons, if she could get a refill on her pain medication, and what Dr. Macon Large wanted her to do post [place].  Re:  Routed to Dr. Macon Large for advisement.

## 2012-10-21 NOTE — Telephone Encounter (Signed)
Called pt and informed her that Dr. Macon Large has responded to her questions. Pt was informed that she may use tampons. I asked about her missed appt for [place] on 7/15. Pt states that she was referred to Dr. April Manson for infertility and his office will be doing an ultrasound next week. She did not think she needed it both places. I confirmed that she only needs 1 ultrasound. Also, Dr. Macon Large will provide a refill Rx of pain med tomorrow when she is in clinic. Pt was instructed to pick it up after 1400. Pt was also advised that she may sign a release of information so that Dr. Macon Large can be sent a copy of [place] and notes from Dr. Lyndal Rainbow office.  Pt agreed and voiced understanding.

## 2012-10-21 NOTE — Telephone Encounter (Signed)
When is her ultrasound date?  I do not see any upcoming appointments. I saw she was a no-show for 10/14/12; confirm that she needs this rescheduled and encourage her to do so.   She can wear tampons.  I will refill her medication tomorrow when I am at [facility] because it needs to be printed.  She needs an appointment with me after her ultrasound to discuss further pain management, I cannot prescribe narcotics indefinitely.

## 2012-10-22 MED ORDER — OXYCODONE-ACETAMINOPHEN 10-325 MG PO TABS: 1.0000 | ORAL_TABLET | ORAL | Status: DC | PRN

## 2012-10-22 NOTE — Addendum Note (Signed)
Addended by: Jaynie Collins A on: 10/22/2012 02:47 PM   Modules accepted: Orders

## 2012-10-31 ENCOUNTER — Ambulatory Visit (HOSPITAL_COMMUNITY)
Admission: RE | Admit: 2012-10-31 | Discharge: 2012-10-31 | Disposition: A | Discharge status: Home / Self Care | Payer: Self-pay | Source: Ambulatory Visit | Attending: Obstetrics & Gynecology | Admitting: Obstetrics & Gynecology

## 2012-10-31 DIAGNOSIS — D259 Leiomyoma of uterus, unspecified: Secondary | ICD-10-CM | POA: Insufficient documentation

## 2012-10-31 DIAGNOSIS — R102 Pelvic and perineal pain: Secondary | ICD-10-CM

## 2012-10-31 DIAGNOSIS — N83209 Unspecified ovarian cyst, unspecified side: Secondary | ICD-10-CM | POA: Insufficient documentation

## 2012-10-31 DIAGNOSIS — N83299 Other ovarian cyst, unspecified side: Secondary | ICD-10-CM

## 2012-10-31 DIAGNOSIS — N949 Unspecified condition associated with female genital organs and menstrual cycle: Secondary | ICD-10-CM | POA: Insufficient documentation

## 2012-11-03 ENCOUNTER — Telehealth: Payer: Self-pay | Admitting: Obstetrics and Gynecology

## 2012-11-03 NOTE — Telephone Encounter (Addendum)
Message copied by Toula Moos on Mon Nov 03, 2012  3:49 PM ------called patient and notified of result note below. Patient states she does not want to sign up for MyChart. Patient satisfied.       Message from: Tereso Newcomer      Created: Sun Nov 02, 2012 12:51 PM       Resolved right ovarian cyst.  Please call to inform patient of results. Encourage to sign up for MyChart.       ------

## 2012-11-28 ENCOUNTER — Inpatient Hospital Stay (HOSPITAL_COMMUNITY)
Admission: AD | Admit: 2012-11-28 | Discharge: 2012-11-29 | Disposition: A | Discharge status: Home / Self Care | Payer: No Typology Code available for payment source | Source: Ambulatory Visit | Attending: Obstetrics & Gynecology | Admitting: Obstetrics & Gynecology

## 2012-11-28 NOTE — MAU Note (Signed)
NOT IN LOBBY 

## 2012-11-29 ENCOUNTER — Encounter (HOSPITAL_COMMUNITY): Payer: Self-pay

## 2012-11-29 ENCOUNTER — Inpatient Hospital Stay (HOSPITAL_COMMUNITY)
Admission: AD | Admit: 2012-11-29 | Discharge: 2012-11-29 | Disposition: A | Discharge status: Home / Self Care | Payer: No Typology Code available for payment source | Source: Ambulatory Visit | Attending: Obstetrics and Gynecology | Admitting: Obstetrics and Gynecology

## 2012-11-29 DIAGNOSIS — R109 Unspecified abdominal pain: Secondary | ICD-10-CM | POA: Insufficient documentation

## 2012-11-29 DIAGNOSIS — N920 Excessive and frequent menstruation with regular cycle: Secondary | ICD-10-CM | POA: Insufficient documentation

## 2012-11-29 DIAGNOSIS — N946 Dysmenorrhea, unspecified: Secondary | ICD-10-CM

## 2012-11-29 HISTORY — DX: Benign neoplasm of connective and other soft tissue, unspecified: D21.9

## 2012-11-29 LAB — CBC
HCT: 39.7 % (ref 36.0–46.0)
Hemoglobin: 13.7 g/dL (ref 12.0–15.0)
MCH: 27.7 pg (ref 26.0–34.0)
MCHC: 34.5 g/dL (ref 30.0–36.0)
MCV: 80.4 fL (ref 78.0–100.0)
RBC: 4.94 MIL/uL (ref 3.87–5.11)

## 2012-11-29 LAB — URINE MICROSCOPIC-ADD ON

## 2012-11-29 LAB — HCG, QUANTITATIVE, PREGNANCY: hCG, Beta Chain, Quant, S: <1 m[IU]/mL (ref <5)

## 2012-11-29 LAB — URINALYSIS, ROUTINE W REFLEX MICROSCOPIC
Glucose, UA: NEGATIVE mg/dL
Ketones, ur: NEGATIVE mg/dL
Leukocytes, UA: NEGATIVE
Nitrite: NEGATIVE
Protein, ur: NEGATIVE mg/dL
Urobilinogen, UA: 0.2 mg/dL (ref 0.0–1.0)

## 2012-11-29 LAB — POCT PREGNANCY, URINE: Preg Test, Ur: NEGATIVE

## 2012-11-29 MED ORDER — OXYCODONE-ACETAMINOPHEN 5-325 MG PO TABS: 2.0000 | ORAL_TABLET | Freq: Once | ORAL | Status: DC

## 2012-11-29 MED ORDER — OXYCODONE-ACETAMINOPHEN 5-325 MG PO TABS
2.0000 | ORAL_TABLET | Freq: Once | ORAL | Status: AC
  Administered 2012-11-29: 2 via ORAL
  Filled 2012-11-29: qty 2

## 2012-11-29 MED ORDER — OXYCODONE-ACETAMINOPHEN 10-325 MG PO TABS: 1.0000 | ORAL_TABLET | Freq: Four times a day (QID) | ORAL | Status: DC | PRN

## 2012-11-29 NOTE — MAU Note (Signed)
Pt states at 0600 passed some type of tissue vaginally, brought in to MAU to show provider. Has hx of ovarian cysts and fibroids. UPT in MAU negative.

## 2012-11-29 NOTE — MAU Provider Note (Signed)
History     CSN: [id number]  Arrival date and time: 11/29/12 0905   First Provider Initiated Contact with Patient 11/29/12 1017      Chief Complaint  Patient presents with  . Abdominal Pain  . Vaginal Bleeding   HPI [name] [name] 32 y.o. Comes to MAU today with heavy bleeding and passing of tissue.  Brought tissue she passed at home.  Has a history of irregular cycles and is working with an infertility doctor.  Is planning to start trying to conceive in mid September.  Has been having spotting since Aug. 15 until 2 days ago when the bleeding began to be heavy and yesterday she passed some tissue.  Brought the tissue with her today.  Has been having severe cramping for 2 days.  Yesterday took Tylenol yesterday and it did not help.  Today has had no pain medication.  Is allergic to all NSAIDS.  Has been seen in the GYN clinic previously and Dr. Macon Large has prescribed Percocet for her pain but she is out now.  OB History   Grav Para Term Preterm Abortions TAB SAB Ect Mult Living   3 0 0 0 3 0 3 0 0 0       Past Medical History  Diagnosis Date  . Ovarian cyst   . Abnormal Pap smear   . PID (pelvic inflammatory disease)   . Gonorrhea   . Trichomonas   . Fibroids     Past Surgical History  Procedure Laterality Date  . Diagnostic laparoscopy  May 2012  . Laparoscopy N/A 06/26/2012    Procedure: LAPAROSCOPY OPERATIVE with CHROMOPERTUBATION;  Surgeon: Tereso Newcomer, MD;  Location: [facility];  Service: Gynecology;  Laterality: N/A;  . Salpingoophorectomy Left 06/26/2012    Procedure: SALPINGO OOPHORECTOMY;  Surgeon: Tereso Newcomer, MD;  Location: [facility];  Service: Gynecology;  Laterality: Left;    Family History  Problem Relation Age of Onset  . Anesthesia problems Neg Hx   . Cystic fibrosis Mother   . Hypertension Mother   . Diabetes Father     History  Substance Use Topics  . Smoking status: Current Every Day Smoker -- 1.00 packs/day for 10 years    Types:  Cigarettes  . Smokeless tobacco: Never Used  . Alcohol Use: Yes     Comment: occ    Allergies:  Allergies  Allergen Reactions  . Ibuprofen Itching, Nausea Only and Other (See Comments)    Also causes headaches, constipation and personality changes.  . Naproxen Itching  . Peanut-Containing Drug Products Anaphylaxis  . Spinach Itching, Swelling and Rash    Swelling is of the throat.  . Nsaids Itching and Nausea And Vomiting  . Excedrin [Aspirin-Acetaminophen-Caffeine] Itching and Nausea And Vomiting  . Tramadol Itching  . Bc Powder [Aspirin-Salicylamide-Caffeine] Itching, Nausea And Vomiting and Rash    Facility-administered medications prior to admission  Medication Dose Route Frequency Provider Last Rate Last Dose  . medroxyPROGESTERone (DEPO-PROVERA) injection 150 mg  150 mg Intramuscular Q90 days Tereso Newcomer, MD   150 mg at 10/01/12 1705   Prescriptions prior to admission  Medication Sig Dispense Refill  . acetaminophen (TYLENOL) 500 MG tablet Take 1,000 mg by mouth every 6 (six) hours as needed for pain.        Review of Systems  Constitutional: Negative for fever.  Gastrointestinal: Positive for abdominal pain. Negative for diarrhea and constipation.  Genitourinary:       No vaginal discharge. Vaginal bleeding. No  dysuria.   Physical Exam   Blood pressure 119/74, pulse 72, temperature 98.7 F (37.1 C), temperature source Oral, resp. rate 16, last menstrual period 11/13/2012, SpO2 100.00%.  Physical Exam  Nursing note and vitals reviewed. Constitutional: She is oriented to person, place, and time. She appears well-developed and well-nourished.  HENT:  Head: Normocephalic.  Eyes: EOM are normal.  Neck: Neck supple.  GI: Soft. There is tenderness. There is no rebound and no guarding.  Tenderness across entire lower abdomen but is mainly focused in low midline  Musculoskeletal: Normal range of motion.  Neurological: She is alert and oriented to person, place,  and time.  Skin: Skin is warm and dry.  Psychiatric: She has a normal mood and affect.    MAU Course  Procedures Results for orders placed during the hospital encounter of 11/29/12 (from the past 24 hour(s))  URINALYSIS, ROUTINE W REFLEX MICROSCOPIC     Status: Abnormal   Collection Time    11/29/12  9:25 AM      Result Value Range   Color, Urine YELLOW  YELLOW   APPearance CLEAR  CLEAR   Specific Gravity, Urine >1.030 (*) 1.005 - 1.030   pH 6.0  5.0 - 8.0   Glucose, UA NEGATIVE  NEGATIVE mg/dL   Hgb urine dipstick MODERATE (*) NEGATIVE   Bilirubin Urine NEGATIVE  NEGATIVE   Ketones, ur NEGATIVE  NEGATIVE mg/dL   Protein, ur NEGATIVE  NEGATIVE mg/dL   Urobilinogen, UA 0.2  0.0 - 1.0 mg/dL   Nitrite NEGATIVE  NEGATIVE   Leukocytes, UA NEGATIVE  NEGATIVE  URINE MICROSCOPIC-ADD ON     Status: Abnormal   Collection Time    11/29/12  9:25 AM      Result Value Range   Squamous Epithelial / LPF FEW (*) RARE   WBC, UA 0-2  <3 WBC/hpf   RBC / HPF 0-2  <3 RBC/hpf   Bacteria, UA FEW (*) RARE  POCT PREGNANCY, URINE     Status: None   Collection Time    11/29/12  9:31 AM      Result Value Range   Preg Test, Ur NEGATIVE  NEGATIVE  CBC     Status: None   Collection Time    11/29/12 10:52 AM      Result Value Range   WBC 5.8  4.0 - 10.5 K/uL   RBC 4.94  3.87 - 5.11 MIL/uL   Hemoglobin 13.7  12.0 - 15.0 g/dL   HCT 16.1  09.6 - 04.5 %   MCV 80.4  78.0 - 100.0 fL   MCH 27.7  26.0 - 34.0 pg   MCHC 34.5  30.0 - 36.0 g/dL   RDW 40.9  81.1 - 91.4 %   Platelets 307  150 - 400 K/uL  HCG, QUANTITATIVE, PREGNANCY     Status: None   Collection Time    11/29/12 10:52 AM      Result Value Range   hCG, Beta Chain, Quant, S <1  <5 mIU/mL    MDM Will send the tissue patient brought with her to pathology for exam.  Advised to be seen again in GYN clinic for the results and further management.  Percocet 5/325 2 tablets by mouth for pain.  Assessment and Plan  Heavy menses  Plan Follow up  in the GYN clinic for results and a plan for longterm management Rx Percocet 10/325 one po q6 hours as needed for pain (#10) no refills Drink at least 8  8-oz glasses of water every day. Please work on stopping smoking.  Use the Quitline at 1-800-QUITNOW.    Antwian Santaana 11/29/2012, 10:31 AM

## 2012-11-29 NOTE — MAU Note (Addendum)
Cramping and bleeding for past few days, began passing tissue yesterday

## 2012-11-29 NOTE — MAU Provider Note (Signed)
Attestation of Attending Supervision of Advanced Practitioner (CNM/NP): Evaluation and management procedures were performed by the Advanced Practitioner under my supervision and collaboration.  I have reviewed the Advanced Practitioner's note and chart, and I agree with the management and plan.  Brinton Brandel 11/29/2012 1:21 PM

## 2012-12-02 ENCOUNTER — Telehealth: Payer: Self-pay | Admitting: General Practice

## 2012-12-02 NOTE — Telephone Encounter (Signed)
Patient called and left message stating she has had go to the ER twice now and she cannot get an appt here till 10/9. States she is bleeding heavy heavy, like a hog and is in extreme intense pain and the ER isn't doing anything besides giving her pain medication and other meds and she is passing tissue vaginally and brought the stuff in to pathology but the ER keeps telling her she needs to contact us for results and was told to contact us for an appt ASAP but she can't get anything with [place] till 10/9 and to please call her back and tell her her next move. Called patient back stating I received her message from this morning and wanted to return her phone call and let her know that the tissue from pathology came back normal or benign and that sometimes woman can pass tissue while bleeding in their cycle. Patient became immediately upset and was crying on the phone stating that she is passing huge pieces of tissue and this has never happened to her before and this is in no way normal and that she is in severe pain trying to pass this stuff and everything that people are saying is contradictory and she needs help for this immediately and is very miserable. Told patient I hear her concerns and I am not trying to discount what she is saying in any way or say that she is not hurting or not miserable but right now I am the only person in the office, I have no way to make appts or speak to a doctor at the moment because our office is closed, however I wanted to call her today to let her know I got her message and I hear her concerns and everything that is going on and wanted to let you know that I will try to speak with someone tomorrow to see what we can work out with a sooner appt, but I wanted to call and talk to you so that you could be reassured that someone has heard your message and is trying to help you and come up with a solution. Patient seemed to calm down a bit more, verbalized understanding and had no further  questions

## 2012-12-03 ENCOUNTER — Encounter: Payer: Self-pay | Admitting: *Deleted

## 2012-12-03 NOTE — Telephone Encounter (Signed)
Called patient and informed her of new appt tomorrow 9/4 at 3:15. Patient verbalized understanding and had no further questions

## 2012-12-04 ENCOUNTER — Ambulatory Visit (INDEPENDENT_AMBULATORY_CARE_PROVIDER_SITE_OTHER): Payer: No Typology Code available for payment source | Admitting: Obstetrics & Gynecology

## 2012-12-04 ENCOUNTER — Encounter: Payer: Self-pay | Admitting: Obstetrics & Gynecology

## 2012-12-04 VITALS — BP 131/75 | HR 75 | Temp 98.1°F | Ht 61.0 in | Wt 141.4 lb

## 2012-12-04 DIAGNOSIS — N926 Irregular menstruation, unspecified: Secondary | ICD-10-CM

## 2012-12-04 DIAGNOSIS — N83209 Unspecified ovarian cyst, unspecified side: Secondary | ICD-10-CM

## 2012-12-04 DIAGNOSIS — N731 Chronic parametritis and pelvic cellulitis: Secondary | ICD-10-CM

## 2012-12-04 DIAGNOSIS — R102 Pelvic and perineal pain: Secondary | ICD-10-CM

## 2012-12-04 DIAGNOSIS — N83299 Other ovarian cyst, unspecified side: Secondary | ICD-10-CM

## 2012-12-04 DIAGNOSIS — N979 Female infertility, unspecified: Secondary | ICD-10-CM

## 2012-12-04 DIAGNOSIS — N946 Dysmenorrhea, unspecified: Secondary | ICD-10-CM

## 2012-12-04 DIAGNOSIS — N949 Unspecified condition associated with female genital organs and menstrual cycle: Secondary | ICD-10-CM

## 2012-12-04 DIAGNOSIS — N939 Abnormal uterine and vaginal bleeding, unspecified: Secondary | ICD-10-CM

## 2012-12-05 DIAGNOSIS — N939 Abnormal uterine and vaginal bleeding, unspecified: Secondary | ICD-10-CM | POA: Insufficient documentation

## 2012-12-05 NOTE — Patient Instructions (Signed)
Return to clinic for any scheduled appointments or for any gynecologic concerns as needed.   

## 2012-12-05 NOTE — Progress Notes (Signed)
GYNECOLOGY CLINIC ENCOUNTER NOTE  History:  32 y.o. G3P0030 here today for follow up results of analysis of tissue passed during episode of heavy bleeding on 11/29/12.  Patient is very upset, she is undergoing infertility evaluation and management by Dr. April Manson, and was placed on Femara (letrozole - aromatase inhibitor).  She reports that she was told we could have done this management at this clinic.  She is also frustrated that she had very heavy bleeding and passed some tissue; she has never done this before.  She also reports continued pelvic pain, says her remaining ovary "is killing" her.  She is also upset that we "did not check her testosterone and progesterone levels".  Of note, patient is s/p laparoscopic left salpingoophorectomy, lysis of adhesions, chromopertubation on 06/26/12 during which chronic pelvic inflammatory disease (PID) was noted throughout her pelvis.  This was done for debilitating chronic pelvic pain and recurrent left ovarian cysts.  Hysterectomy has been recommended for her given her level of pain, and the PID seen during surgery, and also because other hormonal modalities "do not work" for her.  She has been given escalating doses of Percocet as she has a severe allergy to NSAIDs.  She does have abnormal uterine bleeding (AUB), but this has never been very heavy and was never her chief complaint during her previous visits.  The following portions of the patient's history were reviewed and updated as appropriate: allergies, current medications, past family history, past medical history, past social history, past surgical history and problem list.  Review of Systems:  Pertinent items are noted in HPI.  Objective:  BP 131/75  Pulse 75  Temp(Src) 98.1 F (36.7 C)  Ht 5\' 1"  (1.549 m)  Wt 141 lb 6.4 oz (64.139 kg)  BMI 26.73 kg/m2  LMP 11/13/2012 Physical Exam deferred  11/29/12 Tissue analysis - BENIGN INFLAMED, HEMORRHAGIC AND DEGENERATIVE ENDOMETRIAL GLANDS AND  STROMA WITH PROGESTERONE INDUCED STROMAL CHANGE, SEE COMMENT. - NEGATIVE FOR HYPERPLASIA OR MALIGNANCY.  Assessment & Plan:  Patient was reminded that the focus of all her visits was her chronic pelvic pain and ovarian cysts, never AUB.  She was reminded that she did have a normal testosterone level that was checked on 08/08/12, there was no indication for random progesterone as this would not have affected her management, and I have no use for a random progesterone level.  She was told that if her infertility doctor requires labs, he can check them as this clinic does not provide infertility services.  I have never prescribed Femara for any indication and I would not have prescribed this medicine to her.  She was reminded that she has declined all further hormonal treatment given her infertility evaluation; but she did receive Depo Provera 150 mg on 10/01/12 in an attempt to see if this will help her pain by suppressing ovulation at that time.  She was also told that AUB can occur with Depo Provera; heavy AUB can be accompanied by passage of endometrial tissue, this is not an unusual phenomenon.   Analysis revealed benign endometrial tissue that was sloughed off during her bleeding.  Her hemoglobin then was 13.7, she is not bleeding now, and has no signs/symptoms of anemia.    Patient was reassured by this explanation and will follow up with Dr. April Manson for further infertility care. She was told to call/come back for any further general gynecologic issues.  She reported she will call back when she is out of her pain medication.

## 2012-12-11 ENCOUNTER — Telehealth: Payer: Self-pay | Admitting: *Deleted

## 2012-12-11 DIAGNOSIS — B9689 Other specified bacterial agents as the cause of diseases classified elsewhere: Secondary | ICD-10-CM

## 2012-12-11 MED ORDER — METRONIDAZOLE 500 MG PO TABS: 500.0000 mg | ORAL_TABLET | Freq: Two times a day (BID) | ORAL | Status: AC

## 2012-12-11 NOTE — Telephone Encounter (Signed)
Patient left message that she has bv. I called her back and advised that I could send a rx for Flagyl to her pharmacy per protocol. Pt agrees with plan.

## 2012-12-13 ENCOUNTER — Emergency Department (HOSPITAL_COMMUNITY)
Admission: EM | Admit: 2012-12-13 | Discharge: 2012-12-14 | Disposition: A | Discharge status: Home / Self Care | Payer: No Typology Code available for payment source | Attending: Emergency Medicine | Admitting: Emergency Medicine

## 2012-12-13 ENCOUNTER — Encounter (HOSPITAL_COMMUNITY): Payer: Self-pay

## 2012-12-13 DIAGNOSIS — Z8619 Personal history of other infectious and parasitic diseases: Secondary | ICD-10-CM | POA: Insufficient documentation

## 2012-12-13 DIAGNOSIS — Z79899 Other long term (current) drug therapy: Secondary | ICD-10-CM | POA: Insufficient documentation

## 2012-12-13 DIAGNOSIS — M109 Gout, unspecified: Secondary | ICD-10-CM | POA: Insufficient documentation

## 2012-12-13 DIAGNOSIS — F17200 Nicotine dependence, unspecified, uncomplicated: Secondary | ICD-10-CM | POA: Insufficient documentation

## 2012-12-13 DIAGNOSIS — Z8742 Personal history of other diseases of the female genital tract: Secondary | ICD-10-CM | POA: Insufficient documentation

## 2012-12-13 NOTE — ED Notes (Signed)
Per pt, pain to left foot and big toe.  Started yesterday.  No known injury.

## 2012-12-14 ENCOUNTER — Emergency Department (HOSPITAL_COMMUNITY): Payer: No Typology Code available for payment source

## 2012-12-14 MED ORDER — HYDROCODONE-ACETAMINOPHEN 5-325 MG PO TABS
2.0000 | ORAL_TABLET | Freq: Once | ORAL | Status: AC
  Administered 2012-12-14: 2 via ORAL
  Filled 2012-12-14: qty 2

## 2012-12-14 MED ORDER — HYDROCODONE-ACETAMINOPHEN 5-325 MG PO TABS: 1.0000 | ORAL_TABLET | ORAL | Status: DC | PRN

## 2012-12-14 MED ORDER — ONDANSETRON HCL 8 MG PO TABS: 8.0000 mg | ORAL_TABLET | Freq: Three times a day (TID) | ORAL | Status: DC | PRN

## 2012-12-14 MED ORDER — COLCHICINE 0.6 MG PO TABS: 0.6000 mg | ORAL_TABLET | Freq: Two times a day (BID) | ORAL | Status: DC

## 2012-12-14 MED ORDER — ONDANSETRON 8 MG PO TBDP
8.0000 mg | ORAL_TABLET | Freq: Once | ORAL | Status: AC
  Administered 2012-12-14: 8 mg via ORAL
  Filled 2012-12-14: qty 1

## 2012-12-14 MED ORDER — COLCHICINE 0.6 MG PO TABS
1.2000 mg | ORAL_TABLET | Freq: Once | ORAL | Status: DC
  Filled 2012-12-14: qty 2

## 2012-12-14 NOTE — ED Provider Notes (Signed)
CSN: [id number]     Arrival date & time 12/13/12  2343 History   None    Chief Complaint  Patient presents with  . Foot Pain   (Consider location/radiation/quality/duration/timing/severity/associated sxs/prior Treatment) HPI History provided by pt.   Pt has had severe pain in L great toe for the past 2 days.  Aggravated by bearing weight.  No associated fever, skin changes, edema, paresthesias.  No recent trauma.  No h/o gout but has had similar symptoms multiple times in the past that resolved spontaneously.  Past Medical History  Diagnosis Date  . Ovarian cyst   . Abnormal Pap smear   . PID (pelvic inflammatory disease)   . Gonorrhea   . Trichomonas   . Fibroids    Past Surgical History  Procedure Laterality Date  . Diagnostic laparoscopy  May 2012  . Laparoscopy N/A 06/26/2012    Procedure: LAPAROSCOPY OPERATIVE with CHROMOPERTUBATION;  Surgeon: Tereso Newcomer, MD;  Location: [facility];  Service: Gynecology;  Laterality: N/A;  . Salpingoophorectomy Left 06/26/2012    Procedure: SALPINGO OOPHORECTOMY;  Surgeon: Tereso Newcomer, MD;  Location: [facility];  Service: Gynecology;  Laterality: Left;   Family History  Problem Relation Age of Onset  . Anesthesia problems Neg Hx   . Cystic fibrosis Mother   . Hypertension Mother   . Diabetes Father    History  Substance Use Topics  . Smoking status: Current Every Day Smoker -- 0.50 packs/day for 10 years    Types: Cigarettes  . Smokeless tobacco: Never Used  . Alcohol Use: Yes     Comment: occ   OB History   Grav Para Term Preterm Abortions TAB SAB Ect Mult Living   3 0 0 0 3 0 3 0 0 0      Review of Systems  All other systems reviewed and are negative.    Allergies  Ibuprofen; Naproxen; Peanut-containing drug products; Spinach; Nsaids; Excedrin; Tramadol; and Bc powder  Home Medications   Current Outpatient Rx  Name  Route  Sig  Dispense  Refill  . acetaminophen (TYLENOL) 500 MG tablet   Oral   Take 1,000 mg by mouth  every 6 (six) hours as needed for pain.         Marland Kitchen colchicine 0.6 MG tablet   Oral   Take 1 tablet (0.6 mg total) by mouth 2 (two) times daily.   12 tablet   0   . HYDROcodone-acetaminophen (NORCO/VICODIN) 5-325 MG per tablet   Oral   Take 1 tablet by mouth every 4 (four) hours as needed for pain.   20 tablet   0   . metroNIDAZOLE (FLAGYL) 500 MG tablet   Oral   Take 1 tablet (500 mg total) by mouth 2 (two) times daily.   14 tablet   0   . ondansetron (ZOFRAN) 8 MG tablet   Oral   Take 1 tablet (8 mg total) by mouth every 8 (eight) hours as needed for nausea.   20 tablet   0   . oxyCODONE-acetaminophen (PERCOCET) 10-325 MG per tablet   Oral   Take 1 tablet by mouth every 6 (six) hours as needed for pain.   10 tablet   0    BP 105/79  Pulse 70  Temp(Src) 98.7 F (37.1 C) (Oral)  Resp 18  SpO2 100%  LMP 11/13/2012 Physical Exam  Nursing note and vitals reviewed. Constitutional: She is oriented to person, place, and time. She appears well-developed and well-nourished.  Uncomfortable appearing  HENT:  Head: Normocephalic and atraumatic.  Eyes:  Normal appearance  Neck: Normal range of motion.  Pulmonary/Chest: Effort normal.  Musculoskeletal: Normal range of motion.  L foot w/out edema, skin changes, deformity.  Tenderness of great toe and fist metatarsal, but worst at 1st MTP joint.  Pain w/ passive extension of great toe.  Distal sensation intact.   Neurological: She is alert and oriented to person, place, and time.  Psychiatric: She has a normal mood and affect. Her behavior is normal.    ED Course  Procedures (including critical care time) Labs Review Labs Reviewed - No data to display Imaging Review Dg Foot Complete Left  12/14/2012   CLINICAL DATA:  Foot pain. No known trauma.  EXAM: LEFT FOOT - COMPLETE 3+ VIEW  COMPARISON:  None.  FINDINGS: Mild hallux valgus with developing bony bunion. No acute osseous abnormality - including fracture,  malalignment, or erosion.  IMPRESSION: Negative for acute osseous abnormality.   Electronically Signed   By: Tiburcio Pea   On: 12/14/2012 00:30    MDM   1. Gout    32yo healthy F presents w/ severe, non-traumatic L MTP joint pain.  Xray nml. High clinical suspicion for gout; pt has no RF for septic arthritis.  Pt received first dose of colchicine in ED (allergic to most NSAIDs) and prescribed vicodin for pain as well.  Recommended PCP f/u.  Return precautions discussed.  12:47 AM     Otilio Miu, PA-C 12/14/12 [phone]

## 2012-12-14 NOTE — ED Provider Notes (Signed)
Medical screening examination/treatment/procedure(s) were performed by non-physician practitioner and as supervising physician I was immediately available for consultation/collaboration.   Hanley Seamen, MD 12/14/12 [phone]

## 2012-12-29 ENCOUNTER — Telehealth: Payer: Self-pay | Admitting: *Deleted

## 2012-12-29 NOTE — Telephone Encounter (Signed)
You can either call in Vicodin for her or she will have to come to the clinic tomorrow to pick up Percocet Rx. She needs to come in to sign a pain medication contract soon; will not administer next Rx without signing the contract.  Thank you.

## 2012-12-29 NOTE — Telephone Encounter (Signed)
Patient left a message stating that she is having a lot of pain and is bleeding again. She wants Dr. Macon Large to send her some pain medicine.

## 2012-12-29 NOTE — Telephone Encounter (Signed)
Patient would like to get the Percocet. She will come by the clinic tomorrow morning and will sign the pain contract then.

## 2012-12-31 ENCOUNTER — Other Ambulatory Visit: Payer: Self-pay | Admitting: Obstetrics & Gynecology

## 2012-12-31 DIAGNOSIS — R102 Pelvic and perineal pain: Secondary | ICD-10-CM

## 2012-12-31 DIAGNOSIS — N731 Chronic parametritis and pelvic cellulitis: Secondary | ICD-10-CM

## 2012-12-31 MED ORDER — OXYCODONE-ACETAMINOPHEN 10-325 MG PO TABS: 1.0000 | ORAL_TABLET | Freq: Four times a day (QID) | ORAL | Status: DC | PRN

## 2012-12-31 NOTE — Progress Notes (Signed)
Patient is here requesting another refill of her Percocet for her chronic pelvic pain.  Prescriptions refilled, pain contract signed.

## 2013-01-01 ENCOUNTER — Encounter: Payer: Self-pay | Admitting: *Deleted

## 2013-01-05 ENCOUNTER — Encounter: Payer: Self-pay | Admitting: *Deleted

## 2013-01-08 ENCOUNTER — Encounter: Payer: No Typology Code available for payment source | Admitting: Obstetrics & Gynecology

## 2013-02-16 ENCOUNTER — Telehealth: Payer: Self-pay

## 2013-02-16 DIAGNOSIS — N731 Chronic parametritis and pelvic cellulitis: Secondary | ICD-10-CM

## 2013-02-16 MED ORDER — OXYCODONE-ACETAMINOPHEN 10-325 MG PO TABS: 1.0000 | ORAL_TABLET | Freq: Four times a day (QID) | ORAL | Status: DC | PRN

## 2013-02-16 NOTE — Telephone Encounter (Signed)
Called pt. To inform her that Dr. Macon Large has prescribed Percocet for her pain. Also informed pt. Dr. Macon Large has ordered a pelvic ultrasound. Pt. Stated morning ASAP would be best. Scheduled ultrasound for Friday November 21 at 730AM with patient on the other line. Pt. Stated that works perfectly. Instructed pt. To arrive 15 min before ultrasound with a full bladder. Pt. Verbalized understanding and States she will be in today to pick up her prescription. States she has no other questions or concerns at this time.

## 2013-02-16 NOTE — Telephone Encounter (Signed)
Pt. Called stating she thinks she has a right ovarian cyst; she is having the same pain and "same everything" that she had with her other cyst. Asking for Dr. Macon Large to look at her chart. She is also requesting more pain medicine. States she was prescribed a total of 60 pills with the last prescription but only took 30 from the pharmacy as that was all she could afford. States she signed a waiver that stated she would need to call back for a refill.

## 2013-02-18 ENCOUNTER — Encounter: Payer: Self-pay | Admitting: *Deleted

## 2013-02-20 ENCOUNTER — Ambulatory Visit (HOSPITAL_COMMUNITY)
Admission: RE | Admit: 2013-02-20 | Discharge: 2013-02-20 | Disposition: A | Discharge status: Home / Self Care | Payer: No Typology Code available for payment source | Source: Ambulatory Visit | Attending: Obstetrics & Gynecology | Admitting: Obstetrics & Gynecology

## 2013-02-20 DIAGNOSIS — N949 Unspecified condition associated with female genital organs and menstrual cycle: Secondary | ICD-10-CM | POA: Insufficient documentation

## 2013-02-20 DIAGNOSIS — N83209 Unspecified ovarian cyst, unspecified side: Secondary | ICD-10-CM | POA: Insufficient documentation

## 2013-02-20 DIAGNOSIS — N731 Chronic parametritis and pelvic cellulitis: Secondary | ICD-10-CM

## 2013-02-20 DIAGNOSIS — D259 Leiomyoma of uterus, unspecified: Secondary | ICD-10-CM | POA: Insufficient documentation

## 2013-02-23 ENCOUNTER — Telehealth: Payer: Self-pay | Admitting: *Deleted

## 2013-02-23 NOTE — Telephone Encounter (Signed)
Message copied by Gerome Apley on Mon Feb 23, 2013  9:42 AM ------      Message from: Jaynie Collins A      Created: Sat Feb 21, 2013 11:03 AM       Right sided  5.7 cm physiologic cyst.  Will resolve on its own.  She should come to MAU with any concerning signs or symptoms. Only way to attempt to suppress forming ovarian cysts is via hormonal therapy, but patient cannot take this given that she is trying to conceive. Please call to inform patient of results and recommendations. ------

## 2013-02-23 NOTE — Telephone Encounter (Signed)
Called [name] [name] and informed her of results and reccomendations as written per Dr. Macon Large. [name] voices understanding and understands to report to MAU for concerning signs/symptoms such as severe pain.

## 2013-04-01 ENCOUNTER — Encounter (HOSPITAL_COMMUNITY): Payer: Self-pay | Admitting: *Deleted

## 2013-04-01 ENCOUNTER — Inpatient Hospital Stay (HOSPITAL_COMMUNITY)
Admission: AD | Admit: 2013-04-01 | Discharge: 2013-04-01 | Disposition: A | Discharge status: Home / Self Care | Payer: No Typology Code available for payment source | Source: Ambulatory Visit | Attending: Obstetrics & Gynecology | Admitting: Obstetrics & Gynecology

## 2013-04-01 ENCOUNTER — Inpatient Hospital Stay (HOSPITAL_COMMUNITY): Payer: No Typology Code available for payment source

## 2013-04-01 DIAGNOSIS — R109 Unspecified abdominal pain: Secondary | ICD-10-CM | POA: Insufficient documentation

## 2013-04-01 DIAGNOSIS — R102 Pelvic and perineal pain: Secondary | ICD-10-CM

## 2013-04-01 DIAGNOSIS — N731 Chronic parametritis and pelvic cellulitis: Secondary | ICD-10-CM

## 2013-04-01 DIAGNOSIS — N949 Unspecified condition associated with female genital organs and menstrual cycle: Secondary | ICD-10-CM | POA: Insufficient documentation

## 2013-04-01 DIAGNOSIS — N83209 Unspecified ovarian cyst, unspecified side: Secondary | ICD-10-CM

## 2013-04-01 LAB — POCT PREGNANCY, URINE: Preg Test, Ur: NEGATIVE

## 2013-04-01 LAB — WET PREP, GENITAL: Trich, Wet Prep: NONE SEEN

## 2013-04-01 LAB — URINALYSIS, ROUTINE W REFLEX MICROSCOPIC
Bilirubin Urine: NEGATIVE
Glucose, UA: NEGATIVE mg/dL
Ketones, ur: NEGATIVE mg/dL
Nitrite: NEGATIVE
Specific Gravity, Urine: 1.02 (ref 1.005–1.030)
pH: 6 (ref 5.0–8.0)

## 2013-04-01 LAB — GC/CHLAMYDIA PROBE AMP
CT Probe RNA: NEGATIVE
GC Probe RNA: NEGATIVE

## 2013-04-01 LAB — URINE MICROSCOPIC-ADD ON

## 2013-04-01 MED ORDER — ONDANSETRON 8 MG PO TBDP
8.0000 mg | ORAL_TABLET | Freq: Once | ORAL | Status: AC
  Administered 2013-04-01: 8 mg via ORAL
  Filled 2013-04-01: qty 1

## 2013-04-01 MED ORDER — OXYCODONE-ACETAMINOPHEN 10-325 MG PO TABS: 1.0000 | ORAL_TABLET | Freq: Four times a day (QID) | ORAL | Status: DC | PRN

## 2013-04-01 MED ORDER — OXYCODONE-ACETAMINOPHEN 5-325 MG PO TABS
1.0000 | ORAL_TABLET | Freq: Once | ORAL | Status: AC
  Administered 2013-04-01: 1 via ORAL
  Filled 2013-04-01: qty 1

## 2013-04-01 MED ORDER — ONDANSETRON HCL 8 MG PO TABS: 8.0000 mg | ORAL_TABLET | Freq: Three times a day (TID) | ORAL | Status: DC | PRN

## 2013-04-01 NOTE — MAU Provider Note (Signed)
History     CSN: [id number]  Arrival date and time: 04/01/13 0441   First Provider Initiated Contact with Patient 04/01/13 [phone]      No chief complaint on file.  HPI 32 y.o. G3P0030 with severe low abd pain, has known 5.7 cm right ovarian cyst diagnosed at the end of last month. Pain increasing since 12/26. Has history of chronic PID, pelvic pain, recurrent ovarian cysts. Has had LSO for ovarian cyst. 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 Has pain contract with our practice, last rx for percocet was in October, #60 tablets.    Past Medical History  Diagnosis Date  . Ovarian cyst   . Abnormal Pap smear   . PID (pelvic inflammatory disease)   . Gonorrhea   . Trichomonas   . Fibroids     Past Surgical History  Procedure Laterality Date  . Diagnostic laparoscopy  May 2012  . Laparoscopy N/A 06/26/2012    Procedure: LAPAROSCOPY OPERATIVE with CHROMOPERTUBATION;  Surgeon: Tereso Newcomer, MD;  Location: [facility];  Service: Gynecology;  Laterality: N/A;  . Salpingoophorectomy Left 06/26/2012    Procedure: SALPINGO OOPHORECTOMY;  Surgeon: Tereso Newcomer, MD;  Location: [facility];  Service: Gynecology;  Laterality: Left;    Family History  Problem Relation Age of Onset  . Anesthesia problems Neg Hx   . Cystic fibrosis Mother   . Hypertension Mother   . Diabetes Father     History  Substance Use Topics  . Smoking status: Current Every Day Smoker -- 0.50 packs/day for 10 years    Types: Cigarettes  . Smokeless tobacco: Never Used  . Alcohol Use: Yes     Comment: occ-  SOCIAL-    DEC LAST TIME.    Allergies:  Allergies  Allergen Reactions  . Ibuprofen Itching, Nausea Only and Other (See Comments)    Also causes headaches, constipation and personality changes.  . Naproxen Itching  . Peanut-Containing Drug Products Anaphylaxis  . Spinach Itching, Swelling and Rash    Swelling is of the throat.  . Nsaids Itching and  Nausea And Vomiting  . Excedrin [Aspirin-Acetaminophen-Caffeine] Itching and Nausea And Vomiting  . Tramadol Itching  . Bc Powder [Aspirin-Salicylamide-Caffeine] Itching, Nausea And Vomiting and Rash    Facility-administered medications prior to admission  Medication Dose Route Frequency Provider Last Rate Last Dose  . medroxyPROGESTERone (DEPO-PROVERA) injection 150 mg  150 mg Intramuscular Q90 days Tereso Newcomer, MD   150 mg at 10/01/12 1705   Prescriptions prior to admission  Medication Sig Dispense Refill  . acetaminophen (TYLENOL) 500 MG tablet Take 1,000 mg by mouth every 6 (six) hours as needed for pain.      Marland Kitchen colchicine 0.6 MG tablet Take 1 tablet (0.6 mg total) by mouth 2 (two) times daily.  12 tablet  0  . HYDROcodone-acetaminophen (NORCO/VICODIN) 5-325 MG per tablet Take 1 tablet by mouth every 4 (four) hours as needed for pain.  20 tablet  0  . [DISCONTINUED] ondansetron (ZOFRAN) 8 MG tablet Take 1 tablet (8 mg total) by mouth every 8 (eight) hours as needed for nausea.  20 tablet  0  . [DISCONTINUED] oxyCODONE-acetaminophen (PERCOCET) 10-325 MG per tablet Take 1 tablet by mouth every 6 (six) hours as needed for pain.  30 tablet  0    Review of Systems  Constitutional: Negative.   Respiratory: Negative.   Cardiovascular: Negative.   Gastrointestinal: Positive for abdominal pain. Negative  for nausea, vomiting, diarrhea and constipation.  Genitourinary: Negative for dysuria, urgency, frequency, hematuria and flank pain.       Negative for vaginal bleeding, + discharge, cramping, itching  Musculoskeletal: Negative.   Neurological: Negative.   Psychiatric/Behavioral: Negative.    Physical Exam   Blood pressure 111/74, pulse 72, temperature 98.6 F (37 C), temperature source Oral, resp. rate 20, height 5' (1.524 m), weight 152 lb 4 oz (69.06 kg), last menstrual period 03/27/2013.  Physical Exam  Nursing note and vitals reviewed. Constitutional: She is oriented to  person, place, and time. She appears well-developed and well-nourished. No distress.  HENT:  Head: Normocephalic and atraumatic.  Cardiovascular: Normal rate, regular rhythm and normal heart sounds.   Respiratory: Effort normal and breath sounds normal. No respiratory distress.  GI: Soft. Bowel sounds are normal. She exhibits no distension and no mass. There is no tenderness. There is no rebound and no guarding.  Genitourinary: There is no rash or lesion on the right labia. There is no rash or lesion on the left labia. Uterus is enlarged and tender. Uterus is not deviated and not fixed. Cervix exhibits motion tenderness. Cervix exhibits no discharge and no friability. Right adnexum displays tenderness and fullness. Right adnexum displays no mass. Left adnexum displays tenderness. Left adnexum displays no mass and no fullness. There is bleeding (trace) around the vagina. No erythema or tenderness around the vagina. No vaginal discharge found.  Neurological: She is alert and oriented to person, place, and time.  Skin: Skin is warm and dry.  Psychiatric: She has a normal mood and affect.    MAU Course  Procedures  Results for orders placed during the hospital encounter of 04/01/13 (from the past 24 hour(s))  URINALYSIS, ROUTINE W REFLEX MICROSCOPIC     Status: Abnormal   Collection Time    04/01/13  5:10 AM      Result Value Range   Color, Urine YELLOW  YELLOW   APPearance CLEAR  CLEAR   Specific Gravity, Urine 1.020  1.005 - 1.030   pH 6.0  5.0 - 8.0   Glucose, UA NEGATIVE  NEGATIVE mg/dL   Hgb urine dipstick TRACE (*) NEGATIVE   Bilirubin Urine NEGATIVE  NEGATIVE   Ketones, ur NEGATIVE  NEGATIVE mg/dL   Protein, ur NEGATIVE  NEGATIVE mg/dL   Urobilinogen, UA 0.2  0.0 - 1.0 mg/dL   Nitrite NEGATIVE  NEGATIVE   Leukocytes, UA NEGATIVE  NEGATIVE  URINE MICROSCOPIC-ADD ON     Status: None   Collection Time    04/01/13  5:10 AM      Result Value Range   Squamous Epithelial / LPF RARE   RARE   WBC, UA 0-2  <3 WBC/hpf  WET PREP, GENITAL     Status: Abnormal   Collection Time    04/01/13  6:17 AM      Result Value Range   Yeast Wet Prep HPF POC NONE SEEN  NONE SEEN   Trich, Wet Prep NONE SEEN  NONE SEEN   Clue Cells Wet Prep HPF POC NONE SEEN  NONE SEEN   WBC, Wet Prep HPF POC FEW (*) NONE SEEN   US Transvaginal Non-ob  04/01/2013   CLINICAL DATA:  Increased pelvic pain.  Known right-sided cyst.  EXAM: TRANSVAGINAL ULTRASOUND OF PELVIS  TECHNIQUE: Transvaginal ultrasound examination of the pelvis was performed including evaluation of the uterus, ovaries, adnexal regions, and pelvic cul-de-sac.  COMPARISON:  Pelvic ultrasound 02/20/2013.  FINDINGS: Uterus  Measurements:  9.4 x 4.6 x 5.8 cm. Multiple uterine fibroids are again noted. The largest is at the uterine fundus, measuring 2.2 x 2.8 x 1.9 cm. A smaller measured fibroid is 1.4 x 0.9 x 1.5 cm.  Endometrium  Thickness: 3.4 mm, within normal limits. No focal abnormality visualized.  Right ovary  Measurements: 5.4 x 3.1 x 3.8 cm. The previously seen complex cyst has significantly reduced in size, now measuring 3.8 x 1.8 x 3.5 cm. Other smaller follicles are evident. No layering debris is evident.  Left ovary  Surgically absent.  Other findings:  A small amount of fluid is likely physiologic  IMPRESSION: 1. Significant decrease in size of the right ovarian cyst. This likely represents a physiologic cyst. 2. Uterine fibroids. 3. Status post left oophorectomy.   Electronically Signed   By: Gennette Pac M.D.   On: 04/01/2013 07:26   Assessment and Plan   1. Other and unspecified ovarian cyst   2. Pelvic pain in female   3. Chronic Pelvic Inflammatory Disease   Cyst smaller than previous u/s, no other worrisome finding, rev'd precautions, rx given for Percocet 10/325 #15 and Zofran.     Medication List         acetaminophen 500 MG tablet  Commonly known as:  TYLENOL  Take 1,000 mg by mouth every 6 (six) hours as needed for  pain.     colchicine 0.6 MG tablet  Take 1 tablet (0.6 mg total) by mouth 2 (two) times daily.     HYDROcodone-acetaminophen 5-325 MG per tablet  Commonly known as:  NORCO/VICODIN  Take 1 tablet by mouth every 4 (four) hours as needed for pain.     ondansetron 8 MG tablet  Commonly known as:  ZOFRAN  Take 1 tablet (8 mg total) by mouth every 8 (eight) hours as needed for nausea.     oxyCODONE-acetaminophen 10-325 MG per tablet  Commonly known as:  PERCOCET  Take 1 tablet by mouth every 6 (six) hours as needed for pain.            Follow-up Information   Follow up with [facility]. (As needed)    Specialty:  Obstetrics and Gynecology   Contact information:   [address] [place] [address] [phone]        [facility] 04/01/2013, 7:39 AM

## 2013-04-01 NOTE — MAU Note (Signed)
PT SAYS SHE HAD VAG BLEEDING - STARTED 12-26-  STOPPED ON 12-30.- SAYS HER CYCLES  ARE USUALLY 2 WEEKS.    CRAMPS-  WERE DIFFERENT- HURTS TO COUGH OR BREATH.  GETS  CARE AT [facility]. LAST VISIT AT CLINIC- NOV- HAD U/S.    TOOK TYLENOL YESTERDAY AT 230PM.   TOOK  1/2 PERCOCET  ON Monday- NO RELIEF.

## 2013-04-06 ENCOUNTER — Telehealth: Payer: Self-pay | Admitting: General Practice

## 2013-04-06 NOTE — Telephone Encounter (Signed)
Patient called and left message stating she would like an appt with Dr Harolyn Rutherford asap, she hasn't been any better since her ER followup visit. Called patient and she stated she has been in a severe amount of pain in her left lower abdomen and needs to see Dr Harolyn Rutherford asap because this is driving her insane and she is just miserable. Asked patient if she could come in this Thursday 1/8 at 1pm. Patient stated that she would, patient had no further questions.

## 2013-04-09 ENCOUNTER — Encounter: Payer: Self-pay | Admitting: Obstetrics & Gynecology

## 2013-04-09 ENCOUNTER — Ambulatory Visit (INDEPENDENT_AMBULATORY_CARE_PROVIDER_SITE_OTHER): Payer: No Typology Code available for payment source | Admitting: Obstetrics & Gynecology

## 2013-04-09 VITALS — BP 125/88 | HR 85 | Temp 98.5°F | Ht 60.0 in | Wt 148.2 lb

## 2013-04-09 DIAGNOSIS — N949 Unspecified condition associated with female genital organs and menstrual cycle: Secondary | ICD-10-CM

## 2013-04-09 DIAGNOSIS — N912 Amenorrhea, unspecified: Secondary | ICD-10-CM

## 2013-04-09 DIAGNOSIS — R102 Pelvic and perineal pain: Secondary | ICD-10-CM

## 2013-04-09 DIAGNOSIS — N731 Chronic parametritis and pelvic cellulitis: Secondary | ICD-10-CM

## 2013-04-09 DIAGNOSIS — N83299 Other ovarian cyst, unspecified side: Secondary | ICD-10-CM

## 2013-04-09 DIAGNOSIS — N83209 Unspecified ovarian cyst, unspecified side: Secondary | ICD-10-CM

## 2013-04-09 LAB — POCT PREGNANCY, URINE
PREG TEST UR: NEGATIVE
PREG TEST UR: NEGATIVE

## 2013-04-09 MED ORDER — MEDROXYPROGESTERONE ACETATE 10 MG PO TABS: 10.0000 mg | ORAL_TABLET | Freq: Every day | ORAL | Status: DC

## 2013-04-09 MED ORDER — OXYCODONE-ACETAMINOPHEN 10-325 MG PO TABS: 1.0000 | ORAL_TABLET | Freq: Four times a day (QID) | ORAL | Status: DC | PRN

## 2013-04-09 NOTE — Patient Instructions (Signed)
Return to clinic for any scheduled appointments or for any gynecologic concerns as needed.   

## 2013-04-09 NOTE — Progress Notes (Signed)
   GYNECOLOGY CLINIC ENCOUNTER NOTE  History:   32 y.o. G3P0030 here today for follow up of chronic pelvic pain and recurrent ovarian cysts.  She was last seen in the MAU on 04/01/13 for same complaint. No new complaints, she desires refill of pain medication. LMP 02/24/13.   Of note, patient is s/p laparoscopic left salpingoophorectomy, lysis of adhesions, chromopertubation on 06/26/12 during which chronic pelvic inflammatory disease (PID) was noted throughout her pelvis. This was done for debilitating chronic pelvic pain and recurrent left ovarian cysts. Hysterectomy has been recommended for her given her level of pain, and the PID seen during surgery, and also because other hormonal modalities "do not work" for her. She has been given escalating doses of Percocet as she has a severe allergy to NSAIDs. Pain contract has been signed with this clinic.  She does have rare episodes of abnormal uterine bleeding (AUB), but this has never been very heavy and was never her chief complaint during her previous visits.   Patient is undergoing infertility evaluation and management by Dr. Kerin Perna, and was placed on Femara (letrozole - aromatase inhibitor).   The following portions of the patient's history were reviewed and updated as appropriate: allergies, current medications, past family history, past medical history, past social history, past surgical history and problem list.   Review of Systems:   Pertinent items are noted in HPI.   Objective:   BP 125/88  Pulse 85  Temp(Src) 98.5 F (36.9 C) (Oral)  Ht 5' (1.524 m)  Wt 148 lb 3.2 oz (67.223 kg)  BMI 28.94 kg/m2  LMP 02/24/2013 Gen - NAD Abd - Mild diffuse lower abdominal tenderness, no rebound or guarding Pelvic -Deferred  Labs and Imaging 04/09/2013  UPT negative  04/01/2013   CLINICAL DATA:  Increased pelvic pain.  Known right-sided cyst.  EXAM: TRANSVAGINAL ULTRASOUND OF PELVIS  TECHNIQUE: Transvaginal ultrasound examination of the pelvis  was performed including evaluation of the uterus, ovaries, adnexal regions, and pelvic cul-de-sac.  COMPARISON:  Pelvic ultrasound 02/20/2013.  FINDINGS: Uterus  Measurements: 9.4 x 4.6 x 5.8 cm. Multiple uterine fibroids are again noted. The largest is at the uterine fundus, measuring 2.2 x 2.8 x 1.9 cm. A smaller measured fibroid is 1.4 x 0.9 x 1.5 cm.  Endometrium  Thickness: 3.4 mm, within normal limits. No focal abnormality visualized.  Right ovary  Measurements: 5.4 x 3.1 x 3.8 cm. The previously seen complex cyst has significantly reduced in size, now measuring 3.8 x 1.8 x 3.5 cm. Other smaller follicles are evident. No layering debris is evident.  Left ovary  Surgically absent.  Other findings:  A small amount of fluid is likely physiologic  IMPRESSION: 1. Significant decrease in size of the right ovarian cyst. This likely represents a physiologic cyst. 2. Uterine fibroids. 3. Status post left oophorectomy.   Electronically Signed   By: Lawrence Santiago M.D.   On: 04/01/2013 07:26   Assessment & Plan:   Percocet refilled for patient. Pain precautions reviewed.    Patient will follow up with Dr. Kerin Perna for further infertility care.  UPT negative today.  Provera given to help induce bleeding to continue Femara administration.   Will return in 2 months for annual exam and pap smear.    Verita Schneiders, MD, [place] Attending [place], [place]

## 2013-05-08 ENCOUNTER — Inpatient Hospital Stay (HOSPITAL_COMMUNITY): Payer: No Typology Code available for payment source

## 2013-05-08 ENCOUNTER — Inpatient Hospital Stay (HOSPITAL_COMMUNITY)
Admission: AD | Admit: 2013-05-08 | Discharge: 2013-05-08 | Disposition: A | Discharge status: Home / Self Care | Payer: No Typology Code available for payment source | Source: Ambulatory Visit | Attending: Obstetrics and Gynecology | Admitting: Obstetrics and Gynecology

## 2013-05-08 ENCOUNTER — Encounter (HOSPITAL_COMMUNITY): Payer: Self-pay | Admitting: *Deleted

## 2013-05-08 ENCOUNTER — Telehealth: Payer: Self-pay | Admitting: *Deleted

## 2013-05-08 DIAGNOSIS — G8929 Other chronic pain: Secondary | ICD-10-CM | POA: Insufficient documentation

## 2013-05-08 DIAGNOSIS — D259 Leiomyoma of uterus, unspecified: Secondary | ICD-10-CM | POA: Insufficient documentation

## 2013-05-08 DIAGNOSIS — R109 Unspecified abdominal pain: Secondary | ICD-10-CM | POA: Insufficient documentation

## 2013-05-08 DIAGNOSIS — N949 Unspecified condition associated with female genital organs and menstrual cycle: Secondary | ICD-10-CM | POA: Insufficient documentation

## 2013-05-08 DIAGNOSIS — F17200 Nicotine dependence, unspecified, uncomplicated: Secondary | ICD-10-CM | POA: Insufficient documentation

## 2013-05-08 LAB — URINALYSIS, ROUTINE W REFLEX MICROSCOPIC
Bilirubin Urine: NEGATIVE
Glucose, UA: NEGATIVE mg/dL
Hgb urine dipstick: NEGATIVE
KETONES UR: NEGATIVE mg/dL
Leukocytes, UA: NEGATIVE
NITRITE: NEGATIVE
Protein, ur: NEGATIVE mg/dL
SPECIFIC GRAVITY, URINE: 1.01 (ref 1.005–1.030)
UROBILINOGEN UA: 0.2 mg/dL (ref 0.0–1.0)
pH: 7 (ref 5.0–8.0)

## 2013-05-08 LAB — CBC WITH DIFFERENTIAL/PLATELET
BASOS PCT: 1 % (ref 0–1)
Basophils Absolute: 0 10*3/uL (ref 0.0–0.1)
EOS ABS: 0.2 10*3/uL (ref 0.0–0.7)
Eosinophils Relative: 2 % (ref 0–5)
HCT: 37.1 % (ref 36.0–46.0)
Hemoglobin: 12.7 g/dL (ref 12.0–15.0)
Lymphocytes Relative: 38 % (ref 12–46)
Lymphs Abs: 2.5 10*3/uL (ref 0.7–4.0)
MCH: 27.9 pg (ref 26.0–34.0)
MCHC: 34.2 g/dL (ref 30.0–36.0)
MCV: 81.5 fL (ref 78.0–100.0)
Monocytes Absolute: 0.6 10*3/uL (ref 0.1–1.0)
Monocytes Relative: 9 % (ref 3–12)
NEUTROS PCT: 50 % (ref 43–77)
Neutro Abs: 3.3 10*3/uL (ref 1.7–7.7)
PLATELETS: 305 10*3/uL (ref 150–400)
RBC: 4.55 MIL/uL (ref 3.87–5.11)
RDW: 13.4 % (ref 11.5–15.5)
WBC: 6.5 10*3/uL (ref 4.0–10.5)

## 2013-05-08 LAB — POCT PREGNANCY, URINE: PREG TEST UR: NEGATIVE

## 2013-05-08 NOTE — Telephone Encounter (Signed)
Patient left message stating that she thinks that she has a ruptured cyst. She has a fever and pain. Also reports some diarrhea. Would like to know what Dr. Harolyn Rutherford would like for her to do.

## 2013-05-08 NOTE — MAU Provider Note (Signed)
Attestation of Attending Supervision of Advanced Practitioner (CNM/NP): Evaluation and management procedures were performed by the Advanced Practitioner under my supervision and collaboration.  I have reviewed the Advanced Practitioner's note and chart, and I agree with the management and plan.  HARRAWAY-SMITH, Larnce Schnackenberg 7:59 PM

## 2013-05-08 NOTE — MAU Note (Signed)
C/o abdominal pain without bleeding; wishes to get pregnant; hx of fibroids; hx of ovarian cysts; only has one fallophian tube;

## 2013-05-08 NOTE — MAU Note (Signed)
abd pain and pelvic pain, started yesterday.  Thinks she has another cyst, pain is on the right side.

## 2013-05-08 NOTE — MAU Provider Note (Addendum)
History     CSN: [id number]  Arrival date and time: 05/08/13 1818   First Provider Initiated Contact with Patient 05/08/13 1910      Chief Complaint  Patient presents with  . Pelvic Pain  . Abdominal Pain   Pelvic Pain The patient's primary symptoms include pelvic pain. Associated symptoms include abdominal pain (RLQ), diarrhea and nausea. Pertinent negatives include no chills, constipation, dysuria, fever (Tmax 99), headaches or vomiting.  Abdominal Pain Associated symptoms include diarrhea and nausea. Pertinent negatives include no constipation, dysuria, fever (Tmax 99), headaches or vomiting.   This is a 33 y.o. female who presents with c/o R lower quadrant pain since yesterday. Sharp and burning. Does not subside. Hx Left Oopherectomy.  Hx cysts and Fitz-Hughes-curtis PID scarring. Denies change in appetite. Does have nausea but no vomiting. + diarrhea for almost a week. Temp as high as 99 this week. Pt has chronic pain with a history of ovarian cysts and has a pain contract signed with GYN PCP.   RN Note:  C/o abdominal pain without bleeding; wishes to get pregnant; hx of fibroids; hx of ovarian cysts; only has one fallophian tube;        OB History   Grav Para Term Preterm Abortions TAB SAB Ect Mult Living   3 0 0 0 3 0 3 0 0 0       Past Medical History  Diagnosis Date  . Ovarian cyst   . Abnormal Pap smear   . PID (pelvic inflammatory disease)   . Gonorrhea   . Trichomonas   . Fibroids     Past Surgical History  Procedure Laterality Date  . Diagnostic laparoscopy  May 2012  . Laparoscopy N/A 06/26/2012    Procedure: LAPAROSCOPY OPERATIVE with CHROMOPERTUBATION;  Surgeon: Osborne Oman, MD;  Location: [facility];  Service: Gynecology;  Laterality: N/A;  . Salpingoophorectomy Left 06/26/2012    Procedure: SALPINGO OOPHORECTOMY;  Surgeon: Osborne Oman, MD;  Location: [facility];  Service: Gynecology;  Laterality: Left;    Family History  Problem Relation Age of  Onset  . Anesthesia problems Neg Hx   . Cystic fibrosis Mother   . Hypertension Mother   . Diabetes Father     History  Substance Use Topics  . Smoking status: Current Every Day Smoker -- 0.50 packs/day for 10 years    Types: Cigarettes  . Smokeless tobacco: Never Used  . Alcohol Use: Yes     Comment: occ-  SOCIAL-    DEC LAST TIME.    Allergies:  Allergies  Allergen Reactions  . Ibuprofen Itching, Nausea Only and Other (See Comments)    Also causes headaches, constipation and personality changes.  . Naproxen Itching  . Peanut-Containing Drug Products Anaphylaxis  . Spinach Itching, Swelling and Rash    Swelling is of the throat.  . Nsaids Itching and Nausea And Vomiting  . Excedrin [Aspirin-Acetaminophen-Caffeine] Itching and Nausea And Vomiting  . Tramadol Itching  . Bc Powder [Aspirin-Salicylamide-Caffeine] Itching, Nausea And Vomiting and Rash    Facility-administered medications prior to admission  Medication Dose Route Frequency Provider Last Rate Last Dose  . medroxyPROGESTERone (DEPO-PROVERA) injection 150 mg  150 mg Intramuscular Q90 days Osborne Oman, MD   150 mg at 10/01/12 1705   Prescriptions prior to admission  Medication Sig Dispense Refill  . acetaminophen (TYLENOL) 500 MG tablet Take 1,000 mg by mouth every 6 (six) hours as needed for pain.      . medroxyPROGESTERone (PROVERA)  10 MG tablet Take 1 tablet (10 mg total) by mouth daily. Use for ten days  10 tablet  2  . ondansetron (ZOFRAN) 8 MG tablet Take 1 tablet (8 mg total) by mouth every 8 (eight) hours as needed for nausea.  20 tablet  0  . oxyCODONE-acetaminophen (PERCOCET) 10-325 MG per tablet Take 1 tablet by mouth every 6 (six) hours as needed for pain.  30 tablet  0   Results for orders placed during the hospital encounter of 05/08/13 (from the past 48 hour(s))  URINALYSIS, ROUTINE W REFLEX MICROSCOPIC     Status: None   Collection Time    05/08/13  6:30 PM      Result Value Range   Color,  Urine YELLOW  YELLOW   APPearance CLEAR  CLEAR   Specific Gravity, Urine 1.010  1.005 - 1.030   pH 7.0  5.0 - 8.0   Glucose, UA NEGATIVE  NEGATIVE mg/dL   Hgb urine dipstick NEGATIVE  NEGATIVE   Bilirubin Urine NEGATIVE  NEGATIVE   Ketones, ur NEGATIVE  NEGATIVE mg/dL   Protein, ur NEGATIVE  NEGATIVE mg/dL   Urobilinogen, UA 0.2  0.0 - 1.0 mg/dL   Nitrite NEGATIVE  NEGATIVE   Leukocytes, UA NEGATIVE  NEGATIVE   Comment: MICROSCOPIC NOT DONE ON URINES WITH NEGATIVE PROTEIN, BLOOD, LEUKOCYTES, NITRITE, OR GLUCOSE <1000 mg/dL.  CBC WITH DIFFERENTIAL     Status: None   Collection Time    05/08/13  7:20 PM      Result Value Range   WBC 6.5  4.0 - 10.5 K/uL   RBC 4.55  3.87 - 5.11 MIL/uL   Hemoglobin 12.7  12.0 - 15.0 g/dL   HCT 37.1  36.0 - 46.0 %   MCV 81.5  78.0 - 100.0 fL   MCH 27.9  26.0 - 34.0 pg   MCHC 34.2  30.0 - 36.0 g/dL   RDW 13.4  11.5 - 15.5 %   Platelets 305  150 - 400 K/uL   Neutrophils Relative % 50  43 - 77 %   Neutro Abs 3.3  1.7 - 7.7 K/uL   Lymphocytes Relative 38  12 - 46 %   Lymphs Abs 2.5  0.7 - 4.0 K/uL   Monocytes Relative 9  3 - 12 %   Monocytes Absolute 0.6  0.1 - 1.0 K/uL   Eosinophils Relative 2  0 - 5 %   Eosinophils Absolute 0.2  0.0 - 0.7 K/uL   Basophils Relative 1  0 - 1 %   Basophils Absolute 0.0  0.0 - 0.1 K/uL   US Transvaginal Non-ob  05/08/2013   CLINICAL DATA:  Severe abdominal pain. History of ovarian cysts. Previous left oophorectomy and salpingectomy.  EXAM: TRANSVAGINAL ULTRASOUND OF PELVIS  TECHNIQUE: Transvaginal ultrasound examination of the pelvis was performed including evaluation of the uterus, ovaries, adnexal regions, and pelvic cul-de-sac.  COMPARISON:  04/01/2013.  FINDINGS: Uterus  Measurements: 9.1 x 6.2 x 5.1 cm. Multiple myometrial masses. The largest is located in the fundus anteriorly on the right, measuring 3.1 x 2.5 x 2.2 cm. None of these have a visible submucosal component.  Endometrium  Thickness: 12.9 mm.  No focal  abnormality visualized.  Right ovary  Measurements: 5.2 x 3.9 x 3.2 cm. Normal appearance/no adnexal mass.  Left ovary  Surgically absent.  Other findings:  No free fluid  IMPRESSION: No acute abnormality.  Previously noted uterine fibroids.   Electronically Signed   By: Richardson Landry  Joneen Caraway M.D.   On: 05/08/2013 20:27    Review of Systems  Constitutional: Positive for malaise/fatigue. Negative for fever (Tmax 99) and chills.  Gastrointestinal: Positive for nausea, abdominal pain (RLQ) and diarrhea. Negative for vomiting and constipation.  Genitourinary: Positive for pelvic pain. Negative for dysuria.  Neurological: Negative for dizziness and headaches.   Physical Exam   Blood pressure 123/73, pulse 79, temperature 98.8 F (37.1 C), temperature source Oral, resp. rate 18, height 4' 11.5" (1.511 m), weight 66.679 kg (147 lb), last menstrual period 04/20/2013.  Physical Exam  Constitutional: She is oriented to person, place, and time. She appears well-developed and well-nourished.  HENT:  Head: Normocephalic.  Cardiovascular: Normal rate.   Respiratory: Effort normal.  GI: Soft. She exhibits no distension and no mass. There is tenderness (RLQ). There is guarding. There is no rebound.  Genitourinary: Vagina normal and uterus normal. No vaginal discharge found.  + CMT Uterus and RLQ tender to palpation  Musculoskeletal: Normal range of motion.  Neurological: She is alert and oriented to person, place, and time.  Skin: Skin is warm and dry.  Psychiatric: She has a normal mood and affect.    MAU Course  Procedures None  MDM CBC; normal white count.  Declines cultures.  Wants CT scan.  I explained the main reason to do CT would be if we suspected appendicitis. [place] would be best for looking at ovary, especially given the radiation with CT.  Would recommend doing a CBC first and if WBC elevated, CT might be indicated 19:00 Pt requests pain medication from the RN; RN informed NP  Dr. Ihor Dow  consulted; no pain medication to be given to the patient at this time.  1905: NP discussed US findings with the patient; discussed with her the importance of following up with Dr. Harolyn Rutherford for pain management/ further workup due to the pain contract. Pt declined tylenol; pt allergic to NSAIDs.  Pt became very loud and upset stating that Dr. Harolyn Rutherford sent her here and that this was a waist of her time. Pt would like answers as to why she is having this pain. I explained once again that following up with her Dr. Harolyn Rutherford would be the best due to this being a chronic problem.  No acute findings on [place]. Pt left MAU following our conversation.  [place] drug database revealed a script for #30 10/325 mg percocet given on January 8th. Pt has a pain contract with Dr. Harolyn Rutherford and should follow up with her on Monday. Pt states she has no percocet left at home.    Assessment and Plan  Report given to oncoming PA  [facility] 05/08/2013, 7:18 PM   A:  Chronic pelvic pain  Uterine fibroids   P:  Follow up with [facility] on Monday Return to MAU as needed, if symptoms worsen Ok to take tylenol as needed as directed on the bottle   [place], NP 05/09/2013 12:22 AM

## 2013-05-08 NOTE — Telephone Encounter (Signed)
Called patient and left her a message that she will need to go to MAU for evaluation of her pain.

## 2013-05-09 NOTE — MAU Provider Note (Signed)
Attestation of Attending Supervision of Advanced Practitioner (CNM/NP): Evaluation and management procedures were performed by the Advanced Practitioner under my supervision and collaboration.  I have reviewed the Advanced Practitioner's note and chart, and I agree with the management and plan.  HARRAWAY-SMITH, Latham Kinzler 7:31 AM

## 2013-08-03 ENCOUNTER — Inpatient Hospital Stay (HOSPITAL_COMMUNITY)
Admission: AD | Admit: 2013-08-03 | Discharge: 2013-08-03 | Disposition: A | Discharge status: Home / Self Care | Payer: Self-pay | Source: Ambulatory Visit | Attending: Obstetrics & Gynecology | Admitting: Obstetrics & Gynecology

## 2013-08-03 ENCOUNTER — Encounter (HOSPITAL_COMMUNITY): Payer: Self-pay | Admitting: *Deleted

## 2013-08-03 ENCOUNTER — Inpatient Hospital Stay (HOSPITAL_COMMUNITY): Payer: No Typology Code available for payment source

## 2013-08-03 DIAGNOSIS — G8929 Other chronic pain: Secondary | ICD-10-CM

## 2013-08-03 DIAGNOSIS — B9689 Other specified bacterial agents as the cause of diseases classified elsewhere: Secondary | ICD-10-CM | POA: Insufficient documentation

## 2013-08-03 DIAGNOSIS — F17200 Nicotine dependence, unspecified, uncomplicated: Secondary | ICD-10-CM | POA: Insufficient documentation

## 2013-08-03 DIAGNOSIS — R102 Pelvic and perineal pain unspecified side: Secondary | ICD-10-CM

## 2013-08-03 DIAGNOSIS — N83209 Unspecified ovarian cyst, unspecified side: Secondary | ICD-10-CM

## 2013-08-03 DIAGNOSIS — R109 Unspecified abdominal pain: Secondary | ICD-10-CM | POA: Insufficient documentation

## 2013-08-03 DIAGNOSIS — N760 Acute vaginitis: Secondary | ICD-10-CM | POA: Insufficient documentation

## 2013-08-03 DIAGNOSIS — A499 Bacterial infection, unspecified: Secondary | ICD-10-CM | POA: Insufficient documentation

## 2013-08-03 DIAGNOSIS — N949 Unspecified condition associated with female genital organs and menstrual cycle: Secondary | ICD-10-CM | POA: Insufficient documentation

## 2013-08-03 LAB — URINALYSIS, ROUTINE W REFLEX MICROSCOPIC
Bilirubin Urine: NEGATIVE
Glucose, UA: NEGATIVE mg/dL
Hgb urine dipstick: NEGATIVE
KETONES UR: NEGATIVE mg/dL
LEUKOCYTES UA: NEGATIVE
NITRITE: NEGATIVE
Protein, ur: NEGATIVE mg/dL
Specific Gravity, Urine: >1.03 — ABNORMAL HIGH (ref 1.005–1.030)
Urobilinogen, UA: 0.2 mg/dL (ref 0.0–1.0)
pH: 6 (ref 5.0–8.0)

## 2013-08-03 LAB — RAPID URINE DRUG SCREEN, HOSP PERFORMED
AMPHETAMINES: NOT DETECTED
BENZODIAZEPINES: NOT DETECTED
Barbiturates: NOT DETECTED
Cocaine: NOT DETECTED
Opiates: NOT DETECTED
Tetrahydrocannabinol: POSITIVE — AB

## 2013-08-03 LAB — POCT PREGNANCY, URINE: Preg Test, Ur: NEGATIVE

## 2013-08-03 LAB — WET PREP, GENITAL
Trich, Wet Prep: NONE SEEN
Yeast Wet Prep HPF POC: NONE SEEN

## 2013-08-03 MED ORDER — ONDANSETRON 4 MG PO TBDP: 4.0000 mg | ORAL_TABLET | Freq: Three times a day (TID) | ORAL | Status: DC | PRN

## 2013-08-03 MED ORDER — ACETAMINOPHEN 500 MG PO TABS
1000.0000 mg | ORAL_TABLET | Freq: Once | ORAL | Status: AC
  Administered 2013-08-03: 1000 mg via ORAL
  Filled 2013-08-03: qty 2

## 2013-08-03 MED ORDER — OXYCODONE-ACETAMINOPHEN 5-325 MG PO TABS
1.0000 | ORAL_TABLET | Freq: Once | ORAL | Status: AC
  Administered 2013-08-03: 1 via ORAL
  Filled 2013-08-03: qty 1

## 2013-08-03 MED ORDER — ONDANSETRON 4 MG PO TBDP
4.0000 mg | ORAL_TABLET | Freq: Once | ORAL | Status: AC
  Administered 2013-08-03: 4 mg via ORAL
  Filled 2013-08-03: qty 1

## 2013-08-03 MED ORDER — OXYCODONE-ACETAMINOPHEN 5-325 MG PO TABS: 1.0000 | ORAL_TABLET | ORAL | Status: DC | PRN

## 2013-08-03 NOTE — MAU Provider Note (Signed)

## 2013-08-03 NOTE — MAU Note (Signed)
C/p pelvic pain around 1900 last night;

## 2013-08-03 NOTE — MAU Provider Note (Signed)
History     CSN: [id number]  Arrival date and time: 08/03/13 0747   First Provider Initiated Contact with Patient 08/03/13 [phone]      Chief Complaint  Patient presents with  . Abdominal Pain  . Vaginal Discharge  . Emesis   HPI  [name] [name] is a 33 y.o. female G3P0030 who presents to MAU with concerns of having another ovarian cysts. The patient has a significant medical history of chronic pelvic pain; she was at one point under a pain contract with Dr. Harolyn Rutherford in the clinic. She had a left oophorectomy on 06/26/2012 and multiple right ovarian cysts. The pain started Friday; the pain is located in her abdomen on her right side. The pain does not radiate "it just flares up". She currently rates her pain 9/10. She is not on birth control for cyst management due to the patient's desire to become pregnant.   Pt has an appt with Dr. Harolyn Rutherford on May 13th in the clinic.   OB History   Grav Para Term Preterm Abortions TAB SAB Ect Mult Living   3 0 0 0 3 0 3 0 0 0       Past Medical History  Diagnosis Date  . Ovarian cyst   . Abnormal Pap smear   . PID (pelvic inflammatory disease)   . Gonorrhea   . Trichomonas   . Fibroids     Past Surgical History  Procedure Laterality Date  . Diagnostic laparoscopy  May 2012  . Laparoscopy N/A 06/26/2012    Procedure: LAPAROSCOPY OPERATIVE with CHROMOPERTUBATION;  Surgeon: Osborne Oman, MD;  Location: [facility];  Service: Gynecology;  Laterality: N/A;  . Salpingoophorectomy Left 06/26/2012    Procedure: SALPINGO OOPHORECTOMY;  Surgeon: Osborne Oman, MD;  Location: [facility];  Service: Gynecology;  Laterality: Left;    Family History  Problem Relation Age of Onset  . Anesthesia problems Neg Hx   . Cystic fibrosis Mother   . Hypertension Mother   . Diabetes Father     History  Substance Use Topics  . Smoking status: Current Every Day Smoker -- 1.00 packs/day for 10 years    Types: Cigarettes  . Smokeless tobacco: Never Used   . Alcohol Use: Yes     Comment: occ-  SOCIAL-    DEC LAST TIME.    Allergies:  Allergies  Allergen Reactions  . Peanut-Containing Drug Products Anaphylaxis  . Spinach Anaphylaxis    Canned spinach  . Nsaids Hives, Itching and Nausea And Vomiting    All NSAIDs cause this reaction. Benadryl does not help. Ibuprofen also causes personality changes.   . Tramadol Itching and Nausea And Vomiting    Facility-administered medications prior to admission  Medication Dose Route Frequency Provider Last Rate Last Dose  . [DISCONTINUED] medroxyPROGESTERone (DEPO-PROVERA) injection 150 mg  150 mg Intramuscular Q90 days Osborne Oman, MD   150 mg at 10/01/12 1705   Prescriptions prior to admission  Medication Sig Dispense Refill  . acetaminophen (TYLENOL) 500 MG tablet Take 1,000-3,000 mg by mouth every 6 (six) hours as needed for moderate pain.       . Cyanocobalamin (VITAMIN B-12 PO) Take 1 tablet by mouth daily.      . IRON PO Take 1 tablet by mouth daily.       Results for orders placed during the hospital encounter of 08/03/13 (from the past 48 hour(s))  URINALYSIS, ROUTINE W REFLEX MICROSCOPIC     Status: Abnormal  Collection Time    08/03/13  8:05 AM      Result Value Ref Range   Color, Urine YELLOW  YELLOW   APPearance CLEAR  CLEAR   Specific Gravity, Urine >1.030 (*) 1.005 - 1.030   pH 6.0  5.0 - 8.0   Glucose, UA NEGATIVE  NEGATIVE mg/dL   Hgb urine dipstick NEGATIVE  NEGATIVE   Bilirubin Urine NEGATIVE  NEGATIVE   Ketones, ur NEGATIVE  NEGATIVE mg/dL   Protein, ur NEGATIVE  NEGATIVE mg/dL   Urobilinogen, UA 0.2  0.0 - 1.0 mg/dL   Nitrite NEGATIVE  NEGATIVE   Leukocytes, UA NEGATIVE  NEGATIVE   Comment: MICROSCOPIC NOT DONE ON URINES WITH NEGATIVE PROTEIN, BLOOD, LEUKOCYTES, NITRITE, OR GLUCOSE <1000 mg/dL.  POCT PREGNANCY, URINE     Status: None   Collection Time    08/03/13  8:28 AM      Result Value Ref Range   Preg Test, Ur NEGATIVE  NEGATIVE   Comment:             THE SENSITIVITY OF THIS     METHODOLOGY IS >24 mIU/mL  WET PREP, GENITAL     Status: Abnormal   Collection Time    08/03/13  8:45 AM      Result Value Ref Range   Yeast Wet Prep HPF POC NONE SEEN  NONE SEEN   Trich, Wet Prep NONE SEEN  NONE SEEN   Clue Cells Wet Prep HPF POC MODERATE (*) NONE SEEN   WBC, Wet Prep HPF POC FEW (*) NONE SEEN   Comment: MODERATE BACTERIA SEEN  URINE RAPID DRUG SCREEN (HOSP PERFORMED)     Status: Abnormal   Collection Time    08/03/13  8:45 AM      Result Value Ref Range   Opiates NONE DETECTED  NONE DETECTED   Cocaine NONE DETECTED  NONE DETECTED   Benzodiazepines NONE DETECTED  NONE DETECTED   Amphetamines NONE DETECTED  NONE DETECTED   Tetrahydrocannabinol POSITIVE (*) NONE DETECTED   Barbiturates NONE DETECTED  NONE DETECTED   Comment:            DRUG SCREEN FOR MEDICAL PURPOSES     ONLY.  IF CONFIRMATION IS NEEDED     FOR ANY PURPOSE, NOTIFY LAB     WITHIN 5 DAYS.                LOWEST DETECTABLE LIMITS     FOR URINE DRUG SCREEN     Drug Class       Cutoff (ng/mL)     Amphetamine      1000     Barbiturate      200     Benzodiazepine   952     Tricyclics       841     Opiates          300     Cocaine          300     THC              50     Performed at [facility]   US Transvaginal Non-ob  08/03/2013   CLINICAL DATA:  Right-sided pelvic pain. Possible torsion. Prior left oophorectomy  EXAM: TRANSVAGINAL ULTRASOUND OF PELVIS  DOPPLER ULTRASOUND OF OVARIES  TECHNIQUE: Transvaginal ultrasound examination of the pelvis was performed including evaluation of the uterus, ovaries, adnexal regions, and pelvic cul-de-sac.  Color and duplex Doppler ultrasound was utilized  to evaluate blood flow to the ovaries.  COMPARISON:  05/08/2013  FINDINGS: Uterus: 9.6 x 5.0 x 4.6 cm. An anterior uterine fundal fibroid measures 2.7 x 2.4 x 3.6 cm.  Endometrium:  Normal, 12 mm.  Right Ovary: 5.9 x 4.4 x 4.5 cm. An avascular lesion with low level internal  echoes and mildly enhanced through transmission measures 5.5 x 3.4 x 4.0 cm. Normal color Doppler signal.  Left Ovary:  Surgically absent.  Other Findings:  Trace free pelvic fluid is likely physiologic.  Pulsed Doppler evaluation demonstrates normal low-resistance arterial and venous waveforms in the left ovary.  IMPRESSION: 1. Right ovarian lesion which is favored to represent a hemorrhagic cyst. Per consensus criteria (given lesion size), follow-up pelvic ultrasound at 6-12 weeks is recommended to ensure resolution. This recommendation follows ACR consensus guidelines: Managing Incidental Findings on Abdominal CT: White Paper of the ACR Incidental Findings Committee. J Am Coll Radiol 2010;7:754-773 2. Status post left oophorectomy. 3. Uterine fibroid.   Electronically Signed   By: Abigail Miyamoto M.D.   On: 08/03/2013 10:01   [place] Art/ven Flow Abd Pelv Doppler  08/03/2013   CLINICAL DATA:  Right-sided pelvic pain. Possible torsion. Prior left oophorectomy  EXAM: TRANSVAGINAL ULTRASOUND OF PELVIS  DOPPLER ULTRASOUND OF OVARIES  TECHNIQUE: Transvaginal ultrasound examination of the pelvis was performed including evaluation of the uterus, ovaries, adnexal regions, and pelvic cul-de-sac.  Color and duplex Doppler ultrasound was utilized to evaluate blood flow to the ovaries.  COMPARISON:  05/08/2013  FINDINGS: Uterus: 9.6 x 5.0 x 4.6 cm. An anterior uterine fundal fibroid measures 2.7 x 2.4 x 3.6 cm.  Endometrium:  Normal, 12 mm.  Right Ovary: 5.9 x 4.4 x 4.5 cm. An avascular lesion with low level internal echoes and mildly enhanced through transmission measures 5.5 x 3.4 x 4.0 cm. Normal color Doppler signal.  Left Ovary:  Surgically absent.  Other Findings:  Trace free pelvic fluid is likely physiologic.  Pulsed Doppler evaluation demonstrates normal low-resistance arterial and venous waveforms in the left ovary.  IMPRESSION: 1. Right ovarian lesion which is favored to represent a hemorrhagic cyst. Per consensus  criteria (given lesion size), follow-up pelvic ultrasound at 6-12 weeks is recommended to ensure resolution. This recommendation follows ACR consensus guidelines: Managing Incidental Findings on Abdominal CT: White Paper of the ACR Incidental Findings Committee. J Am Coll Radiol 2010;7:754-773 2. Status post left oophorectomy. 3. Uterine fibroid.   Electronically Signed   By: Abigail Miyamoto M.D.   On: 08/03/2013 10:01    Review of Systems  Gastrointestinal: Negative for nausea, vomiting and abdominal pain.  Genitourinary:       + vaginal discharge; + odor.  No vaginal bleeding. No dysuria.    Physical Exam   Blood pressure 120/67, pulse 81, temperature 99.4 F (37.4 C), temperature source Oral, resp. rate 16, height 4' 11.75" (1.518 m), weight 65.499 kg (144 lb 6.4 oz), last menstrual period 07/16/2013, SpO2 99.00%.  Physical Exam  Constitutional: She is oriented to person, place, and time. She appears well-developed and well-nourished. No distress.  HENT:  Head: Normocephalic.  Eyes: Pupils are equal, round, and reactive to light.  Neck: Neck supple.  Respiratory: Effort normal.  GI: Soft. Normal appearance. There is tenderness in the right lower quadrant and suprapubic area. There is rigidity. There is no guarding.  Genitourinary:  Speculum exam: Vagina - Small amount of creamy discharge, no odor Cervix - No contact bleeding Bimanual exam: Cervix closed +CMT Uterus tender, normal size  Right adnexal tenderness, + suprapubic tenderness  GC/Chlam, wet prep done Chaperone present for exam.   Neurological: She is alert and oriented to person, place, and time.  Skin: Skin is warm. She is not diaphoretic.  Psychiatric: Her behavior is normal.    MAU Course  Procedures None  MDM Pt requests [place] Tylenol 1 gram given PO No relief from tylenol.   Wet prep  GC  Discussed findings with Dr. Ihor Dow. Pt is to keep her appt with Dr. Harolyn Rutherford; pt is not to receive other pain  medication RX's until discussed with Dr. Harolyn Rutherford at her appt.  Patient requests nausea medication to take with percocet Patient requests percocet prior to leaving MAU Percocet 1 tab given in MAU Zofran 4 mg PO given   Assessment and Plan   A:  1. Other and unspecified ovarian cyst   2. Chronic pelvic pain in female   3.  Bacterial vaginosis   P:  Discharge home in stable condition Keep your appointment in the clinic in [place]: Percocet #20 no refill         Zofran as requested by the patient         Flagyl  Return to MAU as needed,  If symptoms worsen.   Darrelyn Hillock Rasch, NP  08/03/2013, 8:41 AM

## 2013-08-03 NOTE — MAU Note (Signed)
Patient states she started having lower abdominal/pelvic pain on 5-1 that has gotten worse. Started having a heavy vaginal discharge with a little odor yesterday. Has had nausea and vomiting off and on since yesterday

## 2013-08-04 ENCOUNTER — Telehealth (HOSPITAL_COMMUNITY): Payer: Self-pay | Admitting: Obstetrics and Gynecology

## 2013-08-04 LAB — GC/CHLAMYDIA PROBE AMP
CT Probe RNA: NEGATIVE
GC PROBE AMP APTIMA: NEGATIVE

## 2013-08-04 MED ORDER — METRONIDAZOLE 500 MG PO TABS
500.0000 mg | ORAL_TABLET | Freq: Two times a day (BID) | ORAL | Status: DC
Start: 2013-08-04 — End: 2013-10-30

## 2013-08-04 NOTE — Telephone Encounter (Signed)
Flagyl sent to her pharmacy. Message left to have patient call NP regarding RX.

## 2013-08-12 ENCOUNTER — Ambulatory Visit: Payer: No Typology Code available for payment source | Admitting: Obstetrics & Gynecology

## 2013-08-30 ENCOUNTER — Inpatient Hospital Stay (HOSPITAL_COMMUNITY)
Admission: AD | Admit: 2013-08-30 | Discharge: 2013-08-31 | Disposition: A | Discharge status: Home / Self Care | Payer: No Typology Code available for payment source | Source: Ambulatory Visit | Attending: Obstetrics and Gynecology | Admitting: Obstetrics and Gynecology

## 2013-08-30 DIAGNOSIS — N83209 Unspecified ovarian cyst, unspecified side: Secondary | ICD-10-CM | POA: Insufficient documentation

## 2013-08-30 DIAGNOSIS — R109 Unspecified abdominal pain: Secondary | ICD-10-CM | POA: Insufficient documentation

## 2013-08-30 DIAGNOSIS — R102 Pelvic and perineal pain: Secondary | ICD-10-CM

## 2013-08-30 DIAGNOSIS — N949 Unspecified condition associated with female genital organs and menstrual cycle: Secondary | ICD-10-CM | POA: Insufficient documentation

## 2013-08-30 DIAGNOSIS — F17200 Nicotine dependence, unspecified, uncomplicated: Secondary | ICD-10-CM | POA: Insufficient documentation

## 2013-08-30 DIAGNOSIS — N898 Other specified noninflammatory disorders of vagina: Secondary | ICD-10-CM | POA: Insufficient documentation

## 2013-08-30 NOTE — MAU Note (Signed)
Pt reports pain rt side and pelvis. Vagina discharge. Was here recently and diagnosed with ovarian cyst.

## 2013-08-31 ENCOUNTER — Inpatient Hospital Stay (HOSPITAL_COMMUNITY): Payer: No Typology Code available for payment source

## 2013-08-31 ENCOUNTER — Encounter (HOSPITAL_COMMUNITY): Payer: Self-pay | Admitting: *Deleted

## 2013-08-31 DIAGNOSIS — N949 Unspecified condition associated with female genital organs and menstrual cycle: Secondary | ICD-10-CM

## 2013-08-31 LAB — URINALYSIS, ROUTINE W REFLEX MICROSCOPIC
Bilirubin Urine: NEGATIVE
Glucose, UA: NEGATIVE mg/dL
Hgb urine dipstick: NEGATIVE
Ketones, ur: NEGATIVE mg/dL
Leukocytes, UA: NEGATIVE
Nitrite: NEGATIVE
PH: 6 (ref 5.0–8.0)
Protein, ur: NEGATIVE mg/dL
Specific Gravity, Urine: 1.025 (ref 1.005–1.030)
Urobilinogen, UA: 0.2 mg/dL (ref 0.0–1.0)

## 2013-08-31 LAB — WET PREP, GENITAL
TRICH WET PREP: NONE SEEN
Yeast Wet Prep HPF POC: NONE SEEN

## 2013-08-31 LAB — POCT PREGNANCY, URINE: Preg Test, Ur: NEGATIVE

## 2013-08-31 NOTE — MAU Note (Signed)
Pt was dx with ovarian cyst in early may.  Has an appt with Dr. Harolyn Rutherford June 8th but couldn't wait that long. Say she has thick, white, vaginal discharge without an odor and reports vag itching.

## 2013-08-31 NOTE — ED Provider Notes (Signed)
History     CSN: [id number]  Arrival date and time: 08/30/13 2345   First Provider Initiated Contact with Patient 08/31/13 0056         Chief Complaint  Patient presents with  . Abdominal Pain   HPI [name] [name] is a [id number], 33 y.o. female who presents with abdominal pain & vaginal discharge.  Pt reports pelvic pain since Thursday; states constant but has been getting worse. Rates as 10/10 & sharp. Took Tylenol with no relief; movement makes pain worse. Increased vaginal discharge, white & clumpy, since Saturday; vaginal irritation. Denies vaginal bleeding. Nausea, no vomiting. Pelvic pain with urination since Thursday. Denies burning or increased frequency. Denies painful intercourse. Denies fever.   Past Medical History  Diagnosis Date  . Ovarian cyst   . Abnormal Pap smear   . PID (pelvic inflammatory disease)   . Gonorrhea   . Trichomonas   . Fibroids     Past Surgical History  Procedure Laterality Date  . Diagnostic laparoscopy  May 2012  . Laparoscopy N/A 06/26/2012    Procedure: LAPAROSCOPY OPERATIVE with CHROMOPERTUBATION;  Surgeon: Osborne Oman, MD;  Location: [facility];  Service: Gynecology;  Laterality: N/A;  . Salpingoophorectomy Left 06/26/2012    Procedure: SALPINGO OOPHORECTOMY;  Surgeon: Osborne Oman, MD;  Location: [facility];  Service: Gynecology;  Laterality: Left;    Family History  Problem Relation Age of Onset  . Anesthesia problems Neg Hx   . Cystic fibrosis Mother   . Hypertension Mother   . Diabetes Father     History  Substance Use Topics  . Smoking status: Current Every Day Smoker -- 1.00 packs/day for 10 years    Types: Cigarettes  . Smokeless tobacco: Never Used  . Alcohol Use: Yes     Comment: occ-  SOCIAL-    DEC LAST TIME.    Allergies:  Allergies  Allergen Reactions  . Peanut-Containing Drug Products Anaphylaxis  . Spinach Anaphylaxis    Canned spinach  . Nsaids Hives, Itching and Nausea And Vomiting    All NSAIDs  cause this reaction. Benadryl does not help. Ibuprofen also causes personality changes.   . Tramadol Itching and Nausea And Vomiting    Prescriptions prior to admission  Medication Sig Dispense Refill  . acetaminophen (TYLENOL) 500 MG tablet Take 1,000-3,000 mg by mouth every 6 (six) hours as needed for moderate pain.       . Cyanocobalamin (VITAMIN B-12 PO) Take 1 tablet by mouth daily.      . IRON PO Take 1 tablet by mouth daily.      . metroNIDAZOLE (FLAGYL) 500 MG tablet Take 1 tablet (500 mg total) by mouth 2 (two) times daily.  14 tablet  0  . ondansetron (ZOFRAN ODT) 4 MG disintegrating tablet Take 1 tablet (4 mg total) by mouth every 8 (eight) hours as needed for nausea or vomiting.  20 tablet  0  . oxyCODONE-acetaminophen (PERCOCET/ROXICET) 5-325 MG per tablet Take 1-2 tablets by mouth every 4 (four) hours as needed for severe pain.  20 tablet  0    Review of Systems  Constitutional: Negative.   Gastrointestinal: Positive for nausea and abdominal pain. Negative for vomiting, constipation and blood in stool.  Genitourinary: Positive for dysuria. Negative for urgency, frequency and hematuria.   Physical Exam   Blood pressure 101/63, pulse 85, temperature 98.9 F (37.2 C), temperature source Oral, resp. rate 18, height 5' (1.524 m), weight 144 lb (65.318 kg), last menstrual  period 08/09/2013, SpO2 99.00%.  Physical Exam  Constitutional: She appears well-developed and well-nourished.  GI: Soft. Bowel sounds are normal. She exhibits no distension and no mass. There is tenderness. There is no rebound and no guarding.  Skin: Skin is warm and dry.  Pelvic exam:  External: no lesion Vagina: small amount of white discharge Cervix: pink, smooth, no CMT Uterus: NSSC Adnexa: NT  MAU Course  Procedures Results for orders placed during the hospital encounter of 08/30/13 (from the past 24 hour(s))  URINALYSIS, ROUTINE W REFLEX MICROSCOPIC     Status: None   Collection Time     08/31/13 12:01 AM      Result Value Ref Range   Color, Urine YELLOW  YELLOW   APPearance CLEAR  CLEAR   Specific Gravity, Urine 1.025  1.005 - 1.030   pH 6.0  5.0 - 8.0   Glucose, UA NEGATIVE  NEGATIVE mg/dL   Hgb urine dipstick NEGATIVE  NEGATIVE   Bilirubin Urine NEGATIVE  NEGATIVE   Ketones, ur NEGATIVE  NEGATIVE mg/dL   Protein, ur NEGATIVE  NEGATIVE mg/dL   Urobilinogen, UA 0.2  0.0 - 1.0 mg/dL   Nitrite NEGATIVE  NEGATIVE   Leukocytes, UA NEGATIVE  NEGATIVE  POCT PREGNANCY, URINE     Status: None   Collection Time    08/31/13 12:17 AM      Result Value Ref Range   Preg Test, Ur NEGATIVE  NEGATIVE    US Transvaginal Non-ob  08/31/2013   CLINICAL DATA:  Cramping and abdominal pain.  EXAM: TRANSABDOMINAL AND TRANSVAGINAL ULTRASOUND OF PELVIS  TECHNIQUE: Both transabdominal and transvaginal ultrasound examinations of the pelvis were performed. Transabdominal technique was performed for global imaging of the pelvis including uterus, ovaries, adnexal regions, and pelvic cul-de-sac. It was necessary to proceed with endovaginal exam following the transabdominal exam to visualize the ovaries.  COMPARISON:  08/03/2013  FINDINGS: Uterus  Measurements: 10 x 6 x 6 cm. There is a sub serosal heterogeneous echogenicity mass in the anterior uterine body measuring up to 3 cm. This is consistent with fibroid.  Endometrium  Thickness: 9 mm.  No focal abnormality visualized.  Right ovary  Measurements: 5.7 x 3.7 x 3.9 cm. There is a 2.2 cm intra-ovarian cyst with lace-like internal architecture consistent with hemorrhagic cyst. The cyst has collapsed and decreased in complexity compared to previous.  Left ovary  Surgically absent.  Other findings  No free fluid.  IMPRESSION: 1. Contracting hemorrhagic cyst in the right ovary. 2. 3 cm sub serosal uterine fibroid.   Electronically Signed   By: Jorje Guild M.D.   On: 08/31/2013 02:31   US Pelvis Complete  08/31/2013   CLINICAL DATA:  Cramping and abdominal  pain.  EXAM: TRANSABDOMINAL AND TRANSVAGINAL ULTRASOUND OF PELVIS  TECHNIQUE: Both transabdominal and transvaginal ultrasound examinations of the pelvis were performed. Transabdominal technique was performed for global imaging of the pelvis including uterus, ovaries, adnexal regions, and pelvic cul-de-sac. It was necessary to proceed with endovaginal exam following the transabdominal exam to visualize the ovaries.  COMPARISON:  08/03/2013  FINDINGS: Uterus  Measurements: 10 x 6 x 6 cm. There is a sub serosal heterogeneous echogenicity mass in the anterior uterine body measuring up to 3 cm. This is consistent with fibroid.  Endometrium  Thickness: 9 mm.  No focal abnormality visualized.  Right ovary  Measurements: 5.7 x 3.7 x 3.9 cm. There is a 2.2 cm intra-ovarian cyst with lace-like internal architecture consistent with hemorrhagic cyst.  The cyst has collapsed and decreased in complexity compared to previous.  Left ovary  Surgically absent.  Other findings  No free fluid.  IMPRESSION: 1. Contracting hemorrhagic cyst in the right ovary. 2. 3 cm sub serosal uterine fibroid.   Electronically Signed   By: Jorje Guild M.D.   On: 08/31/2013 02:31    MDM Pt offered Tylenol; states "not worried about the pain"  Assessment and Plan  A: 1. Pelvic pain in female   2. Ovarian cyst     B: Discharged in stable condition Keep follow up with Dr. Harolyn Rutherford Discussed reasons to return to [place], [place]  08/31/2013, 1:26 AM

## 2013-08-31 NOTE — Discharge Instructions (Signed)
Ovarian Cyst An ovarian cyst is a sac filled with fluid or blood. This sac is attached to the ovary. Some cysts go away on their own. Other cysts need treatment.  HOME CARE   Only take medicine as told by your doctor.  Follow up with your doctor as told.  Get regular pelvic exams and Pap tests. GET HELP IF:  Your periods are late, not regular, or painful.  You stop having periods.  Your belly (abdominal) or pelvic pain does not go away.  Your belly becomes large or puffy (swollen).  You have a hard time peeing (totally emptying your bladder).  You have pressure on your bladder.  You have pain during sex.  You feel fullness, pressure, or discomfort in your belly.  You lose weight for no reason.  You feel sick most of the time.  You have a hard time pooping (constipation).  You do not feel like eating.  You develop pimples (acne).  You have an increase in hair on your body and face.  You are gaining weight for no reason.  You think you are pregnant. GET HELP RIGHT AWAY IF:   Your belly pain gets worse.  You feel sick to your stomach (nauseous), and you throw up (vomit).  You have a fever that comes on fast.  You have belly pain while pooping (bowel movement).  Your periods are heavier than usual. MAKE SURE YOU:   Understand these instructions.  Will watch your condition.  Will get help right away if you are not doing well or get worse. Document Released: 09/05/2007 Document Revised: 01/07/2013 Document Reviewed: 11/24/2012 [facility] Patient Information 2014 [place].

## 2013-09-07 ENCOUNTER — Encounter: Payer: Self-pay | Admitting: Obstetrics & Gynecology

## 2013-09-07 ENCOUNTER — Ambulatory Visit (INDEPENDENT_AMBULATORY_CARE_PROVIDER_SITE_OTHER): Payer: No Typology Code available for payment source | Admitting: Obstetrics & Gynecology

## 2013-09-07 VITALS — BP 126/85 | HR 85 | Ht 60.0 in | Wt 139.0 lb

## 2013-09-07 DIAGNOSIS — R102 Pelvic and perineal pain: Secondary | ICD-10-CM

## 2013-09-07 DIAGNOSIS — N979 Female infertility, unspecified: Secondary | ICD-10-CM

## 2013-09-07 DIAGNOSIS — N949 Unspecified condition associated with female genital organs and menstrual cycle: Secondary | ICD-10-CM

## 2013-09-07 DIAGNOSIS — N731 Chronic parametritis and pelvic cellulitis: Secondary | ICD-10-CM

## 2013-09-07 DIAGNOSIS — Z01419 Encounter for gynecological examination (general) (routine) without abnormal findings: Secondary | ICD-10-CM

## 2013-09-07 MED ORDER — HYDROCODONE-ACETAMINOPHEN 5-300 MG PO TABS: 1.0000 | ORAL_TABLET | Freq: Four times a day (QID) | ORAL | Status: DC | PRN

## 2013-09-07 NOTE — Patient Instructions (Signed)
Preventive Care for Adults, Female A healthy lifestyle and preventive care can promote health and wellness. Preventive health guidelines for women include the following key practices.  A routine yearly physical is a good way to check with your health care provider about your health and preventive screening. It is a chance to share any concerns and updates on your health and to receive a thorough exam.  Visit your dentist for a routine exam and preventive care every 6 months. Brush your teeth twice a day and floss once a day. Good oral hygiene prevents tooth decay and gum disease.  The frequency of eye exams is based on your age, health, family medical history, use of contact lenses, and other factors. Follow your health care provider's recommendations for frequency of eye exams.  Eat a healthy diet. Foods like vegetables, fruits, whole grains, low-fat dairy products, and lean protein foods contain the nutrients you need without too many calories. Decrease your intake of foods high in solid fats, added sugars, and salt. Eat the right amount of calories for you.Get information about a proper diet from your health care provider, if necessary.  Regular physical exercise is one of the most important things you can do for your health. Most adults should get at least 150 minutes of moderate-intensity exercise (any activity that increases your heart rate and causes you to sweat) each week. In addition, most adults need muscle-strengthening exercises on 2 or more days a week.  Maintain a healthy weight. The body mass index (BMI) is a screening tool to identify possible weight problems. It provides an estimate of body fat based on height and weight. Your health care provider can find your BMI, and can help you achieve or maintain a healthy weight.For adults 20 years and older:  A BMI below 18.5 is considered underweight.  A BMI of 18.5 to 24.9 is normal.  A BMI of 25 to 29.9 is considered  overweight.  A BMI of 30 and above is considered obese.  Maintain normal blood lipids and cholesterol levels by exercising and minimizing your intake of saturated fat. Eat a balanced diet with plenty of fruit and vegetables. Blood tests for lipids and cholesterol should begin at age 20 and be repeated every 5 years. If your lipid or cholesterol levels are high, you are over 50, or you are at high risk for heart disease, you may need your cholesterol levels checked more frequently.Ongoing high lipid and cholesterol levels should be treated with medicines if diet and exercise are not working.  If you smoke, find out from your health care provider how to quit. If you do not use tobacco, do not start.  Lung cancer screening is recommended for adults aged 55 80 years who are at high risk for developing lung cancer because of a history of smoking. A yearly low-dose CT scan of the lungs is recommended for people who have at least a 30-pack-year history of smoking and are a current smoker or have quit within the past 15 years. A pack year of smoking is smoking an average of 1 pack of cigarettes a day for 1 year (for example: 1 pack a day for 30 years or 2 packs a day for 15 years). Yearly screening should continue until the smoker has stopped smoking for at least 15 years. Yearly screening should be stopped for people who develop a health problem that would prevent them from having lung cancer treatment.  If you are pregnant, do not drink alcohol. If you   are breastfeeding, be very cautious about drinking alcohol. If you are not pregnant and choose to drink alcohol, do not have more than 1 drink per day. One drink is considered to be 12 ounces (355 mL) of beer, 5 ounces (148 mL) of wine, or 1.5 ounces (44 mL) of liquor.  Avoid use of street drugs. Do not share needles with anyone. Ask for help if you need support or instructions about stopping the use of drugs.  High blood pressure causes heart disease and  increases the risk of stroke. Your blood pressure should be checked at least every 1 to 2 years. Ongoing high blood pressure should be treated with medicines if weight loss and exercise do not work.  If you are 20 33 years old, ask your health care provider if you should take aspirin to prevent strokes.  Diabetes screening involves taking a blood sample to check your fasting blood sugar level. This should be done once every 3 years, after age 35, if you are within normal weight and without risk factors for diabetes. Testing should be considered at a younger age or be carried out more frequently if you are overweight and have at least 1 risk factor for diabetes.  Breast cancer screening is essential preventive care for women. You should practice "breast self-awareness." This means understanding the normal appearance and feel of your breasts and may include breast self-examination. Any changes detected, no matter how small, should be reported to a health care provider. Women in their 42s and 30s should have a clinical breast exam (CBE) by a health care provider as part of a regular health exam every 1 to 3 years. After age 74, women should have a CBE every year. Starting at age 43, women should consider having a mammogram (breast X-ray test) every year. Women who have a family history of breast cancer should talk to their health care provider about genetic screening. Women at a high risk of breast cancer should talk to their health care providers about having an MRI and a mammogram every year.  Breast cancer gene (BRCA)-related cancer risk assessment is recommended for women who have family members with BRCA-related cancers. BRCA-related cancers include breast, ovarian, tubal, and peritoneal cancers. Having family members with these cancers may be associated with an increased risk for harmful changes (mutations) in the breast cancer genes BRCA1 and BRCA2. Results of the assessment will determine the need for  genetic counseling and BRCA1 and BRCA2 testing.  The Pap test is a screening test for cervical cancer. A Pap test can show cell changes on the cervix that might become cervical cancer if left untreated. A Pap test is a procedure in which cells are obtained and examined from the lower end of the uterus (cervix).  Women should have a Pap test starting at age 60.  Between ages 63 and 62, Pap tests should be repeated every 2 years.  Beginning at age 43, you should have a Pap test every 3 years as long as the past 3 Pap tests have been normal.  Some women have medical problems that increase the chance of getting cervical cancer. Talk to your health care provider about these problems. It is especially important to talk to your health care provider if a new problem develops soon after your last Pap test. In these cases, your health care provider may recommend more frequent screening and Pap tests.  The above recommendations are the same for women who have or have not gotten the vaccine  for human papillomavirus (HPV).  If you had a hysterectomy for a problem that was not cancer or a condition that could lead to cancer, then you no longer need Pap tests. Even if you no longer need a Pap test, a regular exam is a good idea to make sure no other problems are starting.  If you are between ages 65 and 70 years, and you have had normal Pap tests going back 10 years, you no longer need Pap tests. Even if you no longer need a Pap test, a regular exam is a good idea to make sure no other problems are starting.  If you have had past treatment for cervical cancer or a condition that could lead to cancer, you need Pap tests and screening for cancer for at least 20 years after your treatment.  If Pap tests have been discontinued, risk factors (such as a new sexual partner) need to be reassessed to determine if screening should be resumed.  The HPV test is an additional test that may be used for cervical cancer  screening. The HPV test looks for the virus that can cause the cell changes on the cervix. The cells collected during the Pap test can be tested for HPV. The HPV test could be used to screen women aged 30 years and older, and should be used in women of any age who have unclear Pap test results. After the age of 30, women should have HPV testing at the same frequency as a Pap test.  Colorectal cancer can be detected and often prevented. Most routine colorectal cancer screening begins at the age of 50 years and continues through age 75 years. However, your health care provider may recommend screening at an earlier age if you have risk factors for colon cancer. On a yearly basis, your health care provider may provide home test kits to check for hidden blood in the stool. Use of a small camera at the end of a tube, to directly examine the colon (sigmoidoscopy or colonoscopy), can detect the earliest forms of colorectal cancer. Talk to your health care provider about this at age 50, when routine screening begins. Direct exam of the colon should be repeated every 5 10 years through age 75 years, unless early forms of pre-cancerous polyps or small growths are found.  People who are at an increased risk for hepatitis B should be screened for this virus. You are considered at high risk for hepatitis B if:  You were born in a country where hepatitis B occurs often. Talk with your health care provider about which countries are considered high risk.  Your parents were born in a high-risk country and you have not received a shot to protect against hepatitis B (hepatitis B vaccine).  You have HIV or AIDS.  You use needles to inject street drugs.  You live with, or have sex with, someone who has Hepatitis B.  You get hemodialysis treatment.  You take certain medicines for conditions like cancer, organ transplantation, and autoimmune conditions.  Hepatitis C blood testing is recommended for all people born from  1945 through 1965 and any individual with known risks for hepatitis C.  Practice safe sex. Use condoms and avoid high-risk sexual practices to reduce the spread of sexually transmitted infections (STIs). STIs include gonorrhea, chlamydia, syphilis, trichomonas, herpes, HPV, and human immunodeficiency virus (HIV). Herpes, HIV, and HPV are viral illnesses that have no cure. They can result in disability, cancer, and death. Sexually active women aged 25   years and younger should be checked for chlamydia. Older women with new or multiple partners should also be tested for chlamydia. Testing for other STIs is recommended if you are sexually active and at increased risk.  Osteoporosis is a disease in which the bones lose minerals and strength with aging. This can result in serious bone fractures or breaks. The risk of osteoporosis can be identified using a bone density scan. Women ages 65 years and over and women at risk for fractures or osteoporosis should discuss screening with their health care providers. Ask your health care provider whether you should take a calcium supplement or vitamin D to reduce the rate of osteoporosis.  Menopause can be associated with physical symptoms and risks. Hormone replacement therapy is available to decrease symptoms and risks. You should talk to your health care provider about whether hormone replacement therapy is right for you.  Use sunscreen. Apply sunscreen liberally and repeatedly throughout the day. You should seek shade when your shadow is shorter than you. Protect yourself by wearing long sleeves, pants, a wide-brimmed hat, and sunglasses year round, whenever you are outdoors.  Once a month, do a whole body skin exam, using a mirror to look at the skin on your back. Tell your health care provider of new moles, moles that have irregular borders, moles that are larger than a pencil eraser, or moles that have changed in shape or color.  Stay current with required  vaccines (immunizations).  Influenza vaccine. All adults should be immunized every year.  Tetanus, diphtheria, and acellular pertussis (Td, Tdap) vaccine. Pregnant women should receive 1 dose of Tdap vaccine during each pregnancy. The dose should be obtained regardless of the length of time since the last dose. Immunization is preferred during the 27th 36th week of gestation. An adult who has not previously received Tdap or who does not know her vaccine status should receive 1 dose of Tdap. This initial dose should be followed by tetanus and diphtheria toxoids (Td) booster doses every 10 years. Adults with an unknown or incomplete history of completing a 3-dose immunization series with Td-containing vaccines should begin or complete a primary immunization series including a Tdap dose. Adults should receive a Td booster every 10 years.  Varicella vaccine. An adult without evidence of immunity to varicella should receive 2 doses or a second dose if she has previously received 1 dose. Pregnant females who do not have evidence of immunity should receive the first dose after pregnancy. This first dose should be obtained before leaving the health care facility. The second dose should be obtained 4 8 weeks after the first dose.  Human papillomavirus (HPV) vaccine. Females aged 13 26 years who have not received the vaccine previously should obtain the 3-dose series. The vaccine is not recommended for use in pregnant females. However, pregnancy testing is not needed before receiving a dose. If a female is found to be pregnant after receiving a dose, no treatment is needed. In that case, the remaining doses should be delayed until after the pregnancy. Immunization is recommended for any person with an immunocompromised condition through the age of 26 years if she did not get any or all doses earlier. During the 3-dose series, the second dose should be obtained 4 8 weeks after the first dose. The third dose should be  obtained 24 weeks after the first dose and 16 weeks after the second dose.  Zoster vaccine. One dose is recommended for adults aged 60 years or older unless certain   conditions are present.  Measles, mumps, and rubella (MMR) vaccine. Adults born before 1957 generally are considered immune to measles and mumps. Adults born in 1957 or later should have 1 or more doses of MMR vaccine unless there is a contraindication to the vaccine or there is laboratory evidence of immunity to each of the three diseases. A routine second dose of MMR vaccine should be obtained at least 28 days after the first dose for students attending postsecondary schools, health care workers, or international travelers. People who received inactivated measles vaccine or an unknown type of measles vaccine during 1963 1967 should receive 2 doses of MMR vaccine. People who received inactivated mumps vaccine or an unknown type of mumps vaccine before 1979 and are at high risk for mumps infection should consider immunization with 2 doses of MMR vaccine. For females of childbearing age, rubella immunity should be determined. If there is no evidence of immunity, females who are not pregnant should be vaccinated. If there is no evidence of immunity, females who are pregnant should delay immunization until after pregnancy. Unvaccinated health care workers born before 1957 who lack laboratory evidence of measles, mumps, or rubella immunity or laboratory confirmation of disease should consider measles and mumps immunization with 2 doses of MMR vaccine or rubella immunization with 1 dose of MMR vaccine.  Pneumococcal 13-valent conjugate (PCV13) vaccine. When indicated, a person who is uncertain of her immunization history and has no record of immunization should receive the PCV13 vaccine. An adult aged 19 years or older who has certain medical conditions and has not been previously immunized should receive 1 dose of PCV13 vaccine. This PCV13 should be  followed with a dose of pneumococcal polysaccharide (PPSV23) vaccine. The PPSV23 vaccine dose should be obtained at least 8 weeks after the dose of PCV13 vaccine. An adult aged 19 years or older who has certain medical conditions and previously received 1 or more doses of PPSV23 vaccine should receive 1 dose of PCV13. The PCV13 vaccine dose should be obtained 1 or more years after the last PPSV23 vaccine dose.  Pneumococcal polysaccharide (PPSV23) vaccine. When PCV13 is also indicated, PCV13 should be obtained first. All adults aged 65 years and older should be immunized. An adult younger than age 65 years who has certain medical conditions should be immunized. Any person who resides in a nursing home or long-term care facility should be immunized. An adult smoker should be immunized. People with an immunocompromised condition and certain other conditions should receive both PCV13 and PPSV23 vaccines. People with human immunodeficiency virus (HIV) infection should be immunized as soon as possible after diagnosis. Immunization during chemotherapy or radiation therapy should be avoided. Routine use of PPSV23 vaccine is not recommended for American Indians, [place] Natives, or people younger than 65 years unless there are medical conditions that require PPSV23 vaccine. When indicated, people who have unknown immunization and have no record of immunization should receive PPSV23 vaccine. One-time revaccination 5 years after the first dose of PPSV23 is recommended for people aged 19 64 years who have chronic kidney failure, nephrotic syndrome, asplenia, or immunocompromised conditions. People who received 1 2 doses of PPSV23 before age 65 years should receive another dose of PPSV23 vaccine at age 65 years or later if at least 5 years have passed since the previous dose. Doses of PPSV23 are not needed for people immunized with PPSV23 at or after age 65 years.  Meningococcal vaccine. Adults with asplenia or persistent  complement component deficiencies should receive 2   doses of quadrivalent meningococcal conjugate (MenACWY-D) vaccine. The doses should be obtained at least 2 months apart. Microbiologists working with certain meningococcal bacteria, military recruits, people at risk during an outbreak, and people who travel to or live in countries with a high rate of meningitis should be immunized. A first-year college student up through age 21 years who is living in a residence hall should receive a dose if she did not receive a dose on or after her 16th birthday. Adults who have certain high-risk conditions should receive one or more doses of vaccine.  Hepatitis A vaccine. Adults who wish to be protected from this disease, have certain high-risk conditions, work with hepatitis A-infected animals, work in hepatitis A research labs, or travel to or work in countries with a high rate of hepatitis A should be immunized. Adults who were previously unvaccinated and who anticipate close contact with an international adoptee during the first 60 days after arrival in the [place] from a country with a high rate of hepatitis A should be immunized.  Hepatitis B vaccine. Adults who wish to be protected from this disease, have certain high-risk conditions, may be exposed to blood or other infectious body fluids, are household contacts or sex partners of hepatitis B positive people, are clients or workers in certain care facilities, or travel to or work in countries with a high rate of hepatitis B should be immunized.  Haemophilus influenzae type b (Hib) vaccine. A previously unvaccinated person with asplenia or sickle cell disease or having a scheduled splenectomy should receive 1 dose of Hib vaccine. Regardless of previous immunization, a recipient of a hematopoietic stem cell transplant should receive a 3-dose series 6 12 months after her successful transplant. Hib vaccine is not recommended for adults with HIV  infection. Preventive Services / Frequency Ages 19 to 39years  Blood pressure check.** / Every 1 to 2 years.  Lipid and cholesterol check.** / Every 5 years beginning at age 20.  Clinical breast exam.** / Every 3 years for women in their 20s and 30s.  BRCA-related cancer risk assessment.** / For women who have family members with a BRCA-related cancer (breast, ovarian, tubal, or peritoneal cancers).  Pap test.** / Every 2 years from ages 21 through 29. Every 3 years starting at age 30 through age 65 or 70 with a history of 3 consecutive normal Pap tests.  HPV screening.** / Every 3 years from ages 30 through ages 65 to 70 with a history of 3 consecutive normal Pap tests.  Hepatitis C blood test.** / For any individual with known risks for hepatitis C.  Skin self-exam. / Monthly.  Influenza vaccine. / Every year.  Tetanus, diphtheria, and acellular pertussis (Tdap, Td) vaccine.** / Consult your health care provider. Pregnant women should receive 1 dose of Tdap vaccine during each pregnancy. 1 dose of Td every 10 years.  Varicella vaccine.** / Consult your health care provider. Pregnant females who do not have evidence of immunity should receive the first dose after pregnancy.  HPV vaccine. / 3 doses over 6 months, if 26 and younger. The vaccine is not recommended for use in pregnant females. However, pregnancy testing is not needed before receiving a dose.  Measles, mumps, rubella (MMR) vaccine.** / You need at least 1 dose of MMR if you were born in 1957 or later. You may also need a 2nd dose. For females of childbearing age, rubella immunity should be determined. If there is no evidence of immunity, females who are not   pregnant should be vaccinated. If there is no evidence of immunity, females who are pregnant should delay immunization until after pregnancy.  Pneumococcal 13-valent conjugate (PCV13) vaccine.** / Consult your health care provider.  Pneumococcal polysaccharide (PPSV23)  vaccine.** / 1 to 2 doses if you smoke cigarettes or if you have certain conditions.  Meningococcal vaccine.** / 1 dose if you are age 88 to 41 years and a Market researcher living in a residence hall, or have one of several medical conditions, you need to get vaccinated against meningococcal disease. You may also need additional booster doses.  Hepatitis A vaccine.** / Consult your health care provider.  Hepatitis B vaccine.** / Consult your health care provider.  Haemophilus influenzae type b (Hib) vaccine.** / Consult your health care provider. Ages 45 to 64years  Blood pressure check.** / Every 1 to 2 years.  Lipid and cholesterol check.** / Every 5 years beginning at age 2 years.  Lung cancer screening. / Every year if you are aged 10 80 years and have a 30-pack-year history of smoking and currently smoke or have quit within the past 15 years. Yearly screening is stopped once you have quit smoking for at least 15 years or develop a health problem that would prevent you from having lung cancer treatment.  Clinical breast exam.** / Every year after age 28 years.  BRCA-related cancer risk assessment.** / For women who have family members with a BRCA-related cancer (breast, ovarian, tubal, or peritoneal cancers).  Mammogram.** / Every year beginning at age 63 years and continuing for as long as you are in good health. Consult with your health care provider.  Pap test.** / Every 3 years starting at age 71 years through age 39 or 90 years with a history of 3 consecutive normal Pap tests.  HPV screening.** / Every 3 years from ages 27 years through ages 32 to 72 years with a history of 3 consecutive normal Pap tests.  Fecal occult blood test (FOBT) of stool. / Every year beginning at age 40 years and continuing until age 29 years. You may not need to do this test if you get a colonoscopy every 10 years.  Flexible sigmoidoscopy or colonoscopy.** / Every 5 years for a flexible  sigmoidoscopy or every 10 years for a colonoscopy beginning at age 66 years and continuing until age 47 years.  Hepatitis C blood test.** / For all people born from [birth year] through 1965 and any individual with known risks for hepatitis C.  Skin self-exam. / Monthly.  Influenza vaccine. / Every year.  Tetanus, diphtheria, and acellular pertussis (Tdap/Td) vaccine.** / Consult your health care provider. Pregnant women should receive 1 dose of Tdap vaccine during each pregnancy. 1 dose of Td every 10 years.  Varicella vaccine.** / Consult your health care provider. Pregnant females who do not have evidence of immunity should receive the first dose after pregnancy.  Zoster vaccine.** / 1 dose for adults aged 39 years or older.  Measles, mumps, rubella (MMR) vaccine.** / You need at least 1 dose of MMR if you were born in 1957 or later. You may also need a 2nd dose. For females of childbearing age, rubella immunity should be determined. If there is no evidence of immunity, females who are not pregnant should be vaccinated. If there is no evidence of immunity, females who are pregnant should delay immunization until after pregnancy.  Pneumococcal 13-valent conjugate (PCV13) vaccine.** / Consult your health care provider.  Pneumococcal polysaccharide (PPSV23) vaccine.** / 1  to 2 doses if you smoke cigarettes or if you have certain conditions.  Meningococcal vaccine.** / Consult your health care provider.  Hepatitis A vaccine.** / Consult your health care provider.  Hepatitis B vaccine.** / Consult your health care provider.  Haemophilus influenzae type b (Hib) vaccine.** / Consult your health care provider. Ages 65 years and over  Blood pressure check.** / Every 1 to 2 years.  Lipid and cholesterol check.** / Every 5 years beginning at age 20 years.  Lung cancer screening. / Every year if you are aged 55 80 years and have a 30-pack-year history of smoking and currently smoke or have quit within the past 15 years.  Yearly screening is stopped once you have quit smoking for at least 15 years or develop a health problem that would prevent you from having lung cancer treatment.  Clinical breast exam.** / Every year after age 40 years.  BRCA-related cancer risk assessment.** / For women who have family members with a BRCA-related cancer (breast, ovarian, tubal, or peritoneal cancers).  Mammogram.** / Every year beginning at age 40 years and continuing for as long as you are in good health. Consult with your health care provider.  Pap test.** / Every 3 years starting at age 30 years through age 65 or 70 years with 3 consecutive normal Pap tests. Testing can be stopped between 65 and 70 years with 3 consecutive normal Pap tests and no abnormal Pap or HPV tests in the past 10 years.  HPV screening.** / Every 3 years from ages 30 years through ages 65 or 70 years with a history of 3 consecutive normal Pap tests. Testing can be stopped between 65 and 70 years with 3 consecutive normal Pap tests and no abnormal Pap or HPV tests in the past 10 years.  Fecal occult blood test (FOBT) of stool. / Every year beginning at age 50 years and continuing until age 75 years. You may not need to do this test if you get a colonoscopy every 10 years.  Flexible sigmoidoscopy or colonoscopy.** / Every 5 years for a flexible sigmoidoscopy or every 10 years for a colonoscopy beginning at age 50 years and continuing until age 75 years.  Hepatitis C blood test.** / For all people born from 1945 through 1965 and any individual with known risks for hepatitis C.  Osteoporosis screening.** / A one-time screening for women ages 65 years and over and women at risk for fractures or osteoporosis.  Skin self-exam. / Monthly.  Influenza vaccine. / Every year.  Tetanus, diphtheria, and acellular pertussis (Tdap/Td) vaccine.** / 1 dose of Td every 10 years.  Varicella vaccine.** / Consult your health care provider.  Zoster vaccine.** / 1  dose for adults aged 60 years or older.  Pneumococcal 13-valent conjugate (PCV13) vaccine.** / Consult your health care provider.  Pneumococcal polysaccharide (PPSV23) vaccine.** / 1 dose for all adults aged 65 years and older.  Meningococcal vaccine.** / Consult your health care provider.  Hepatitis A vaccine.** / Consult your health care provider.  Hepatitis B vaccine.** / Consult your health care provider.  Haemophilus influenzae type b (Hib) vaccine.** / Consult your health care provider. ** Family history and personal history of risk and conditions may change your health care provider's recommendations. Document Released: 05/15/2001 Document Revised: 01/07/2013 Document Reviewed: 08/14/2010 Exit[employer] Patient Information 2014 [employer].  

## 2013-09-07 NOTE — Progress Notes (Signed)
    GYNECOLOGY CLINIC ANNUAL PREVENTATIVE CARE ENCOUNTER NOTE  Subjective:     [name] [name] is a 33 y.o. G68P0030 female here for a routine annual gynecologic exam.  Current complaints: continued occasional pelvic pain due to chronic pain 2/2 chronic pain. Has not taken Percocet in a long time, wants another less strong prescription for pain.  Also has menorrhagia, declines OCPs for management.  Wants refill of Femara, not seeing Kerin Perna anymore due to insurance issues.   Gynecologic History Patient's last menstrual period was 08/09/2013. Last Pap: 06/01/2010. Results were: normal  Obstetric History OB History  Gravida Para Term Preterm AB SAB TAB Ectopic Multiple Living  3 0 0 0 3 3 0 0 0 0     # Outcome Date GA Lbr Len/2nd Weight Sex Delivery Anes PTL Lv  3 SAB           2 SAB           1 SAB              The following portions of the patient's history were reviewed and updated as appropriate: allergies, current medications, past family history, past medical history, past social history, past surgical history and problem list.  Review of Systems Pertinent items are noted in HPI.    Objective:   BP 126/85  Pulse 85  Ht 5' (1.524 m)  Wt 139 lb (63.05 kg)  BMI 27.15 kg/m2  LMP 08/09/2013 GENERAL: Well-developed, well-nourished female in no acute distress.  HEENT: Normocephalic, atraumatic. Sclerae anicteric.  NECK: Supple. Normal thyroid.  LUNGS: Clear to auscultation bilaterally.  HEART: Regular rate and rhythm. BREASTS: Symmetric in size. No masses, skin changes, nipple drainage, or lymphadenopathy. ABDOMEN: Soft, nontender, nondistended. No organomegaly. PELVIC: Normal external female genitalia. Vagina is pink and rugated.  Normal discharge. Normal cervix contour. Pap smear obtained. Uterus is normal in size. Diffuse tenderness during bimanual exam. EXTREMITIES: No cyanosis, clubbing, or edema, 2+ distal pulses.  Assessment:   Annual gynecologic  examination Chronic pelvic pain due to chronic PID Infertility Menorrhagia   Plan:   Pap done, will follow up results and manage accordingly. Vicodin prescribed for pain for now Will call with dosage of Femara and will prescribe accordingly Routine preventative health maintenance measures emphasized   Verita Schneiders, MD, [place] Attending [place], [place]

## 2013-09-08 ENCOUNTER — Telehealth: Payer: Self-pay

## 2013-09-08 DIAGNOSIS — N979 Female infertility, unspecified: Secondary | ICD-10-CM

## 2013-09-08 NOTE — Telephone Encounter (Signed)
Pt called and stated that she wanted to let Dr. Harolyn Rutherford know that she found the medication and on the bottle it states "megastrol 2.5 mg tablets, take two tablets by mouth on cycle days 3-7 qty 10".

## 2013-09-09 LAB — CYTOLOGY - PAP

## 2013-09-09 MED ORDER — LETROZOLE 2.5 MG PO TABS: 5.0000 mg | ORAL_TABLET | Freq: Every day | ORAL | Status: DC

## 2013-09-09 NOTE — Telephone Encounter (Signed)
During our visit, patient mentioned Femara (letrozole); it is not megestrol (progesterone). Also, this is not a dosage used for megestrol anyway.  Letrozole prescribed.

## 2013-09-10 ENCOUNTER — Other Ambulatory Visit: Payer: Self-pay | Admitting: Advanced Practice Midwife

## 2013-09-11 NOTE — Telephone Encounter (Signed)
Called pt to make sure the she picked up her medication.  Pt informed that she has picked up her medication.  I advised her to continue to take medication as prescribed and if she has any questions concerning management then to bring to her f/u appt.  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  I advised pt to bring paperwork in, that she would need to fill out [employer] and then to please give [place] 7-10 business days to complete , and we will call her when it is completed.  Pt stated understanding with no further questions.

## 2013-09-12 NOTE — MAU Provider Note (Signed)
History     This note is copied from a previous ED provider note so that it can be in a MAU provider note format. CSN: [id number]  Arrival date and time: 08/30/13 2345   First Provider Initiated Contact with Patient 08/31/13 0056      Chief Complaint  Patient presents with  . Abdominal Pain   HPI  HPI  [name] [name] is a G48P0030, 33 y.o. female who presents with abdominal pain & vaginal discharge.  Pt reports pelvic pain since Thursday; states constant but has been getting worse. Rates as 10/10 & sharp. Took Tylenol with no relief; movement makes pain worse. Increased vaginal discharge, white & clumpy, since Saturday; vaginal irritation. Denies vaginal bleeding. Nausea, no vomiting. Pelvic pain with urination since Thursday. Denies burning or increased frequency. Denies painful intercourse. Denies fever.    Past Medical History  Diagnosis Date  . Ovarian cyst   . Abnormal Pap smear   . PID (pelvic inflammatory disease)   . Gonorrhea   . Trichomonas   . Fibroids     Past Surgical History  Procedure Laterality Date  . Diagnostic laparoscopy  May 2012  . Laparoscopy N/A 06/26/2012    Procedure: LAPAROSCOPY OPERATIVE with CHROMOPERTUBATION;  Surgeon: Osborne Oman, MD;  Location: [facility];  Service: Gynecology;  Laterality: N/A;  . Salpingoophorectomy Left 06/26/2012    Procedure: SALPINGO OOPHORECTOMY;  Surgeon: Osborne Oman, MD;  Location: [facility];  Service: Gynecology;  Laterality: Left;    Family History  Problem Relation Age of Onset  . Anesthesia problems Neg Hx   . Cystic fibrosis Mother   . Hypertension Mother   . Diabetes Father     History  Substance Use Topics  . Smoking status: Current Every Day Smoker -- 1.00 packs/day for 10 years    Types: Cigarettes  . Smokeless tobacco: Never Used  . Alcohol Use: Yes     Comment: occ-  SOCIAL-    DEC LAST TIME.    Allergies:  Allergies  Allergen Reactions  . Peanut-Containing Drug Products Anaphylaxis   . Spinach Anaphylaxis    Canned spinach  . Nsaids Hives, Itching and Nausea And Vomiting    All NSAIDs cause this reaction. Benadryl does not help. Ibuprofen also causes personality changes.   . Tramadol Itching and Nausea And Vomiting    No prescriptions prior to admission    ROS  Review of Systems  Constitutional: Negative.  Gastrointestinal: Positive for nausea and abdominal pain. Negative for vomiting, constipation and blood in stool.  Genitourinary: Positive for dysuria. Negative for urgency, frequency and hematuria.    Physical Exam    Physical Exam  Blood pressure 101/63, pulse 85, temperature 98.9 F (37.2 C), temperature source Oral, resp. rate 18, height 5' (1.524 m), weight 144 lb (65.318 kg), last menstrual period 08/09/2013, SpO2 99.00%.  Physical Exam  Constitutional: She appears well-developed and well-nourished.  GI: Soft. Bowel sounds are normal. She exhibits no distension and no mass. There is tenderness. There is no rebound and no guarding.  Skin: Skin is warm and dry.  Pelvic exam:  External: no lesion  Vagina: small amount of white discharge  Cervix: pink, smooth, no CMT  Uterus: NSSC  Adnexa: NT   MAU Course  Procedures  Results for orders placed during the hospital encounter of 08/30/13 (from the past 24 hour(s))   URINALYSIS, ROUTINE W REFLEX MICROSCOPIC Status: None    Collection Time    08/31/13 12:01 AM  Result  Value  Ref Range    Color, Urine  YELLOW  YELLOW    APPearance  CLEAR  CLEAR    Specific Gravity, Urine  1.025  1.005 - 1.030    pH  6.0  5.0 - 8.0    Glucose, UA  NEGATIVE  NEGATIVE mg/dL    Hgb urine dipstick  NEGATIVE  NEGATIVE    Bilirubin Urine  NEGATIVE  NEGATIVE    Ketones, ur  NEGATIVE  NEGATIVE mg/dL    Protein, ur  NEGATIVE  NEGATIVE mg/dL    Urobilinogen, UA  0.2  0.0 - 1.0 mg/dL    Nitrite  NEGATIVE  NEGATIVE    Leukocytes, UA  NEGATIVE  NEGATIVE   POCT PREGNANCY, URINE Status: None    Collection Time     08/31/13 12:17 AM   Result  Value  Ref Range    Preg Test, Ur  NEGATIVE  NEGATIVE    US Transvaginal Non-ob  08/31/2013 CLINICAL DATA: Cramping and abdominal pain. EXAM: TRANSABDOMINAL AND TRANSVAGINAL ULTRASOUND OF PELVIS TECHNIQUE: Both transabdominal and transvaginal ultrasound examinations of the pelvis were performed. Transabdominal technique was performed for global imaging of the pelvis including uterus, ovaries, adnexal regions, and pelvic cul-de-sac. It was necessary to proceed with endovaginal exam following the transabdominal exam to visualize the ovaries. COMPARISON: 08/03/2013 FINDINGS: Uterus Measurements: 10 x 6 x 6 cm. There is a sub serosal heterogeneous echogenicity mass in the anterior uterine body measuring up to 3 cm. This is consistent with fibroid. Endometrium Thickness: 9 mm. No focal abnormality visualized. Right ovary Measurements: 5.7 x 3.7 x 3.9 cm. There is a 2.2 cm intra-ovarian cyst with lace-like internal architecture consistent with hemorrhagic cyst. The cyst has collapsed and decreased in complexity compared to previous. Left ovary Surgically absent. Other findings No free fluid. IMPRESSION: 1. Contracting hemorrhagic cyst in the right ovary. 2. 3 cm sub serosal uterine fibroid. Electronically Signed By: Jorje Guild M.D. On: 08/31/2013 02:31   US Pelvis Complete  08/31/2013 CLINICAL DATA: Cramping and abdominal pain. EXAM: TRANSABDOMINAL AND TRANSVAGINAL ULTRASOUND OF PELVIS TECHNIQUE: Both transabdominal and transvaginal ultrasound examinations of the pelvis were performed. Transabdominal technique was performed for global imaging of the pelvis including uterus, ovaries, adnexal regions, and pelvic cul-de-sac. It was necessary to proceed with endovaginal exam following the transabdominal exam to visualize the ovaries. COMPARISON: 08/03/2013 FINDINGS: Uterus Measurements: 10 x 6 x 6 cm. There is a sub serosal heterogeneous echogenicity mass in the anterior uterine body  measuring up to 3 cm. This is consistent with fibroid. Endometrium Thickness: 9 mm. No focal abnormality visualized. Right ovary Measurements: 5.7 x 3.7 x 3.9 cm. There is a 2.2 cm intra-ovarian cyst with lace-like internal architecture consistent with hemorrhagic cyst. The cyst has collapsed and decreased in complexity compared to previous. Left ovary Surgically absent. Other findings No free fluid. IMPRESSION: 1. Contracting hemorrhagic cyst in the right ovary. 2. 3 cm sub serosal uterine fibroid. Electronically Signed By: Jorje Guild M.D. On: 08/31/2013 02:31  MDM  Pt offered Tylenol; states "not worried about the pain"   Assessment and Plan   A:    1.  Pelvic pain in female    2.  Ovarian cyst    B:  Discharged in stable condition  Keep follow up with Dr. Harolyn Rutherford  Discussed reasons to return to [place], [place]  08/31/2013, 1:26 AM        I have seen the patient with the resident/student  and agree with the above.    Mathis Bud 09/12/2013, 2:35 PM

## 2013-10-05 ENCOUNTER — Telehealth: Payer: Self-pay | Admitting: *Deleted

## 2013-10-05 NOTE — Telephone Encounter (Signed)
Pt called and left message stating that she needs to sign Release of Information consent so that her records can be sent to Dr. Garwin Brothers. She has an appt with her on 7/9. 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 I returned pt's call and advised her that she needs to come to the clinic to sign [facility] consent. She stated that she can do that tomorrow morning. She will also bring the information for full disclosure that she is requesting. She wants a letter stating that she has ovarian cysts that come and go which she has no control over. She also wants the letter to state that these cysts cause pain and she may not be able to attend class at times. She will write everything down for [place]. I advised pt that we will review her letter request once she has provided the information to [place]. It may take up to one week for the letter to be prepared. Pt voiced understanding.

## 2013-10-07 ENCOUNTER — Telehealth: Payer: Self-pay | Admitting: *Deleted

## 2013-10-07 NOTE — Telephone Encounter (Signed)
Contacted patient to discuss Dr. Harolyn Rutherford decision regarding release from work/school.  Informed patient that Dr. Harolyn Rutherford reviewed her information and could not find any medical reason to grant her request.  Pt verbalizes understanding.

## 2013-10-28 ENCOUNTER — Encounter: Payer: Self-pay | Admitting: General Practice

## 2013-10-30 ENCOUNTER — Emergency Department (HOSPITAL_COMMUNITY)
Admission: EM | Admit: 2013-10-30 | Discharge: 2013-10-30 | Disposition: A | Discharge status: Home / Self Care | Payer: No Typology Code available for payment source | Attending: Emergency Medicine | Admitting: Emergency Medicine

## 2013-10-30 ENCOUNTER — Encounter (HOSPITAL_COMMUNITY): Payer: Self-pay | Admitting: Emergency Medicine

## 2013-10-30 DIAGNOSIS — A084 Viral intestinal infection, unspecified: Secondary | ICD-10-CM

## 2013-10-30 DIAGNOSIS — F17200 Nicotine dependence, unspecified, uncomplicated: Secondary | ICD-10-CM | POA: Insufficient documentation

## 2013-10-30 DIAGNOSIS — Z8742 Personal history of other diseases of the female genital tract: Secondary | ICD-10-CM | POA: Insufficient documentation

## 2013-10-30 DIAGNOSIS — Z3202 Encounter for pregnancy test, result negative: Secondary | ICD-10-CM | POA: Insufficient documentation

## 2013-10-30 DIAGNOSIS — R112 Nausea with vomiting, unspecified: Secondary | ICD-10-CM | POA: Insufficient documentation

## 2013-10-30 DIAGNOSIS — Z79899 Other long term (current) drug therapy: Secondary | ICD-10-CM | POA: Insufficient documentation

## 2013-10-30 DIAGNOSIS — A088 Other specified intestinal infections: Secondary | ICD-10-CM | POA: Insufficient documentation

## 2013-10-30 LAB — URINALYSIS, ROUTINE W REFLEX MICROSCOPIC
Bilirubin Urine: NEGATIVE
GLUCOSE, UA: NEGATIVE mg/dL
KETONES UR: NEGATIVE mg/dL
Leukocytes, UA: NEGATIVE
Nitrite: NEGATIVE
PROTEIN: NEGATIVE mg/dL
Specific Gravity, Urine: 1.029 (ref 1.005–1.030)
UROBILINOGEN UA: 0.2 mg/dL (ref 0.0–1.0)
pH: 5.5 (ref 5.0–8.0)

## 2013-10-30 LAB — CBC WITH DIFFERENTIAL/PLATELET
BASOS PCT: 0 % (ref 0–1)
Basophils Absolute: 0 10*3/uL (ref 0.0–0.1)
EOS ABS: 0.2 10*3/uL (ref 0.0–0.7)
Eosinophils Relative: 3 % (ref 0–5)
HCT: 38.3 % (ref 36.0–46.0)
Hemoglobin: 13.1 g/dL (ref 12.0–15.0)
LYMPHS ABS: 1.6 10*3/uL (ref 0.7–4.0)
Lymphocytes Relative: 30 % (ref 12–46)
MCH: 27.8 pg (ref 26.0–34.0)
MCHC: 34.2 g/dL (ref 30.0–36.0)
MCV: 81.3 fL (ref 78.0–100.0)
MONO ABS: 0.6 10*3/uL (ref 0.1–1.0)
MONOS PCT: 11 % (ref 3–12)
Neutro Abs: 2.8 10*3/uL (ref 1.7–7.7)
Neutrophils Relative %: 56 % (ref 43–77)
Platelets: 322 10*3/uL (ref 150–400)
RBC: 4.71 MIL/uL (ref 3.87–5.11)
RDW: 13.4 % (ref 11.5–15.5)
WBC: 5.1 10*3/uL (ref 4.0–10.5)

## 2013-10-30 LAB — URINE MICROSCOPIC-ADD ON

## 2013-10-30 LAB — COMPREHENSIVE METABOLIC PANEL
ALBUMIN: 3.9 g/dL (ref 3.5–5.2)
ALK PHOS: 73 U/L (ref 39–117)
ALT: 28 U/L (ref 0–35)
ANION GAP: 13 (ref 5–15)
AST: 26 U/L (ref 0–37)
BUN: 7 mg/dL (ref 6–23)
CO2: 24 mEq/L (ref 19–32)
CREATININE: 0.89 mg/dL (ref 0.50–1.10)
Calcium: 9.9 mg/dL (ref 8.4–10.5)
Chloride: 99 mEq/L (ref 96–112)
GFR calc non Af Amer: 84 mL/min — ABNORMAL LOW (ref >90)
GLUCOSE: 95 mg/dL (ref 70–99)
POTASSIUM: 4.2 meq/L (ref 3.7–5.3)
Sodium: 136 mEq/L — ABNORMAL LOW (ref 137–147)
Total Bilirubin: 0.2 mg/dL — ABNORMAL LOW (ref 0.3–1.2)
Total Protein: 8.4 g/dL — ABNORMAL HIGH (ref 6.0–8.3)

## 2013-10-30 LAB — LIPASE, BLOOD: LIPASE: 26 U/L (ref 11–59)

## 2013-10-30 LAB — POC URINE PREG, ED: Preg Test, Ur: NEGATIVE

## 2013-10-30 MED ORDER — ONDANSETRON HCL 4 MG PO TABS: 4.0000 mg | ORAL_TABLET | Freq: Four times a day (QID) | ORAL | Status: DC

## 2013-10-30 MED ORDER — SODIUM CHLORIDE 0.9 % IV BOLUS (SEPSIS)
500.0000 mL | Freq: Once | INTRAVENOUS | Status: AC
  Administered 2013-10-30: 500 mL via INTRAVENOUS

## 2013-10-30 MED ORDER — ONDANSETRON HCL 4 MG/2ML IJ SOLN
4.0000 mg | Freq: Once | INTRAMUSCULAR | Status: AC
  Administered 2013-10-30: 4 mg via INTRAVENOUS
  Filled 2013-10-30: qty 2

## 2013-10-30 NOTE — Discharge Instructions (Signed)
Viral Gastroenteritis °Viral gastroenteritis is also known as stomach flu. This condition affects the stomach and intestinal tract. It can cause sudden diarrhea and vomiting. The illness typically lasts 3 to 8 days. Most people develop an immune response that eventually gets rid of the virus. While this natural response develops, the virus can make you quite ill. °CAUSES  °Many different viruses can cause gastroenteritis, such as rotavirus or noroviruses. You can catch one of these viruses by consuming contaminated food or water. You may also catch a virus by sharing utensils or other personal items with an infected person or by touching a contaminated surface. °SYMPTOMS  °The most common symptoms are diarrhea and vomiting. These problems can cause a severe loss of body fluids (dehydration) and a body salt (electrolyte) imbalance. Other symptoms may include: °· Fever. °· Headache. °· Fatigue. °· Abdominal pain. °DIAGNOSIS  °Your caregiver can usually diagnose viral gastroenteritis based on your symptoms and a physical exam. A stool sample may also be taken to test for the presence of viruses or other infections. °TREATMENT  °This illness typically goes away on its own. Treatments are aimed at rehydration. The most serious cases of viral gastroenteritis involve vomiting so severely that you are not able to keep fluids down. In these cases, fluids must be given through an intravenous line (IV). °HOME CARE INSTRUCTIONS  °· Drink enough fluids to keep your urine clear or pale yellow. Drink small amounts of fluids frequently and increase the amounts as tolerated. °· Ask your caregiver for specific rehydration instructions. °· Avoid: °¨ Foods high in sugar. °¨ Alcohol. °¨ Carbonated drinks. °¨ Tobacco. °¨ Juice. °¨ Caffeine drinks. °¨ Extremely hot or cold fluids. °¨ Fatty, greasy foods. °¨ Too much intake of anything at one time. °¨ Dairy products until 24 to 48 hours after diarrhea stops. °· You may consume probiotics.  Probiotics are active cultures of beneficial bacteria. They may lessen the amount and number of diarrheal stools in adults. Probiotics can be found in yogurt with active cultures and in supplements. °· Wash your hands well to avoid spreading the virus. °· Only take over-the-counter or prescription medicines for pain, discomfort, or fever as directed by your caregiver. Do not give aspirin to children. Antidiarrheal medicines are not recommended. °· Ask your caregiver if you should continue to take your regular prescribed and over-the-counter medicines. °· Keep all follow-up appointments as directed by your caregiver. °SEEK IMMEDIATE MEDICAL CARE IF:  °· You are unable to keep fluids down. °· You do not urinate at least once every 6 to 8 hours. °· You develop shortness of breath. °· You notice blood in your stool or vomit. This may look like coffee grounds. °· You have abdominal pain that increases or is concentrated in one small area (localized). °· You have persistent vomiting or diarrhea. °· You have a fever. °· The patient is a child younger than 3 months, and he or she has a fever. °· The patient is a child older than 3 months, and he or she has a fever and persistent symptoms. °· The patient is a child older than 3 months, and he or she has a fever and symptoms suddenly get worse. °· The patient is a baby, and he or she has no tears when crying. °MAKE SURE YOU:  °· Understand these instructions. °· Will watch your condition. °· Will get help right away if you are not doing well or get worse. °Document Released: 03/19/2005 Document Revised: 06/11/2011 Document Reviewed: 01/03/2011 °  ExitCare Patient Information 2015 [place]. This information is not intended to replace advice given to you by your health care provider. Make sure you discuss any questions you have with your health care provider.       Emergency Department Resource Guide 1) Find a Doctor and Pay Out of Pocket Although you won't have  to find out who is covered by your insurance plan, it is a good idea to ask around and get recommendations. You will then need to call the office and see if the doctor you have chosen will accept you as a new patient and what types of options they offer for patients who are self-pay. Some doctors offer discounts or will set up payment plans for their patients who do not have insurance, but you will need to ask so you aren't surprised when you get to your appointment.  2) Contact Your Local Health Department Not all health departments have doctors that can see patients for sick visits, but many do, so it is worth a call to see if yours does. If you don't know where your local health department is, you can check in your phone book. The CDC also has a tool to help you locate your state's health department, and many state websites also have listings of all of their local health departments.  3) Find a [facility] If your illness is not likely to be very severe or complicated, you may want to try a walk in clinic. These are popping up all over the country in pharmacies, drugstores, and shopping centers. They're usually staffed by nurse practitioners or physician assistants that have been trained to treat common illnesses and complaints. They're usually fairly quick and inexpensive. However, if you have serious medical issues or chronic medical problems, these are probably not your best option.  No Primary Care Doctor: - Call Health Connect at  [phone] - they can help you locate a primary care doctor that  accepts your insurance, provides certain services, etc. - Physician Referral Service- [phone]  Chronic Pain Problems: Organization         Address  Phone   Notes  [facility]  [phone] Patients need to be referred by their primary care doctor.   Medication Assistance: Organization         Address  Phone   Notes  [facility] [place] [place], [place] [address] [phone] --Must be a resident of [facility] -- Must have NO insurance coverage whatsoever (no Medicaid/ Medicare, etc.) -- The pt. MUST have a primary care doctor that directs their care regularly and follows them in the community   Med[facility]ist  [phone]   [employer]  [phone]    Agencies that provide inexpensive medical care: Organization         Address  Phone   Notes  [place]  [phone]   [facility]    [phone]   [facility] [place],  [phone]   [place] [address], [place] [phone]   Planned Parenthood    [phone]   [facility]    [phone]   [place] and [place], [place] Phone:  [phone], Fax:  [phone] Hours of Operation:  9 am - 6 pm, M-F.  Also accepts Medicaid/Medicare and  self-pay.  [facility] for [place], [address], [place] Phone: [phone], Fax: [phone]. Hours of Operation:  8:30 am - 5:30 pm, M-F.  Also accepts Medicaid and self-pay.  [facility] [place] [address], [place] Phone: [phone]   [place], [place], [place] [phone], Ext. [phone] Mondays & Thursdays: 7-9 AM.  First 15 patients are seen on a first come, first serve basis.    [place] Providers:  Organization         Address  Phone   Notes  [facility] [address], [place] [phone] Also accepts self-pay patients.  [facility] [address], [place]  [phone]   [place], [address], [place] [phone]   [facility] [address] [place] [phone]   Lucianne Lei [address], [place]   [phone] Only accepts [place] Access [place] patients after they have their name applied to their card.   Self-Pay (no insurance) in [facility]:  Organization         Address  Phone   Notes  Sickle Cell Patients, [facility] [place] [phone]   [facility] [place] [phone]   [facility] [place]  [place], [place],  [phone]   [facility]/Dr. Osei-Bonsu  [address] [place] or [address], [place] [phone] Phone number for both [place] and [place] locations is the same.  Urgent Medical and [facility] [address] [place] [phone]   [facility] [address] [place] or [address] [phone]   [facility] [address], [place] [phone], phone; [phone], fax Sees patients 1st and 3rd Saturday of every month.  Must not qualify for public or private insurance (i.e. Medicaid, Medicare, [place] Health Choice, Veterans' Benefits)  Household income should be no more than 200% of the poverty level The clinic cannot treat you if you are pregnant or think you are pregnant  Sexually transmitted diseases are not treated at the clinic.    Dental Care: Organization         Address  Phone  Notes  [facility] of [facility] [place] [phone] Accepts children up to age 15 who are enrolled in [place] or [place]; pregnant women with a Medicaid card; and children who have applied for Medicaid or [place] Health Choice, but were declined, whose parents can pay a reduced fee at time of service.  [facility] Department of [facility]  [address], [place] [phone] Accepts children up to age  1 who are enrolled in [place] or [place]; pregnant women with a Medicaid card; and children who have applied for Medicaid or [place] Health Choice, but were declined, whose parents can pay a reduced fee at time of service.  [place] Adult Dental Access PROGRAM  [place] [phone] Patients are seen by appointment only. Walk-ins are not accepted. [place] will see patients 91 years of age and older. Monday - Tuesday (8am-5pm) Most Wednesdays (8:30-5pm) $  30 per visit, cash only  [facility] [employer] PRO[facility]  [address], [facility] [phone] Patients are seen by appointment only. Walk-ins are not accepted. [place] will see patients 29 years of age and older. One Wednesday Evening (Monthly: Volunteer Based).  $30 per visit, cash only  [place]  [phone] for adults; Children under age 24, call Graduate Pediatric Dentistry at [phone]. Children aged 96-14, please call [phone] to request a pediatric application.  Dental services are provided in all areas of dental care including fillings, crowns and bridges, complete and partial dentures, implants, gum treatment, root canals, and extractions. Preventive care is also provided. Treatment is provided to both adults and children. Patients are selected via a lottery and there is often a waiting list.   [facility] [address], [place]  [phone] [email or web address]   [facility] [address] [place], [place] [phone], Ext. [phone] Second and Fourth Thursday of each month, opens at 6:30 AM; Clinic ends at 9 AM.  Patients are seen on a first-come first-served basis, and a limited number are seen during each clinic.   [facility]  [address] Hillard Danker [place], [place] [phone]   Eligibility Requirements You must have lived in [place], [place], or [place] counties for at least the last three  months.   You cannot be eligible for state or federal sponsored [employer], including [employer], [place], or [employer].   You generally cannot be eligible for healthcare insurance through your employer.    How to apply: Eligibility screenings are held every Tuesday and Wednesday afternoon from 1:00 pm until 4:00 pm. You do not need an appointment for the interview!  [facility] [address], [place], [place]   [place]  [place] Department  [place]  [phone]    Behavioral Health Resources in the Community: Intensive Outpatient Programs Organization         Address  Phone  Notes  [place]. [address], [place], [place] [phone]   [facility] Outpatient [address] [place], [place]   ADS: Alcohol & Drug Svcs [address], [place], [place]   [place] [address]  [place], [place] or [phone]   Substance Abuse Resources Organization         Address  Phone  Notes  Alcohol and Drug Services  [phone]   [place]  [phone]   [place]   Chinita Pester  [phone]   Residential & Outpatient Substance Abuse Program  [phone]   Psychological Services Organization         Address  Phone  Notes  [facility]  [place]  [phone]   [place] [address] [place] or [phone]    Mobile Crisis Teams Organization         Address  Phone  Notes  [facility], Mobile Crisis Care Unit  [phone]   [facility]  [address] [place], [place]   [place] [address] [place] [phone]    Self-Help/Support  Groups Organization         Address  Phone  Notes  [facility] of [place] - variety of support groups  [place] Call for more information  Narcotics Anonymous (NA), [facility] [address], [employer] [place]  2 meetings at this location   Special educational needs teacher         Address  Phone  Notes  [facility]idential Treatment [place],    [place]  [phone]   [facility]  [address] [place] [address], [place], [place]   [place], [place] [phone] Admissions: 8am-3pm M-F  Incentives Substance [place] [address]    [place], [place] [phone]   [facility] [address] [place], [place], [place]   [facility] [address]  [place], [place]   [facility] Programs - Intensive Outpatient [address], [place], [place]   [facility] [place].,  [place], [place] [phone] or [phone]   [facility] ([facility]S) [address], [place], [place] Accepts Medicaid  [facility] [place] [address]  [place] [phone] Substance Abuse/Addiction Treatment   [facility] Organization         Address  Phone  Notes  [facility]  [phone]   Domenic Schwab, PhD [address] Arlis Porta [place], [place]   [phone] or [phone]   [place], [place] [phone]   [facility] [address], [place], [place] [phone] Insurance/Medicaid/sponsorship through [facility] and Families [address], [address] [place]                                    [place], [place] [phone] [place] [address], [place] [phone]    Dr. Adele Schilder  [phone]   [facility] [place] Dept. 1) [address], [place] 2) [place] 3)  [place] [address], [place] [phone]  [phone]   [place] [phone] or [phone] (After Hours)

## 2013-10-30 NOTE — ED Notes (Signed)
Attempt to assist patient to restroom. Pt reports "getting hot all the sudden." Pt assisted back in bed with assistance using female urinal. As using female urinal pt insist to stand. Pt able to use female urinal independently without dizziness or unsteadiness.

## 2013-10-30 NOTE — ED Notes (Signed)
Pt asked about response to nausea. Pt reports decrease in nausea; as responding pt lifts herself up in bed spitting up saliva. Pt informed to keep emesis bag for assessment purposes.   PA notified of "hot spell" and nausea.

## 2013-10-30 NOTE — ED Notes (Signed)
EDPA at bedside/NS 500 cc fluid bolus infused-no further emesis noted/VSS-pt given gingerale for fluid challenge/encouraged to drink slowly-small sips

## 2013-10-30 NOTE — ED Notes (Signed)
PA at bedside. Pt encouraged to void when able.

## 2013-10-30 NOTE — ED Notes (Signed)
Patient c/o N/v/D and chills since yesterday AM. Patient states more than 20 times of both vomiting and diarrhea. Patient denies any blood in vomit or stool.

## 2013-10-31 NOTE — ED Provider Notes (Signed)
CSN: [id number]     Arrival date & time 10/30/13  1653 History   First MD Initiated Contact with Patient 10/30/13 1717     Chief Complaint  Patient presents with  . Emesis  . Diarrhea     (Consider location/radiation/quality/duration/timing/severity/associated sxs/prior Treatment) Patient is a 33 y.o. female presenting with vomiting and diarrhea.  Emesis Associated symptoms: chills and diarrhea   Associated symptoms: no abdominal pain and no headaches   Diarrhea Associated symptoms: chills and vomiting   Associated symptoms: no abdominal pain, no fever and no headaches    Patient is a 33 year old female who presents to the ER tonight complaining of nausea, vomiting, diarrhea since yesterday morning. Patient states yesterday she had diarrhea in excess of 13 times , and vomiting x5-7 times. Patient states she does report she is unable to keep anything down. Patient states she believes she may have "a virus". Patient states she was having chills, however did not check her temperature. She denies chest pain, shortness of breath, dizziness, weakness, dysuria, vaginal bleeding, discharge or pain. Patient states she did not notice any blood or tarry color to her stool, nor did she notice bloody or coffee ground emesis. Patient states she does not take birth control and she and her husband are trying to conceive and there is a chance she could be pregnant.     Past Medical History  Diagnosis Date  . Ovarian cyst   . Abnormal Pap smear   . PID (pelvic inflammatory disease)   . Gonorrhea   . Trichomonas   . Fibroids    Past Surgical History  Procedure Laterality Date  . Diagnostic laparoscopy  May 2012  . Laparoscopy N/A 06/26/2012    Procedure: LAPAROSCOPY OPERATIVE with CHROMOPERTUBATION;  Surgeon: Osborne Oman, MD;  Location: [facility];  Service: Gynecology;  Laterality: N/A;  . Salpingoophorectomy Left 06/26/2012    Procedure: SALPINGO OOPHORECTOMY;  Surgeon: Osborne Oman, MD;   Location: [facility];  Service: Gynecology;  Laterality: Left;   Family History  Problem Relation Age of Onset  . Anesthesia problems Neg Hx   . Cystic fibrosis Mother   . Hypertension Mother   . Diabetes Father    History  Substance Use Topics  . Smoking status: Current Every Day Smoker -- 1.00 packs/day for 10 years    Types: Cigarettes  . Smokeless tobacco: Never Used  . Alcohol Use: Yes     Comment: occ-  SOCIAL-    DEC LAST TIME.   OB History   Grav Para Term Preterm Abortions TAB SAB Ect Mult Living   3 0 0 0 3 0 3 0 0 0      Review of Systems  Constitutional: Positive for chills. Negative for fever.  HENT: Negative for trouble swallowing.   Eyes: Negative for visual disturbance.  Respiratory: Negative for cough, chest tightness and shortness of breath.   Cardiovascular: Negative for chest pain.  Gastrointestinal: Positive for nausea, vomiting and diarrhea. Negative for abdominal pain.  Genitourinary: Negative for dysuria, urgency, frequency, vaginal bleeding, vaginal discharge, vaginal pain and pelvic pain.  Musculoskeletal: Negative for neck stiffness.  Skin: Negative for rash.  Neurological: Negative for dizziness, syncope, weakness, numbness and headaches.  Psychiatric/Behavioral: Negative.       Allergies  Peanut-containing drug products; Spinach; Nsaids; and Tramadol  Home Medications   Prior to Admission medications   Medication Sig Start Date End Date Taking? Authorizing Provider  letrozole (FEMARA) 2.5 MG tablet Take 2 tablets (5 mg  total) by mouth daily. Take on days 3-7 of cycle as instructed 09/09/13  Yes Osborne Oman, MD  Multiple Vitamin (MULTIVITAMIN WITH MINERALS) TABS tablet Take 1 tablet by mouth daily.   Yes Historical Provider, MD  ondansetron (ZOFRAN) 4 MG tablet Take 1 tablet (4 mg total) by mouth every 6 (six) hours. 10/30/13   Carrie Mew, PA-C   BP 124/65  Pulse 77  Temp(Src) 98 F (36.7 C) (Oral)  Resp 16  SpO2 100% Physical Exam   Constitutional: She appears well-developed and well-nourished. No distress.  HENT:  Head: Normocephalic and atraumatic.  Eyes: Pupils are equal, round, and reactive to light. No scleral icterus.  Neck: Normal range of motion.  Cardiovascular: Normal rate, regular rhythm and normal heart sounds.   No murmur heard. Pulmonary/Chest: Effort normal and breath sounds normal. No respiratory distress.  Abdominal: Soft. Bowel sounds are normal. She exhibits no distension. There is tenderness in the right lower quadrant, suprapubic area and left lower quadrant.  Mild diffuse tenderness noted in patient's lower abdomen. Patient states she believes it may be from straining.  Skin: Skin is warm and dry. She is not diaphoretic. No pallor.    ED Course  Procedures (including critical care time) Labs Review Labs Reviewed  COMPREHENSIVE METABOLIC PANEL - Abnormal; Notable for the following:    Sodium 136 (*)    Total Protein 8.4 (*)    Total Bilirubin 0.2 (*)    GFR calc non Af Amer 84 (*)    All other components within normal limits  URINALYSIS, ROUTINE W REFLEX MICROSCOPIC - Abnormal; Notable for the following:    APPearance CLOUDY (*)    Hgb urine dipstick SMALL (*)    All other components within normal limits  URINE MICROSCOPIC-ADD ON - Abnormal; Notable for the following:    Squamous Epithelial / LPF FEW (*)    Bacteria, UA MANY (*)    All other components within normal limits  OVA AND PARASITE EXAMINATION  URINE CULTURE  CBC WITH DIFFERENTIAL  LIPASE, BLOOD  POC URINE PREG, ED    Imaging Review No results found.   EKG Interpretation None      MDM   Final diagnoses:  Viral gastroenteritis    33 year old female with a day and half of nausea, vomiting, diarrhea denying vaginal or urinary symptoms. Workup ruled out pregnancy, noted an abnormal urinalysis. Patient treated with IV fluids, Zofran and stated she felt much better after therapy. Patient vomited times one in the ED  prior to Zofran. Taking into account the patient's abnormal urinalysis and her present illness, it was decided since patient is having no symptoms of a UTI, antimicrobial therapy for same may exacerbate her GI symptoms at this time. Urine culture was therefore sent to follow up with patient when it returns.  Patient was agreeable to this plan. Patient was discharged with a diagnosis of viral gastroenteritis and given a prescription for Zofran by mouth. Patient was encouraged to followup with her PCP, she is also encouraged to call or return to the ER should her symptoms worsen or should she have any questions or concerns.    This patient was seen and discussed with Dr. Shirlyn Goltz, M.D.  Signed,  Dahlia Bailiff, PA-C 1:31 AM    Carrie Mew, PA-C 10/31/13 [phone]

## 2013-11-01 LAB — URINE CULTURE: Colony Count: 30000

## 2013-11-03 NOTE — ED Provider Notes (Signed)
Medical screening examination/treatment/procedure(s) were performed by non-physician practitioner and as supervising physician I was immediately available for consultation/collaboration.   EKG Interpretation None        Wandra Arthurs, MD 11/03/13 1050

## 2013-11-20 ENCOUNTER — Encounter: Payer: Self-pay | Admitting: General Practice

## 2013-11-23 ENCOUNTER — Encounter: Payer: Self-pay | Admitting: *Deleted

## 2013-11-23 ENCOUNTER — Telehealth: Payer: Self-pay | Admitting: *Deleted

## 2013-11-23 DIAGNOSIS — N946 Dysmenorrhea, unspecified: Secondary | ICD-10-CM

## 2013-11-23 DIAGNOSIS — R102 Pelvic and perineal pain unspecified side: Secondary | ICD-10-CM

## 2013-11-23 DIAGNOSIS — N979 Female infertility, unspecified: Secondary | ICD-10-CM

## 2013-11-23 NOTE — Telephone Encounter (Signed)
Called patient and she states that her pain flares up around her cycle and she has been having pain and has to take 4 tylenol at a time to even help and that only lasts a couple hours. Patient also states she would like her femara refilled as well. Told patient we are waiting to hear back from Dr Harolyn Rutherford and will call her back when we hear back from her. Patient verbalized understanding and had no other questions at this time

## 2013-11-23 NOTE — Telephone Encounter (Addendum)
Pt left message that her doctor is Anyan[facility] and is requesting pain med refill. She stated that her pelvic is inflamed.  **Per chart review, pt was planning to transfer care to Dr. Garwin Brothers. I called that office and confirmed that pt was seen on 7/9 by Dr. Garwin Brothers. The information I was given by their office personnel is that the pt is being seen for infertility treatment and is contemplating IUI or surgery. Message sent to Dr. Harolyn Rutherford and will await response.

## 2013-11-24 NOTE — Telephone Encounter (Signed)
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  However, if she has transferred her care to another GYN, she needs to ask that provider for further pain medication.  She cannot have two GYNs treating same complaints.  Verita Schneiders, MD, [place] Attending [place] for [employer], [place]

## 2013-11-25 DIAGNOSIS — Z87898 Personal history of other specified conditions: Secondary | ICD-10-CM | POA: Insufficient documentation

## 2013-11-25 DIAGNOSIS — F17200 Nicotine dependence, unspecified, uncomplicated: Secondary | ICD-10-CM | POA: Insufficient documentation

## 2013-11-25 MED ORDER — LETROZOLE 2.5 MG PO TABS: 7.5000 mg | ORAL_TABLET | Freq: Every day | ORAL | Status: DC

## 2013-11-25 MED ORDER — HYDROCODONE-ACETAMINOPHEN 5-325 MG PO TABS: 1.0000 | ORAL_TABLET | Freq: Four times a day (QID) | ORAL | Status: DC | PRN

## 2013-11-25 NOTE — Telephone Encounter (Addendum)
Pt left new message @ 0729 this morning stating that she has not heard back from our office regarding her pain med request and refill of Letrozole. I called pt back and explained that there is confusion as to whether she has transferred her care to another provider and it is important not to duplicate prescriptions. Pt was adamant that she has not transferred care. She stated, "Dr. Harolyn Rutherford has been my doctor for years. I have complete trust and faith in her. I only went to Dr. Garwin Brothers for a second opinion and because Dr. Harolyn Rutherford does not provide fertility treatment. I have no future appts with Dr. Garwin Brothers. She basically told me the same thing as Dr. Harolyn Rutherford. I also went to Dr. Kerin Perna once but he is not in my insurance network and his office is trying to work out a plan for me. I have the Letrozole that I need for this month's cycle but that was my last refill. Also sometimes I have a really bad cycle and need pain medicine and this month is bad".  I advised pt that I will speak with Dr. Harolyn Rutherford and then call her back. She voiced understanding. I notified Dr. Harolyn Rutherford of pt's statement and she agreed to refill Letrozole and prescribe pain medication.  0829  Pt notified that Dr. Harolyn Rutherford has provided her prescriptions and she may pick up any time today.  Pt was advised to take note that the dose for Letrozole has changed.  Pt voiced understanding.

## 2013-11-25 NOTE — Telephone Encounter (Signed)
Femara 7.5 mg prescribed. Will do this for three cycles.  No further Femara after this from our clinic. Vicodin prescribed for pain. Please call to inform patient of recommendations and advise her to pick up prescriptions.

## 2013-11-25 NOTE — Addendum Note (Signed)
Addended by: Verita Schneiders A on: 11/25/2013 08:20 AM   Modules accepted: Orders

## 2013-12-02 ENCOUNTER — Telehealth: Payer: Self-pay

## 2013-12-02 NOTE — Telephone Encounter (Signed)
Pt called wanting a refill on her nausea/vomiting medication.

## 2013-12-03 MED ORDER — ONDANSETRON HCL 4 MG PO TABS: 4.0000 mg | ORAL_TABLET | Freq: Four times a day (QID) | ORAL | Status: DC

## 2013-12-03 NOTE — Telephone Encounter (Addendum)
Pt left new message @ 1138 today stating that she called yesterday with medication refill request. A medication she was recently prescribed is making her sick. She is requesting refill of nausea medicine because she has run out. Please call back asap. I called pt and discussed her concern. She stated that she is on day #3 of her cycle, taking vicodin for pain as ordered and it makes her nauseous. She is out of Zofran and would like a refill. I consulted Dr. Roselie Awkward and received Rx refill order.  Pt was notified and voiced understanding.

## 2014-02-01 ENCOUNTER — Encounter (HOSPITAL_COMMUNITY): Payer: Self-pay | Admitting: Emergency Medicine

## 2014-02-09 ENCOUNTER — Encounter (HOSPITAL_COMMUNITY): Payer: Self-pay | Admitting: *Deleted

## 2014-02-09 ENCOUNTER — Inpatient Hospital Stay (HOSPITAL_COMMUNITY)
Admission: AD | Admit: 2014-02-09 | Discharge: 2014-02-09 | Disposition: A | Discharge status: Home / Self Care | Payer: Medicaid Other | Source: Ambulatory Visit | Attending: Obstetrics & Gynecology | Admitting: Obstetrics & Gynecology

## 2014-02-09 ENCOUNTER — Inpatient Hospital Stay (HOSPITAL_COMMUNITY): Payer: Medicaid Other

## 2014-02-09 DIAGNOSIS — F17210 Nicotine dependence, cigarettes, uncomplicated: Secondary | ICD-10-CM | POA: Diagnosis not present

## 2014-02-09 DIAGNOSIS — R102 Pelvic and perineal pain: Secondary | ICD-10-CM

## 2014-02-09 DIAGNOSIS — N949 Unspecified condition associated with female genital organs and menstrual cycle: Secondary | ICD-10-CM | POA: Diagnosis not present

## 2014-02-09 DIAGNOSIS — B9689 Other specified bacterial agents as the cause of diseases classified elsewhere: Secondary | ICD-10-CM | POA: Insufficient documentation

## 2014-02-09 DIAGNOSIS — G8929 Other chronic pain: Secondary | ICD-10-CM | POA: Diagnosis present

## 2014-02-09 DIAGNOSIS — N760 Acute vaginitis: Secondary | ICD-10-CM | POA: Insufficient documentation

## 2014-02-09 LAB — URINALYSIS, ROUTINE W REFLEX MICROSCOPIC
Bilirubin Urine: NEGATIVE
Glucose, UA: NEGATIVE mg/dL
KETONES UR: NEGATIVE mg/dL
LEUKOCYTES UA: NEGATIVE
NITRITE: NEGATIVE
PH: 5.5 (ref 5.0–8.0)
Protein, ur: NEGATIVE mg/dL
Specific Gravity, Urine: 1.02 (ref 1.005–1.030)
UROBILINOGEN UA: 0.2 mg/dL (ref 0.0–1.0)

## 2014-02-09 LAB — CBC
HCT: 40.4 % (ref 36.0–46.0)
HEMOGLOBIN: 13.6 g/dL (ref 12.0–15.0)
MCH: 28.2 pg (ref 26.0–34.0)
MCHC: 33.7 g/dL (ref 30.0–36.0)
MCV: 83.6 fL (ref 78.0–100.0)
Platelets: 313 10*3/uL (ref 150–400)
RBC: 4.83 MIL/uL (ref 3.87–5.11)
RDW: 13.4 % (ref 11.5–15.5)
WBC: 5.6 10*3/uL (ref 4.0–10.5)

## 2014-02-09 LAB — WET PREP, GENITAL
Trich, Wet Prep: NONE SEEN
Yeast Wet Prep HPF POC: NONE SEEN

## 2014-02-09 LAB — HIV ANTIBODY (ROUTINE TESTING W REFLEX): HIV 1&2 Ab, 4th Generation: NONREACTIVE

## 2014-02-09 LAB — URINE MICROSCOPIC-ADD ON

## 2014-02-09 LAB — POCT PREGNANCY, URINE: PREG TEST UR: NEGATIVE

## 2014-02-09 MED ORDER — METRONIDAZOLE 0.75 % VA GEL: 1.0000 | Freq: Two times a day (BID) | VAGINAL | Status: DC

## 2014-02-09 MED ORDER — ONDANSETRON 8 MG PO TBDP
8.0000 mg | ORAL_TABLET | Freq: Once | ORAL | Status: AC
  Administered 2014-02-09: 8 mg via ORAL
  Filled 2014-02-09: qty 1

## 2014-02-09 MED ORDER — HYDROCODONE-ACETAMINOPHEN 5-325 MG PO TABS: 1.0000 | ORAL_TABLET | Freq: Four times a day (QID) | ORAL | Status: DC | PRN

## 2014-02-09 MED ORDER — HYDROCODONE-ACETAMINOPHEN 5-325 MG PO TABS
2.0000 | ORAL_TABLET | Freq: Once | ORAL | Status: AC
Start: 2014-02-09 — End: 2014-02-09
  Administered 2014-02-09: 2 via ORAL
  Filled 2014-02-09: qty 2

## 2014-02-09 MED ORDER — METRONIDAZOLE 500 MG PO TABS: 500.0000 mg | ORAL_TABLET | Freq: Two times a day (BID) | ORAL | Status: DC

## 2014-02-09 NOTE — MAU Note (Signed)
Pt presents to MAU with complaints of pelvic pain since last night. Reports a history of PID.

## 2014-02-09 NOTE — MAU Provider Note (Signed)
Chief Complaint: Pelvic Pain   First Provider Initiated Contact with Patient 02/09/14 0908     SUBJECTIVE HPI: [name] [name] is a 33 y.o. [id number] female who presents with exacerbation of chronic pelvic pain and vaginal discharge w/ odor. Concerned that she may have a new ovarian cyst. Gets BV ~ 4+ x per year. Hx PID and LSO.   Describes pain as constant w/ occasional sharp pains, 9/10 on pain scale, same as pain that she has had for many years, but worse than usual.   Usually can control chronic pain w/ comfort measures and occasional  Vicodin, but ran out.   She states she is in a mutually monogamous relationship x 8 years.  Past Medical History  Diagnosis Date  . Ovarian cyst   . Abnormal Pap smear   . PID (pelvic inflammatory disease)   . Gonorrhea   . Trichomonas   . Fibroids    OB History  Gravida Para Term Preterm AB SAB TAB Ectopic Multiple Living  3 0 0 0 3 3 0 0 0 0     # Outcome Date GA Lbr Len/2nd Weight Sex Delivery Anes PTL Lv  3 SAB           2 SAB           1 SAB              Past Surgical History  Procedure Laterality Date  . Diagnostic laparoscopy  May 2012  . Laparoscopy N/A 06/26/2012    Procedure: LAPAROSCOPY OPERATIVE with CHROMOPERTUBATION;  Surgeon: Osborne Oman, MD;  Location: [facility];  Service: Gynecology;  Laterality: N/A;  . Salpingoophorectomy Left 06/26/2012    Procedure: SALPINGO OOPHORECTOMY;  Surgeon: Osborne Oman, MD;  Location: [facility];  Service: Gynecology;  Laterality: Left;   History   Social History  . Marital Status: Married    Spouse Name: N/A    Number of Children: N/A  . Years of Education: N/A   Occupational History  . Not on file.   Social History Main Topics  . Smoking status: Current Every Day Smoker -- 1.00 packs/day for 10 years    Types: Cigarettes  . Smokeless tobacco: Never Used  . Alcohol Use: Yes     Comment: occ-  SOCIAL-    DEC LAST TIME.  . Drug Use: Yes    Special: Marijuana     Comment: daily  use of marijuana  . Sexual Activity: Yes    Birth Control/ Protection: None     Comment: Last Depo injection 09/01/2012   Other Topics Concern  . Not on file   Social History Narrative   No current facility-administered medications on file prior to encounter.   Current Outpatient Prescriptions on File Prior to Encounter  Medication Sig Dispense Refill  . Multiple Vitamin (MULTIVITAMIN WITH MINERALS) TABS tablet Take 1 tablet by mouth daily.    Marland Kitchen HYDROcodone-acetaminophen (NORCO/VICODIN) 5-325 MG per tablet Take 1 tablet by mouth every 6 (six) hours as needed. (Patient not taking: Reported on 02/09/2014) 60 tablet 0  . letrozole (FEMARA) 2.5 MG tablet Take 3 tablets (7.5 mg total) by mouth daily. Take on days 3-7 of cycle as instructed (Patient not taking: Reported on 02/09/2014) 15 tablet 3  . ondansetron (ZOFRAN) 4 MG tablet Take 1 tablet (4 mg total) by mouth every 6 (six) hours. (Patient not taking: Reported on 02/09/2014) 20 tablet 1   Allergies  Allergen Reactions  . Peanut-Containing Drug Products Anaphylaxis  .  Spinach Anaphylaxis    Canned spinach  . Nsaids Hives, Itching and Nausea And Vomiting    All NSAIDs cause this reaction. Benadryl does not help. Ibuprofen also causes personality changes.   . Tramadol Itching and Nausea And Vomiting    Able to take Vicodin, Percocet    ROS: Pertinent positive items in HPI. Neg for fever, chills, urinary complaints, GI complaints, flank pain.   OBJECTIVE Blood pressure 137/76, pulse 80, temperature 98.3 F (36.8 C), resp. rate 18, height 5' (1.524 m), weight 63.504 kg (140 lb), last menstrual period 01/02/2014. GENERAL: Well-developed, well-nourished female in mild-mod distress.  HEENT: Normocephalic HEART: normal rate RESP: normal effort ABDOMEN: Soft, mod SP tenderness. Pos BS x 4. No CVAT. EXTREMITIES: Nontender, no edema NEURO: Alert and oriented SPECULUM EXAM: NEFG, small amount if thin, white, malodorous discharge, no  blood noted, cervix clean BIMANUAL: cervix closed; uterus normal size, pos bilat adnexal tenderness. ~ 3 cm mass slightly right of ML, anterior (?fibroid). Mild CMT.   LAB RESULTS Results for orders placed or performed during the hospital encounter of 02/09/14 (from the past 24 hour(s))  Urinalysis, Routine w reflex microscopic     Status: Abnormal   Collection Time: 02/09/14  8:00 AM  Result Value Ref Range   Color, Urine YELLOW YELLOW   APPearance CLEAR CLEAR   Specific Gravity, Urine 1.020 1.005 - 1.030   pH 5.5 5.0 - 8.0   Glucose, UA NEGATIVE NEGATIVE mg/dL   Hgb urine dipstick TRACE (A) NEGATIVE   Bilirubin Urine NEGATIVE NEGATIVE   Ketones, ur NEGATIVE NEGATIVE mg/dL   Protein, ur NEGATIVE NEGATIVE mg/dL   Urobilinogen, UA 0.2 0.0 - 1.0 mg/dL   Nitrite NEGATIVE NEGATIVE   Leukocytes, UA NEGATIVE NEGATIVE  Urine microscopic-add on     Status: None   Collection Time: 02/09/14  8:00 AM  Result Value Ref Range   Squamous Epithelial / LPF RARE RARE   WBC, UA 0-2 <3 WBC/hpf   Bacteria, UA RARE RARE  Pregnancy, urine POC     Status: None   Collection Time: 02/09/14  8:08 AM  Result Value Ref Range   Preg Test, Ur NEGATIVE NEGATIVE  CBC     Status: None   Collection Time: 02/09/14  8:21 AM  Result Value Ref Range   WBC 5.6 4.0 - 10.5 K/uL   RBC 4.83 3.87 - 5.11 MIL/uL   Hemoglobin 13.6 12.0 - 15.0 g/dL   HCT 40.4 36.0 - 46.0 %   MCV 83.6 78.0 - 100.0 fL   MCH 28.2 26.0 - 34.0 pg   MCHC 33.7 30.0 - 36.0 g/dL   RDW 13.4 11.5 - 15.5 %   Platelets 313 150 - 400 K/uL  Wet prep, genital     Status: Abnormal   Collection Time: 02/09/14  9:30 AM  Result Value Ref Range   Yeast Wet Prep HPF POC NONE SEEN NONE SEEN   Trich, Wet Prep NONE SEEN NONE SEEN   Clue Cells Wet Prep HPF POC FEW (A) NONE SEEN   WBC, Wet Prep HPF POC FEW (A) NONE SEEN    IMAGING US Transvaginal Non-ob  02/09/2014   CLINICAL DATA:  Pelvic pain, status post left salpingo-oophorectomy, history of uterine  fibroids, right ovarian cysts, and PID  EXAM: TRANSABDOMINAL AND TRANSVAGINAL ULTRASOUND OF PELVIS  TECHNIQUE: Both transabdominal and transvaginal ultrasound examinations of the pelvis were performed. Transabdominal technique was performed for global imaging of the pelvis including uterus, ovaries, adnexal regions, and pelvic  cul-de-sac. It was necessary to proceed with endovaginal exam following the transabdominal exam to visualize the endometrium.  COMPARISON:  08/31/2013  FINDINGS: Uterus  Measurements: 9.9 x 5.4 x 5.9 cm. 2.6 x 2.5 x 2.7 cm subserosal fibroid in the right uterine body.  Endometrium  Thickness: 13 mm.  No focal abnormality visualized.  Right ovary  Measurements: 5.1 x 2.7 x 3.0 cm. Normal appearance/no adnexal mass.  Left ovary  Surgically absent.  Other findings  No free fluid.  IMPRESSION: 2.7 cm subserosal fibroid in the right uterine body.  Left ovary is surgically absent.   Electronically Signed   By: Julian Hy M.D.   On: 02/09/2014 09:22   US Pelvis Complete  02/09/2014   CLINICAL DATA:  Pelvic pain, status post left salpingo-oophorectomy, history of uterine fibroids, right ovarian cysts, and PID  EXAM: TRANSABDOMINAL AND TRANSVAGINAL ULTRASOUND OF PELVIS  TECHNIQUE: Both transabdominal and transvaginal ultrasound examinations of the pelvis were performed. Transabdominal technique was performed for global imaging of the pelvis including uterus, ovaries, adnexal regions, and pelvic cul-de-sac. It was necessary to proceed with endovaginal exam following the transabdominal exam to visualize the endometrium.  COMPARISON:  08/31/2013  FINDINGS: Uterus  Measurements: 9.9 x 5.4 x 5.9 cm. 2.6 x 2.5 x 2.7 cm subserosal fibroid in the right uterine body.  Endometrium  Thickness: 13 mm.  No focal abnormality visualized.  Right ovary  Measurements: 5.1 x 2.7 x 3.0 cm. Normal appearance/no adnexal mass.  Left ovary  Surgically absent.  Other findings  No free fluid.  IMPRESSION: 2.7 cm  subserosal fibroid in the right uterine body.  Left ovary is surgically absent.   Electronically Signed   By: Julian Hy M.D.   On: 02/09/2014 09:22    MAU COURSE Pain 3/10 after vicodin.  ASSESSMENT 1. Chronic pelvic pain in female   2. Pelvic pain in female   3. BV (bacterial vaginosis)     PLAN Discharge home in stable condition/      Follow-up Information    Follow up with [facility].   Specialty:  Obstetrics and Gynecology   Why:  As needed if symptoms worsen   Contact information:   [place] [phone]      Follow up with [facility].   Why:  As needed in emergencies   Contact information:   [address] [id number] [place] [phone]       Medication List    STOP taking these medications        letrozole 2.5 MG tablet  Commonly known as:  FEMARA     ondansetron 4 MG tablet  Commonly known as:  ZOFRAN      TAKE these medications        acetaminophen 500 MG tablet  Commonly known as:  TYLENOL  Take 1,000 mg by mouth every 6 (six) hours as needed.     HYDROcodone-acetaminophen 5-325 MG per tablet  Commonly known as:  NORCO/VICODIN  Take 1 tablet by mouth every 6 (six) hours as needed for severe pain.     metroNIDAZOLE 0.75 % vaginal gel  Commonly known as:  METROGEL VAGINAL  Place 1 Applicatorful vaginally 2 (two) times daily. Start after 1 week course of Flagyl. Use twice a week for 6 months.     metroNIDAZOLE 500 MG tablet  Commonly known as:  FLAGYL  Take 1 tablet (500 mg total) by  mouth 2 (two) times daily.     multivitamin with minerals Tabs tablet  Take 1 tablet by mouth daily.         [place], CNM 02/09/2014  10:26 AM

## 2014-02-09 NOTE — Discharge Instructions (Signed)
Pelvic Pain Female pelvic pain can be caused by many different things and start from a variety of places. Pelvic pain refers to pain that is located in the lower half of the abdomen and between your hips. The pain may occur over a short period of time (acute) or may be reoccurring (chronic). The cause of pelvic pain may be related to disorders affecting the female reproductive organs (gynecologic), but it may also be related to the bladder, kidney stones, an intestinal complication, or muscle or skeletal problems. Getting help right away for pelvic pain is important, especially if there has been severe, sharp, or a sudden onset of unusual pain. It is also important to get help right away because some types of pelvic pain can be life threatening.  CAUSES  Below are only some of the causes of pelvic pain. The causes of pelvic pain can be in one of several categories.   Gynecologic.  Pelvic inflammatory disease.  Sexually transmitted infection.  Ovarian cyst or a twisted ovarian ligament (ovarian torsion).  Uterine lining that grows outside the uterus (endometriosis).  Fibroids, cysts, or tumors.  Ovulation.  Pregnancy.  Pregnancy that occurs outside the uterus (ectopic pregnancy).  Miscarriage.  Labor.  Abruption of the placenta or ruptured uterus.  Infection.  Uterine infection (endometritis).  Bladder infection.  Diverticulitis.  Miscarriage related to a uterine infection (septic abortion).  Bladder.  Inflammation of the bladder (cystitis).  Kidney stone(s).  Gastrointestinal.  Constipation.  Diverticulitis.  Neurologic.  Trauma.  Feeling pelvic pain because of mental or emotional causes (psychosomatic).  Cancers of the bowel or pelvis. EVALUATION  Your caregiver will want to take a careful history of your concerns. This includes recent changes in your health, a careful gynecologic history of your periods (menses), and a sexual history. Obtaining your family  history and medical history is also important. Your caregiver may suggest a pelvic exam. A pelvic exam will help identify the location and severity of the pain. It also helps in the evaluation of which organ system may be involved. In order to identify the cause of the pelvic pain and be properly treated, your caregiver may order tests. These tests may include:   A pregnancy test.  Pelvic ultrasonography.  An X-ray exam of the abdomen.  A urinalysis or evaluation of vaginal discharge.  Blood tests. HOME CARE INSTRUCTIONS   Only take over-the-counter or prescription medicines for pain, discomfort, or fever as directed by your caregiver.   Rest as directed by your caregiver.   Eat a balanced diet.   Drink enough fluids to make your urine clear or pale yellow, or as directed.   Avoid sexual intercourse if it causes pain.   Apply warm or cold compresses to the lower abdomen depending on which one helps the pain.   Avoid stressful situations.   Keep a journal of your pelvic pain. Write down when it started, where the pain is located, and if there are things that seem to be associated with the pain, such as food or your menstrual cycle.  Follow up with your caregiver as directed.  SEEK MEDICAL CARE IF:  Your medicine does not help your pain.  You have abnormal vaginal discharge. SEEK IMMEDIATE MEDICAL CARE IF:   You have heavy bleeding from the vagina.   Your pelvic pain increases.   You feel light-headed or faint.   You have chills.   You have pain with urination or blood in your urine.   You have uncontrolled diarrhea   or vomiting.   You have a fever or persistent symptoms for more than 3 days.  You have a fever and your symptoms suddenly get worse.   You are being physically or sexually abused.  MAKE SURE YOU:  Understand these instructions.  Will watch your condition.  Will get help if you are not doing well or get worse. Document Released:  02/14/2004 Document Revised: 08/03/2013 Document Reviewed: 07/09/2011 Exit[employer] Patient Information 2015 [employer]. This information is not intended to replace advice given to you by your health care provider. Make sure you discuss any questions you have with your health care provider.  

## 2014-02-10 LAB — GC/CHLAMYDIA PROBE AMP
CT Probe RNA: NEGATIVE
GC Probe RNA: NEGATIVE

## 2014-02-15 ENCOUNTER — Encounter: Payer: Self-pay | Admitting: Advanced Practice Midwife

## 2014-02-15 DIAGNOSIS — D259 Leiomyoma of uterus, unspecified: Secondary | ICD-10-CM | POA: Insufficient documentation

## 2014-04-02 ENCOUNTER — Emergency Department (HOSPITAL_COMMUNITY): Payer: No Typology Code available for payment source

## 2014-04-02 ENCOUNTER — Emergency Department (HOSPITAL_COMMUNITY)
Admission: EM | Admit: 2014-04-02 | Discharge: 2014-04-02 | Disposition: A | Discharge status: Home / Self Care | Payer: No Typology Code available for payment source | Attending: Emergency Medicine | Admitting: Emergency Medicine

## 2014-04-02 ENCOUNTER — Encounter (HOSPITAL_COMMUNITY): Payer: Self-pay | Admitting: Emergency Medicine

## 2014-04-02 DIAGNOSIS — O210 Mild hyperemesis gravidarum: Secondary | ICD-10-CM | POA: Insufficient documentation

## 2014-04-02 DIAGNOSIS — Z8742 Personal history of other diseases of the female genital tract: Secondary | ICD-10-CM | POA: Insufficient documentation

## 2014-04-02 DIAGNOSIS — Z3A01 Less than 8 weeks gestation of pregnancy: Secondary | ICD-10-CM | POA: Insufficient documentation

## 2014-04-02 DIAGNOSIS — Z720 Tobacco use: Secondary | ICD-10-CM | POA: Diagnosis not present

## 2014-04-02 DIAGNOSIS — Z8619 Personal history of other infectious and parasitic diseases: Secondary | ICD-10-CM | POA: Diagnosis not present

## 2014-04-02 DIAGNOSIS — Z3491 Encounter for supervision of normal pregnancy, unspecified, first trimester: Secondary | ICD-10-CM

## 2014-04-02 DIAGNOSIS — Z792 Long term (current) use of antibiotics: Secondary | ICD-10-CM | POA: Diagnosis not present

## 2014-04-02 DIAGNOSIS — O26899 Other specified pregnancy related conditions, unspecified trimester: Secondary | ICD-10-CM

## 2014-04-02 DIAGNOSIS — O219 Vomiting of pregnancy, unspecified: Secondary | ICD-10-CM

## 2014-04-02 DIAGNOSIS — R109 Unspecified abdominal pain: Secondary | ICD-10-CM

## 2014-04-02 LAB — COMPREHENSIVE METABOLIC PANEL
ALK PHOS: 48 U/L (ref 39–117)
ALT: 38 U/L — ABNORMAL HIGH (ref 0–35)
AST: 26 U/L (ref 0–37)
Albumin: 4.1 g/dL (ref 3.5–5.2)
Anion gap: 6 (ref 5–15)
BUN: 7 mg/dL (ref 6–23)
CO2: 22 mmol/L (ref 19–32)
Calcium: 9.5 mg/dL (ref 8.4–10.5)
Chloride: 103 mEq/L (ref 96–112)
Creatinine, Ser: 0.66 mg/dL (ref 0.50–1.10)
GFR calc Af Amer: >90 mL/min (ref >90)
GFR calc non Af Amer: >90 mL/min (ref >90)
Glucose, Bld: 93 mg/dL (ref 70–99)
POTASSIUM: 3.6 mmol/L (ref 3.5–5.1)
Sodium: 131 mmol/L — ABNORMAL LOW (ref 135–145)
TOTAL PROTEIN: 7.5 g/dL (ref 6.0–8.3)
Total Bilirubin: 0.4 mg/dL (ref 0.3–1.2)

## 2014-04-02 LAB — CBC WITH DIFFERENTIAL/PLATELET
Basophils Absolute: 0 10*3/uL (ref 0.0–0.1)
Basophils Relative: 0 % (ref 0–1)
Eosinophils Absolute: 0.1 10*3/uL (ref 0.0–0.7)
Eosinophils Relative: 2 % (ref 0–5)
HCT: 37.5 % (ref 36.0–46.0)
HEMOGLOBIN: 12.2 g/dL (ref 12.0–15.0)
Lymphocytes Relative: 30 % (ref 12–46)
Lymphs Abs: 1.4 10*3/uL (ref 0.7–4.0)
MCH: 27.1 pg (ref 26.0–34.0)
MCHC: 32.5 g/dL (ref 30.0–36.0)
MCV: 83.1 fL (ref 78.0–100.0)
MONOS PCT: 9 % (ref 3–12)
Monocytes Absolute: 0.4 10*3/uL (ref 0.1–1.0)
Neutro Abs: 2.8 10*3/uL (ref 1.7–7.7)
Neutrophils Relative %: 59 % (ref 43–77)
PLATELETS: 349 10*3/uL (ref 150–400)
RBC: 4.51 MIL/uL (ref 3.87–5.11)
RDW: 13.4 % (ref 11.5–15.5)
WBC: 4.7 10*3/uL (ref 4.0–10.5)

## 2014-04-02 LAB — URINALYSIS, ROUTINE W REFLEX MICROSCOPIC
Bilirubin Urine: NEGATIVE
Glucose, UA: NEGATIVE mg/dL
Hgb urine dipstick: NEGATIVE
Ketones, ur: NEGATIVE mg/dL
LEUKOCYTES UA: NEGATIVE
NITRITE: NEGATIVE
Protein, ur: NEGATIVE mg/dL
SPECIFIC GRAVITY, URINE: 1.026 (ref 1.005–1.030)
UROBILINOGEN UA: 0.2 mg/dL (ref 0.0–1.0)
pH: 7.5 (ref 5.0–8.0)

## 2014-04-02 LAB — LIPASE, BLOOD: LIPASE: 21 U/L (ref 11–59)

## 2014-04-02 LAB — HCG, QUANTITATIVE, PREGNANCY: HCG, BETA CHAIN, QUANT, S: 42228 m[IU]/mL — AB (ref <5)

## 2014-04-02 LAB — POC URINE PREG, ED: PREG TEST UR: POSITIVE — AB

## 2014-04-02 LAB — URINE MICROSCOPIC-ADD ON

## 2014-04-02 MED ORDER — FENTANYL CITRATE 0.05 MG/ML IJ SOLN
100.0000 ug | Freq: Once | INTRAMUSCULAR | Status: DC
Start: 2014-04-02 — End: 2014-04-02
  Filled 2014-04-02: qty 2

## 2014-04-02 MED ORDER — SODIUM CHLORIDE 0.9 % IV BOLUS (SEPSIS)
1000.0000 mL | Freq: Once | INTRAVENOUS | Status: AC
  Administered 2014-04-02: 1000 mL via INTRAVENOUS

## 2014-04-02 MED ORDER — ONDANSETRON 4 MG PO TBDP: ORAL_TABLET | ORAL | Status: DC

## 2014-04-02 MED ORDER — ONDANSETRON HCL 4 MG/2ML IJ SOLN
4.0000 mg | Freq: Once | INTRAMUSCULAR | Status: AC
  Administered 2014-04-02: 4 mg via INTRAVENOUS
  Filled 2014-04-02: qty 2

## 2014-04-02 MED ORDER — ONDANSETRON 4 MG PO TBDP
4.0000 mg | ORAL_TABLET | Freq: Once | ORAL | Status: AC
  Administered 2014-04-02: 4 mg via ORAL
  Filled 2014-04-02: qty 1

## 2014-04-02 NOTE — ED Notes (Addendum)
Pt states that she has been having gen abd pain, NV since Wednesday.  When asked about LMP, pt states it was last in October and has not taken a preg test.  When asked if there was any chance she could be pregnant, pt states "probably, probably not".  Pt states that she needs a blood test because she has had hx of false positives and false negatives.

## 2014-04-02 NOTE — ED Notes (Signed)
MD at bedside. 

## 2014-04-02 NOTE — ED Provider Notes (Signed)
CSN: [id number]     Arrival date & time 04/02/14  0804 History   First MD Initiated Contact with Patient 04/02/14 0831     Chief Complaint  Patient presents with  . Abdominal Pain  . Nausea  . Emesis     (Consider location/radiation/quality/duration/timing/severity/associated sxs/prior Treatment) HPI  34 year old female with 48 hours of nausea and vomiting. Has had 3 loose stools this morning. Has right lower quadrant abdominal pain for the past 1 week that she states is typical for her and has been attributed to her uterine fibroids and right ovarian cyst. This pain is not different than it usually is. Not more severe. No sudden onset of pain. When asked more about her abdominal pain she states she is not worried about this, this is typical for her. No vaginal symptoms. She's mostly complaining of the nausea, vomiting, and a headache that is been gone for 2 days as well. She states it's like a headachy get when you have a cold. Fevers but has had chills. Had dysuria one time but since been urinating normally. Has not had a menstrual period since October. Patient states they always have to check if she's pregnant "in the blood" because it doesn't show up on the urine test.  Past Medical History  Diagnosis Date  . Ovarian cyst   . Abnormal Pap smear   . PID (pelvic inflammatory disease)   . Gonorrhea   . Trichomonas   . Fibroids   . Uterine fibroid 02/15/2014   Past Surgical History  Procedure Laterality Date  . Diagnostic laparoscopy  May 2012  . Laparoscopy N/A 06/26/2012    Procedure: LAPAROSCOPY OPERATIVE with CHROMOPERTUBATION;  Surgeon: Osborne Oman, MD;  Location: [facility];  Service: Gynecology;  Laterality: N/A;  . Salpingoophorectomy Left 06/26/2012    Procedure: SALPINGO OOPHORECTOMY;  Surgeon: Osborne Oman, MD;  Location: [facility];  Service: Gynecology;  Laterality: Left;   Family History  Problem Relation Age of Onset  . Anesthesia problems Neg Hx   . Cystic fibrosis  Mother   . Hypertension Mother   . Diabetes Father    History  Substance Use Topics  . Smoking status: Current Every Day Smoker -- 1.00 packs/day for 10 years    Types: Cigarettes  . Smokeless tobacco: Never Used  . Alcohol Use: Yes     Comment: occ-  SOCIAL-    DEC LAST TIME.   OB History    Gravida Para Term Preterm AB TAB SAB Ectopic Multiple Living   4 0 0 0 3 0 3 0 0 0      Review of Systems  Gastrointestinal: Positive for nausea, vomiting, abdominal pain and diarrhea.  Genitourinary: Positive for menstrual problem. Negative for vaginal bleeding and vaginal discharge.  Neurological: Positive for headaches. Negative for weakness and numbness.  All other systems reviewed and are negative.     Allergies  Peanut-containing drug products; Spinach; Nsaids; and Tramadol  Home Medications   Prior to Admission medications   Medication Sig Start Date End Date Taking? Authorizing Provider  acetaminophen (TYLENOL) 500 MG tablet Take 1,000 mg by mouth every 6 (six) hours as needed.    Historical Provider, MD  HYDROcodone-acetaminophen (NORCO/VICODIN) 5-325 MG per tablet Take 1 tablet by mouth every 6 (six) hours as needed for severe pain. 02/09/14   Manya Silvas, CNM  metroNIDAZOLE (FLAGYL) 500 MG tablet Take 1 tablet (500 mg total) by mouth 2 (two) times daily. 02/09/14   Manya Silvas, CNM  metroNIDAZOLE (  METROGEL VAGINAL) 0.75 % vaginal gel Place 1 Applicatorful vaginally 2 (two) times daily. Start after 1 week course of Flagyl. Use twice a week for 6 months. 02/09/14   Manya Silvas, CNM  Multiple Vitamin (MULTIVITAMIN WITH MINERALS) TABS tablet Take 1 tablet by mouth daily.    Historical Provider, MD   BP 123/65 mmHg  Pulse 81  Temp(Src) 98.4 F (36.9 C) (Oral)  SpO2 100%  LMP 12/31/2013 Physical Exam  Constitutional: She is oriented to person, place, and time. She appears well-developed and well-nourished.  HENT:  Head: Normocephalic and atraumatic.  Right Ear:  External ear normal.  Left Ear: External ear normal.  Nose: Nose normal.  Eyes: EOM are normal. Pupils are equal, round, and reactive to light. Right eye exhibits no discharge. Left eye exhibits no discharge.  Cardiovascular: Normal rate, regular rhythm and normal heart sounds.   Pulmonary/Chest: Effort normal and breath sounds normal.  Abdominal: Soft. There is tenderness (moderate) in the right lower quadrant. There is no rigidity and no guarding.  Neurological: She is alert and oriented to person, place, and time.  CN 2-12 grossly intact. 5/5 strength in all 4 extremities  Skin: Skin is warm and dry.  Vitals reviewed.   ED Course  Procedures (including critical care time) Labs Review Labs Reviewed  COMPREHENSIVE METABOLIC PANEL - Abnormal; Notable for the following:    Sodium 131 (*)    ALT 38 (*)    All other components within normal limits  URINALYSIS, ROUTINE W REFLEX MICROSCOPIC - Abnormal; Notable for the following:    APPearance TURBID (*)    All other components within normal limits  HCG, QUANTITATIVE, PREGNANCY - Abnormal; Notable for the following:    hCG, Beta Chain, Quant, S 42228 (*)    All other components within normal limits  URINE MICROSCOPIC-ADD ON - Abnormal; Notable for the following:    Squamous Epithelial / LPF MANY (*)    All other components within normal limits  POC URINE PREG, ED - Abnormal; Notable for the following:    Preg Test, Ur POSITIVE (*)    All other components within normal limits  CBC WITH DIFFERENTIAL  LIPASE, BLOOD    Imaging Review US Ob Comp Less 14 Wks  04/02/2014   CLINICAL DATA:  Right-sided pain.  EXAM: OBSTETRIC <14 WK [place] AND TRANSVAGINAL OB US  TECHNIQUE: Both transabdominal and transvaginal ultrasound examinations were performed for complete evaluation of the gestation as well as the maternal uterus, adnexal regions, and pelvic cul-de-sac. Transvaginal technique was performed to assess early pregnancy.  COMPARISON:  02/09/2014   FINDINGS: Intrauterine gestational sac: Present  Yolk sac:  Present  Embryo:  Present  Cardiac Activity: Present  Heart Rate:  110 bpm  CRL:   7  mm   6 w 4d                  [place] EDC: 11/22/2014  Maternal uterus/adnexae: Right-sided uterine fibroid measures 3.9 cm. The left ovary is surgically absent. The right ovary demonstrates a 2.7 x 2.0 x 2.4 cm hypoechoic complex lesion within.  Small amount of fluid surrounds the right ovary.  IMPRESSION: 1. Intrauterine pregnancy of approximately 6 weeks 4 days with fetal heart rate of 110 beats beats per min. 2. Probable right ovarian corpus luteal cyst. Ovarian fluid may relate to recent cyst rupture. 3. Right-sided uterine fibroid.   Electronically Signed   By: Abigail Miyamoto M.D.   On: 04/02/2014 12:18   US Ob  Transvaginal  04/02/2014   CLINICAL DATA:  Right-sided pain.  EXAM: OBSTETRIC <14 WK [place] AND TRANSVAGINAL OB US  TECHNIQUE: Both transabdominal and transvaginal ultrasound examinations were performed for complete evaluation of the gestation as well as the maternal uterus, adnexal regions, and pelvic cul-de-sac. Transvaginal technique was performed to assess early pregnancy.  COMPARISON:  02/09/2014  FINDINGS: Intrauterine gestational sac: Present  Yolk sac:  Present  Embryo:  Present  Cardiac Activity: Present  Heart Rate:  110 bpm  CRL:   7  mm   6 w 4d                  [place] EDC: 11/22/2014  Maternal uterus/adnexae: Right-sided uterine fibroid measures 3.9 cm. The left ovary is surgically absent. The right ovary demonstrates a 2.7 x 2.0 x 2.4 cm hypoechoic complex lesion within.  Small amount of fluid surrounds the right ovary.  IMPRESSION: 1. Intrauterine pregnancy of approximately 6 weeks 4 days with fetal heart rate of 110 beats beats per min. 2. Probable right ovarian corpus luteal cyst. Ovarian fluid may relate to recent cyst rupture. 3. Right-sided uterine fibroid.   Electronically Signed   By: Abigail Miyamoto M.D.   On: 04/02/2014 12:18     EKG  Interpretation None      MDM   Final diagnoses:  First trimester pregnancy  Nausea and vomiting during pregnancy    Patient's nausea and vomiting is likely viral infection vs the first trimester pregnancy. No vaginal bleeding or severe abdominal pain. Ultrasound shows an IUP with a 6 week fetus. Patient feels better after antiemetics. At this point she is able to take oral fluids. Her blood work is unremarkable. Headache is improved with fluids. Will treat with Zofran at home and recommend follow-up with her OB.  Ephraim Hamburger, MD 04/02/14 1550

## 2014-04-02 NOTE — Discharge Instructions (Signed)
You are 6 weeks and 4 days pregnant today.    Abdominal Pain, Women Abdominal (stomach, pelvic, or belly) pain can be caused by many things. It is important to tell your doctor:  The location of the pain.  Does it come and go or is it present all the time?  Are there things that start the pain (eating certain foods, exercise)?  Are there other symptoms associated with the pain (fever, nausea, vomiting, diarrhea)? All of this is helpful to know when trying to find the cause of the pain. CAUSES   Stomach: virus or bacteria infection, or ulcer.  Intestine: appendicitis (inflamed appendix), regional ileitis (Crohn's disease), ulcerative colitis (inflamed colon), irritable bowel syndrome, diverticulitis (inflamed diverticulum of the colon), or cancer of the stomach or intestine.  Gallbladder disease or stones in the gallbladder.  Kidney disease, kidney stones, or infection.  Pancreas infection or cancer.  Fibromyalgia (pain disorder).  Diseases of the female organs:  Uterus: fibroid (non-cancerous) tumors or infection.  Fallopian tubes: infection or tubal pregnancy.  Ovary: cysts or tumors.  Pelvic adhesions (scar tissue).  Endometriosis (uterus lining tissue growing in the pelvis and on the pelvic organs).  Pelvic congestion syndrome (female organs filling up with blood just before the menstrual period).  Pain with the menstrual period.  Pain with ovulation (producing an egg).  Pain with an IUD (intrauterine device, birth control) in the uterus.  Cancer of the female organs.  Functional pain (pain not caused by a disease, may improve without treatment).  Psychological pain.  Depression. DIAGNOSIS  Your doctor will decide the seriousness of your pain by doing an examination.  Blood tests.  X-rays.  Ultrasound.  CT scan (computed tomography, special type of X-ray).  MRI (magnetic resonance imaging).  Cultures, for infection.  Barium enema (dye inserted  in the large intestine, to better view it with X-rays).  Colonoscopy (looking in intestine with a lighted tube).  Laparoscopy (minor surgery, looking in abdomen with a lighted tube).  Major abdominal exploratory surgery (looking in abdomen with a large incision). TREATMENT  The treatment will depend on the cause of the pain.   Many cases can be observed and treated at home.  Over-the-counter medicines recommended by your caregiver.  Prescription medicine.  Antibiotics, for infection.  Birth control pills, for painful periods or for ovulation pain.  Hormone treatment, for endometriosis.  Nerve blocking injections.  Physical therapy.  Antidepressants.  Counseling with a psychologist or psychiatrist.  Minor or major surgery. HOME CARE INSTRUCTIONS   Do not take laxatives, unless directed by your caregiver.  Take over-the-counter pain medicine only if ordered by your caregiver. Do not take aspirin because it can cause an upset stomach or bleeding.  Try a clear liquid diet (broth or water) as ordered by your caregiver. Slowly move to a bland diet, as tolerated, if the pain is related to the stomach or intestine.  Have a thermometer and take your temperature several times a day, and record it.  Bed rest and sleep, if it helps the pain.  Avoid sexual intercourse, if it causes pain.  Avoid stressful situations.  Keep your follow-up appointments and tests, as your caregiver orders.  If the pain does not go away with medicine or surgery, you may try:  Acupuncture.  Relaxation exercises (yoga, meditation).  Group therapy.  Counseling. SEEK MEDICAL CARE IF:   You notice certain foods cause stomach pain.  Your home care treatment is not helping your pain.  You need stronger pain  medicine.  You want your IUD removed.  You feel faint or lightheaded.  You develop nausea and vomiting.  You develop a rash.  You are having side effects or an allergy to your  medicine. SEEK IMMEDIATE MEDICAL CARE IF:   Your pain does not go away or gets worse.  You have a fever.  Your pain is felt only in portions of the abdomen. The right side could possibly be appendicitis. The left lower portion of the abdomen could be colitis or diverticulitis.  You are passing blood in your stools (bright red or black tarry stools, with or without vomiting).  You have blood in your urine.  You develop chills, with or without a fever.  You pass out. MAKE SURE YOU:   Understand these instructions.  Will watch your condition.  Will get help right away if you are not doing well or get worse. Document Released: 01/14/2007 Document Revised: 08/03/2013 Document Reviewed: 02/03/2009 [facility] Patient Information 2015 [place], [place]. This information is not intended to replace advice given to you by your health care provider. Make sure you discuss any questions you have with your health care provider.     Abdominal Pain During Pregnancy Abdominal pain is common in pregnancy. Most of the time, it does not cause harm. There are many causes of abdominal pain. Some causes are more serious than others. Some of the causes of abdominal pain in pregnancy are easily diagnosed. Occasionally, the diagnosis takes time to understand. Other times, the cause is not determined. Abdominal pain can be a sign that something is very wrong with the pregnancy, or the pain may have nothing to do with the pregnancy at all. For this reason, always tell your health care provider if you have any abdominal discomfort. HOME CARE INSTRUCTIONS  Monitor your abdominal pain for any changes. The following actions may help to alleviate any discomfort you are experiencing:  Do not have sexual intercourse or put anything in your vagina until your symptoms go away completely.  Get plenty of rest until your pain improves.  Drink clear fluids if you feel nauseous. Avoid solid food as long as you are  uncomfortable or nauseous.  Only take over-the-counter or prescription medicine as directed by your health care provider.  Keep all follow-up appointments with your health care provider. SEEK IMMEDIATE MEDICAL CARE IF:  You are bleeding, leaking fluid, or passing tissue from the vagina.  You have increasing pain or cramping.  You have persistent vomiting.  You have painful or bloody urination.  You have a fever.  You notice a decrease in your baby's movements.  You have extreme weakness or feel faint.  You have shortness of breath, with or without abdominal pain.  You develop a severe headache with abdominal pain.  You have abnormal vaginal discharge with abdominal pain.  You have persistent diarrhea.  You have abdominal pain that continues even after rest, or gets worse. MAKE SURE YOU:   Understand these instructions.  Will watch your condition.  Will get help right away if you are not doing well or get worse. Document Released: 03/19/2005 Document Revised: 01/07/2013 Document Reviewed: 10/16/2012 [facility] Patient Information 2015 [place], [place]. This information is not intended to replace advice given to you by your health care provider. Make sure you discuss any questions you have with your health care provider.   Hyperemesis Gravidarum Hyperemesis gravidarum is a severe form of nausea and vomiting that happens during pregnancy. Hyperemesis is worse than morning sickness. It may  cause you to have nausea or vomiting all day for many days. It may keep you from eating and drinking enough food and liquids. Hyperemesis usually occurs during the first half (the first 20 weeks) of pregnancy. It often goes away once a woman is in her second half of pregnancy. However, sometimes hyperemesis continues through an entire pregnancy.  CAUSES  The cause of this condition is not completely known but is thought to be related to changes in the body's hormones when pregnant. It could be  from the high level of the pregnancy hormone or an increase in estrogen in the body.  SIGNS AND SYMPTOMS   Severe nausea and vomiting.  Nausea that does not go away.  Vomiting that does not allow you to keep any food down.  Weight loss and body fluid loss (dehydration).  Having no desire to eat or not liking food you have previously enjoyed. DIAGNOSIS  Your health care provider will do a physical exam and ask you about your symptoms. He or she may also order blood tests and urine tests to make sure something else is not causing the problem.  TREATMENT  You may only need medicine to control the problem. If medicines do not control the nausea and vomiting, you will be treated in the hospital to prevent dehydration, increased acid in the blood (acidosis), weight loss, and changes in the electrolytes in your body that may harm the unborn baby (fetus). You may need IV fluids.  HOME CARE INSTRUCTIONS   Only take over-the-counter or prescription medicines as directed by your health care provider.  Try eating a couple of dry crackers or toast in the morning before getting out of bed.  Avoid foods and smells that upset your stomach.  Avoid fatty and spicy foods.  Eat 5-6 small meals a day.  Do not drink when eating meals. Drink between meals.  For snacks, eat high-protein foods, such as cheese.  Eat or suck on things that have ginger in them. Ginger helps nausea.  Avoid food preparation. The smell of food can spoil your appetite.  Avoid iron pills and iron in your multivitamins until after 3-4 months of being pregnant. However, consult with your health care provider before stopping any prescribed iron pills. SEEK MEDICAL CARE IF:   Your abdominal pain increases.  You have a severe headache.  You have vision problems.  You are losing weight. SEEK IMMEDIATE MEDICAL CARE IF:   You are unable to keep fluids down.  You vomit blood.  You have constant nausea and vomiting.  You  have excessive weakness.  You have extreme thirst.  You have dizziness or fainting.  You have a fever or persistent symptoms for more than 2-3 days.  You have a fever and your symptoms suddenly get worse. MAKE SURE YOU:   Understand these instructions.  Will watch your condition.  Will get help right away if you are not doing well or get worse. Document Released: 03/19/2005 Document Revised: 01/07/2013 Document Reviewed: 10/29/2012 [facility] Patient Information 2015 [place], [place]. This information is not intended to replace advice given to you by your health care provider. Make sure you discuss any questions you have with your health care provider.

## 2014-04-05 ENCOUNTER — Encounter (HOSPITAL_COMMUNITY): Payer: Self-pay | Admitting: *Deleted

## 2014-04-05 ENCOUNTER — Inpatient Hospital Stay (HOSPITAL_COMMUNITY)
Admission: AD | Admit: 2014-04-05 | Discharge: 2014-04-05 | Discharge status: Against Medical Advice | Payer: No Typology Code available for payment source | Source: Ambulatory Visit | Attending: Family Medicine | Admitting: Family Medicine

## 2014-04-05 DIAGNOSIS — O210 Mild hyperemesis gravidarum: Secondary | ICD-10-CM | POA: Diagnosis not present

## 2014-04-05 DIAGNOSIS — Z5320 Procedure and treatment not carried out because of patient's decision for unspecified reasons: Secondary | ICD-10-CM | POA: Insufficient documentation

## 2014-04-05 DIAGNOSIS — Z3A01 Less than 8 weeks gestation of pregnancy: Secondary | ICD-10-CM | POA: Insufficient documentation

## 2014-04-05 LAB — URINE MICROSCOPIC-ADD ON

## 2014-04-05 LAB — URINALYSIS, ROUTINE W REFLEX MICROSCOPIC
Bilirubin Urine: NEGATIVE
GLUCOSE, UA: NEGATIVE mg/dL
KETONES UR: 15 mg/dL — AB
LEUKOCYTES UA: NEGATIVE
Nitrite: NEGATIVE
PROTEIN: NEGATIVE mg/dL
Specific Gravity, Urine: >1.03 — ABNORMAL HIGH (ref 1.005–1.030)
Urobilinogen, UA: 0.2 mg/dL (ref 0.0–1.0)
pH: 6 (ref 5.0–8.0)

## 2014-04-05 NOTE — MAU Note (Signed)
Not in lobby

## 2014-04-05 NOTE — MAU Note (Signed)
Having some severe cramping, feels like period is going to come on, but she is 7 wks preg.  Having bad nausea and vomiting.

## 2014-04-06 ENCOUNTER — Telehealth: Payer: Self-pay | Admitting: General Practice

## 2014-04-06 DIAGNOSIS — O219 Vomiting of pregnancy, unspecified: Secondary | ICD-10-CM

## 2014-04-06 MED ORDER — DOXYLAMINE-PYRIDOXINE 10-10 MG PO TBEC: 10.0000 mg | DELAYED_RELEASE_TABLET | Freq: Every day | ORAL | Status: DC

## 2014-04-06 NOTE — Telephone Encounter (Signed)
Patient called and left message stating she is approximately 7w pregnant having severe nausea and vomiting and has been given zofran to take. She has taken this several times but it doesn't really work and she is afraid to take it due to possible birth defects in the baby. She cannot keep anything down and would like a prescription for something else. Talked to Dr Roselie Awkward, diclegis ordered 10mg  two tablets at bedtime. Med ordered. Called patient and informed her of Rx sent to pharmacy. Patient verbalized understanding and states she always checks everything out so she will look the medication up. Told patient that if she chooses to not take diclegis she can take vitamin b6 100mg  twice a day. Told patient not to take them both together. Patient verbalized understanding and had no questions

## 2014-04-07 ENCOUNTER — Telehealth: Payer: Self-pay | Admitting: *Deleted

## 2014-04-07 LAB — URINE CULTURE
COLONY COUNT: NO GROWTH
CULTURE: NO GROWTH

## 2014-04-07 NOTE — Telephone Encounter (Signed)
[name] called and left a message that she had called earlier and a nurse had spoke with her and sent a prescription for diclegis to [name] but that she called [name] to find out if it was ready and price and they said they did not get the prescription.   I called [name] on [name] and they verified they had received order and it was ready for pickup. Called [name] and verified which pharmacy she called - which was [name] and that it was now ready for pickup.  We discussed maybe she called either before nurse had finished sending order or before pharmacy had had a chance to fill it.

## 2014-04-09 ENCOUNTER — Encounter (HOSPITAL_COMMUNITY): Payer: Self-pay | Admitting: General Practice

## 2014-04-09 ENCOUNTER — Inpatient Hospital Stay (HOSPITAL_COMMUNITY)
Admission: AD | Admit: 2014-04-09 | Discharge: 2014-04-09 | Disposition: A | Discharge status: Home / Self Care | Payer: 59 | Source: Ambulatory Visit | Attending: Obstetrics & Gynecology | Admitting: Obstetrics & Gynecology

## 2014-04-09 DIAGNOSIS — O210 Mild hyperemesis gravidarum: Secondary | ICD-10-CM | POA: Insufficient documentation

## 2014-04-09 DIAGNOSIS — O219 Vomiting of pregnancy, unspecified: Secondary | ICD-10-CM | POA: Diagnosis not present

## 2014-04-09 DIAGNOSIS — N949 Unspecified condition associated with female genital organs and menstrual cycle: Secondary | ICD-10-CM | POA: Diagnosis present

## 2014-04-09 DIAGNOSIS — Z87891 Personal history of nicotine dependence: Secondary | ICD-10-CM | POA: Diagnosis not present

## 2014-04-09 DIAGNOSIS — Z3A01 Less than 8 weeks gestation of pregnancy: Secondary | ICD-10-CM | POA: Diagnosis not present

## 2014-04-09 LAB — URINALYSIS, ROUTINE W REFLEX MICROSCOPIC
Bilirubin Urine: NEGATIVE
Glucose, UA: NEGATIVE mg/dL
HGB URINE DIPSTICK: NEGATIVE
Ketones, ur: 40 mg/dL — AB
Leukocytes, UA: NEGATIVE
Nitrite: NEGATIVE
Protein, ur: NEGATIVE mg/dL
UROBILINOGEN UA: 0.2 mg/dL (ref 0.0–1.0)
pH: 6 (ref 5.0–8.0)

## 2014-04-09 MED ORDER — PROMETHAZINE HCL 25 MG PO TABS: 25.0000 mg | ORAL_TABLET | Freq: Four times a day (QID) | ORAL | Status: DC | PRN

## 2014-04-09 MED ORDER — DEXTROSE 5 % IN LACTATED RINGERS IV BOLUS
1000.0000 mL | Freq: Once | INTRAVENOUS | Status: AC
  Administered 2014-04-09: 1000 mL via INTRAVENOUS

## 2014-04-09 MED ORDER — PROMETHAZINE HCL 25 MG/ML IJ SOLN
25.0000 mg | Freq: Once | INTRAMUSCULAR | Status: AC
  Administered 2014-04-09: 25 mg via INTRAVENOUS
  Filled 2014-04-09: qty 1

## 2014-04-09 MED ORDER — M.V.I. ADULT IV INJ
Freq: Once | INTRAVENOUS | Status: AC
  Administered 2014-04-09: 15:00:00 via INTRAVENOUS
  Filled 2014-04-09: qty 10

## 2014-04-09 NOTE — Discharge Instructions (Signed)

## 2014-04-09 NOTE — MAU Note (Signed)
Pt presents to MAU with complaints of nausea and vomiting since she found out she was pregnant but has been up since 3am and can't quit vomiting. Denies any vaginal bleeding.

## 2014-04-09 NOTE — MAU Provider Note (Signed)
History     CSN: [id number]  Arrival date and time: 04/09/14 1008   First Provider Initiated Contact with Patient 04/09/14 1225      Chief Complaint  Patient presents with  . Pelvic Pain  . Morning Sickness   Pelvic Pain The patient's primary symptoms include pelvic pain. The patient's pertinent negatives include no vaginal bleeding. The current episode started 1 to 4 weeks ago. The pain is severe. The problem affects both sides. She is pregnant. Associated symptoms include abdominal pain (Epigastric region due to vomiting frequency. ), back pain, chills, constipation (Recently constipated. Tried prune juice, stool softener. Found relief with enema - 2 BM since. ), headaches, nausea and vomiting. Pertinent negatives include no dysuria (1 bout of dysuria 2 weeks ago), fever or hematuria. The symptoms are aggravated by activity. She is sexually active. She uses nothing for contraception. Her past medical history is significant for a gynecological surgery, miscarriage and PID.     Past Medical History  Diagnosis Date  . Ovarian cyst   . Abnormal Pap smear   . PID (pelvic inflammatory disease)   . Gonorrhea   . Trichomonas   . Fibroids   . Uterine fibroid 02/15/2014    Past Surgical History  Procedure Laterality Date  . Diagnostic laparoscopy  May 2012  . Laparoscopy N/A 06/26/2012    Procedure: LAPAROSCOPY OPERATIVE with CHROMOPERTUBATION;  Surgeon: Osborne Oman, MD;  Location: [facility];  Service: Gynecology;  Laterality: N/A;  . Salpingoophorectomy Left 06/26/2012    Procedure: SALPINGO OOPHORECTOMY;  Surgeon: Osborne Oman, MD;  Location: [facility];  Service: Gynecology;  Laterality: Left;    Family History  Problem Relation Age of Onset  . Anesthesia problems Neg Hx   . Cystic fibrosis Mother   . Hypertension Mother   . Diabetes Father     History  Substance Use Topics  . Smoking status: Former Smoker -- 1.00 packs/day for 10 years    Types: Cigarettes    Quit date:  04/02/2014  . Smokeless tobacco: Never Used  . Alcohol Use: Yes     Comment: occ-  SOCIAL-    DEC LAST TIME.    Allergies:  Allergies  Allergen Reactions  . Peanut-Containing Drug Products Anaphylaxis  . Spinach Anaphylaxis    Canned spinach  . Nsaids Hives, Itching and Nausea And Vomiting    All NSAIDs cause this reaction. Benadryl does not help. Ibuprofen also causes personality changes.   . Tramadol Itching and Nausea And Vomiting    Able to take Vicodin, Percocet    Prescriptions prior to admission  Medication Sig Dispense Refill Last Dose  . Doxylamine-Pyridoxine 10-10 MG TBEC Take 10 mg by mouth at bedtime. Take two tablets at bedtime daily. 60 tablet 1 04/08/2014 at Unknown time  . Prenatal Vit-Fe Fumarate-FA (PRENATAL MULTIVITAMIN) TABS tablet Take 1 tablet by mouth daily at 12 noon.   04/09/2014 at Unknown time  . HYDROcodone-acetaminophen (NORCO/VICODIN) 5-325 MG per tablet Take 1 tablet by mouth every 6 (six) hours as needed for severe pain. (Patient not taking: Reported on 04/02/2014) 30 tablet 0   . metroNIDAZOLE (FLAGYL) 500 MG tablet Take 1 tablet (500 mg total) by mouth 2 (two) times daily. (Patient not taking: Reported on 04/02/2014) 14 tablet 0   . metroNIDAZOLE (METROGEL VAGINAL) 0.75 % vaginal gel Place 1 Applicatorful vaginally 2 (two) times daily. Start after 1 week course of Flagyl. Use twice a week for 6 months. (Patient not taking: Reported on 04/02/2014)  70 g 4   . ondansetron (ZOFRAN ODT) 4 MG disintegrating tablet 4mg  ODT q4 hours prn nausea/vomiting (Patient not taking: Reported on 04/09/2014) 20 tablet 0     Review of Systems  Constitutional: Positive for chills and malaise/fatigue. Negative for fever.  Eyes: Negative for blurred vision.  Respiratory: Negative for cough and shortness of breath.   Cardiovascular: Negative for chest pain and palpitations.  Gastrointestinal: Positive for nausea, vomiting, abdominal pain (Epigastric region due to vomiting frequency. )  and constipation (Recently constipated. Tried prune juice, stool softener. Found relief with enema - 2 BM since. ). Negative for blood in stool.  Genitourinary: Positive for pelvic pain. Negative for dysuria (1 bout of dysuria 2 weeks ago) and hematuria.  Musculoskeletal: Positive for back pain.  Neurological: Positive for dizziness (Following vomiting. ), weakness and headaches.   Physical Exam   Blood pressure 141/68, pulse 96, temperature 99.2 F (37.3 C), resp. rate 18, height 5\' 1"  (1.549 m), weight 62.596 kg (138 lb), last menstrual period 12/31/2013.  Physical Exam  Constitutional: She is oriented to person, place, and time. She appears well-developed and well-nourished.  Cardiovascular: Normal heart sounds.   Respiratory: Effort normal and breath sounds normal.  GI: Soft. There is tenderness (LUQ and LLQ - believes due to recent constipation).  Neurological: She is alert and oriented to person, place, and time.    MAU Course  Procedures  MDM Administered 1L dextrose 5% LR, 10mg  MVI-12 infused in dextrose 5% LR, and phenergan 25mg  IV. >> Still feeling nauseous. >> Bedside US confirmed viable IUP  Assessment and Plan  1. SIUP at [redacted]w[redacted]d 2. Nausea and vomiting of early pregnancy 3. Pelvic cramping  Plan: 1. Discharge home 2. Antiemetic rx for nausea and vomiting. Take as directed.  3. Prenatal follow up care in [place] PA-S 04/09/2014, 2:13 PM   Upon discharge, overheard patient asking someone to bring her a subway sub. Seen also by me Agree with above note Seabron Spates, CNM

## 2014-04-13 ENCOUNTER — Telehealth: Payer: Self-pay | Admitting: *Deleted

## 2014-04-13 NOTE — Telephone Encounter (Addendum)
Pt left message stating that she is [redacted] wks pregnant. She is having a bad time with constipation and it is causing a lot of pelvic pain.  She further states that she has tried everything - stool softeners, prune juice and enemas. Please call back. I returned pt's call and discussed her concern. She stated that she is taking a stool softener 3 times daily. She used a Fleet enema today and had normal BM. She wants to know what to do going forward. I told her to increase her fruits and vegetables intake as well as 6-8 glasses of water daily. She may take Miralax in addition to the stool softener if needed. I also advised pt to stop taking her prenatal vitamin for about 1 week until her bowel habits have become normal. She can then begin taking them again starting with every other [name] [name] and work back up to daily. Pt expressed frustration because she has been to [facility]U during this pregnancy and was told not to eat so much fruit because of acid reflux. I advised that she not have citrus fruits and substitute with berries, peaches, apples, etc.  Pt voiced understanding.

## 2014-04-27 ENCOUNTER — Telehealth: Payer: Self-pay | Admitting: General Practice

## 2014-04-27 NOTE — Telephone Encounter (Signed)
Called patient and discussed that pregnancy does slow down the bowels and that the iron in the vitamins may be affecting her as well. Discussed maybe trying flinstone vitamins two a day. Discussed continuing stool softeners and miralax and to try putting the miralax in warm apple juice. Discussed high fiber like a salad and a can of beans a day, eating prunes, and choosing whole grains over white breads. Also discussed continuing to increase her water intake and to avoid fast food or fried greasy foods. Told patient that she can also discuss all this with Dr Harolyn Rutherford at her appt next Thursday. Patient verbalized understanding to all and had no other questions

## 2014-04-27 NOTE — Telephone Encounter (Signed)
Patient called and left message stating she is still having severe constipation. States she has tried prune juice, stool softeners, miralax and nothing has helped. States she has to give herself an enema every other day in order to go to the bathroom. Patient states she doesn't know if she needs to call us for this or another doctor, please advise.

## 2014-05-06 ENCOUNTER — Ambulatory Visit (INDEPENDENT_AMBULATORY_CARE_PROVIDER_SITE_OTHER): Payer: No Typology Code available for payment source | Admitting: Obstetrics & Gynecology

## 2014-05-06 ENCOUNTER — Encounter: Payer: Self-pay | Admitting: Obstetrics & Gynecology

## 2014-05-06 VITALS — BP 115/70 | HR 74 | Temp 99.1°F | Wt 137.9 lb

## 2014-05-06 DIAGNOSIS — O2621 Pregnancy care for patient with recurrent pregnancy loss, first trimester: Secondary | ICD-10-CM

## 2014-05-06 DIAGNOSIS — O2620 Pregnancy care for patient with recurrent pregnancy loss, unspecified trimester: Secondary | ICD-10-CM | POA: Insufficient documentation

## 2014-05-06 DIAGNOSIS — O3680X0 Pregnancy with inconclusive fetal viability, not applicable or unspecified: Secondary | ICD-10-CM

## 2014-05-06 DIAGNOSIS — O3680X1 Pregnancy with inconclusive fetal viability, fetus 1: Secondary | ICD-10-CM

## 2014-05-06 LAB — POCT URINALYSIS DIP (DEVICE)
BILIRUBIN URINE: NEGATIVE
GLUCOSE, UA: NEGATIVE mg/dL
Hgb urine dipstick: NEGATIVE
KETONES UR: NEGATIVE mg/dL
LEUKOCYTES UA: NEGATIVE
NITRITE: NEGATIVE
Protein, ur: NEGATIVE mg/dL
Specific Gravity, Urine: 1.02 (ref 1.005–1.030)
Urobilinogen, UA: 0.2 mg/dL (ref 0.0–1.0)
pH: 7 (ref 5.0–8.0)

## 2014-05-06 LAB — US OB COMP LESS 14 WKS

## 2014-05-06 NOTE — Progress Notes (Signed)
Reports intense pelvic pain/pressure all the time

## 2014-05-06 NOTE — Progress Notes (Signed)
Bedside US for viability = single IUP, FHR = 154 per PW doppler, FM present.  Dr. Harolyn Rutherford notified of results

## 2014-05-06 NOTE — Progress Notes (Signed)
Nutrition note: 1st visit consult Pt has lost 6.1# @ [redacted]w[redacted]d. Pt has been having a lot of N/V. Pt reports eating 3-4x/d depending on N/V. Pt is taking a PNV.  Pt reports no heartburn. Pt received verbal & written education on general nutrition during pregnancy. Discussed tips to decrease N/V. Encouraged protein with all meals & snacks. Discussed wt gain goals of 15-25# or 0.6#/wk in 2nd & 3rd trimester. Pt agrees to continue taking a PNV. Pt does not have [place] but plans to apply. Pt plans to BF. F/u in 4-6 wks Vladimir Faster, MS, RD, LDN, [facility]

## 2014-05-06 NOTE — Patient Instructions (Signed)
First Trimester of Pregnancy The first trimester of pregnancy is from week 1 until the end of week 12 (months 1 through 3). A week after a sperm fertilizes an egg, the egg will implant on the wall of the uterus. This embryo will begin to develop into a baby. Genes from you and your partner are forming the baby. The female genes determine whether the baby is a boy or a girl. At 6-8 weeks, the eyes and face are formed, and the heartbeat can be seen on ultrasound. At the end of 12 weeks, all the baby's organs are formed.  Now that you are pregnant, you will want to do everything you can to have a healthy baby. Two of the most important things are to get good prenatal care and to follow your health care provider's instructions. Prenatal care is all the medical care you receive before the baby's birth. This care will help prevent, find, and treat any problems during the pregnancy and childbirth. BODY CHANGES Your body goes through many changes during pregnancy. The changes vary from woman to woman.   You may gain or lose a couple of pounds at first.  You may feel sick to your stomach (nauseous) and throw up (vomit). If the vomiting is uncontrollable, call your health care provider.  You may tire easily.  You may develop headaches that can be relieved by medicines approved by your health care provider.  You may urinate more often. Painful urination may mean you have a bladder infection.  You may develop heartburn as a result of your pregnancy.  You may develop constipation because certain hormones are causing the muscles that push waste through your intestines to slow down.  You may develop hemorrhoids or swollen, bulging veins (varicose veins).  Your breasts may begin to grow larger and become tender. Your nipples may stick out more, and the tissue that surrounds them (areola) may become darker.  Your gums may bleed and may be sensitive to brushing and flossing.  Dark spots or blotches (chloasma,  mask of pregnancy) may develop on your face. This will likely fade after the baby is born.  Your menstrual periods will stop.  You may have a loss of appetite.  You may develop cravings for certain kinds of food.  You may have changes in your emotions from day to day, such as being excited to be pregnant or being concerned that something may go wrong with the pregnancy and baby.  You may have more vivid and strange dreams.  You may have changes in your hair. These can include thickening of your hair, rapid growth, and changes in texture. Some women also have hair loss during or after pregnancy, or hair that feels dry or thin. Your hair will most likely return to normal after your baby is born. WHAT TO EXPECT AT YOUR PRENATAL VISITS During a routine prenatal visit:  You will be weighed to make sure you and the baby are growing normally.  Your blood pressure will be taken.  Your abdomen will be measured to track your baby's growth.  The fetal heartbeat will be listened to starting around week 10 or 12 of your pregnancy.  Test results from any previous visits will be discussed. Your health care provider may ask you:  How you are feeling.  If you are feeling the baby move.  If you have had any abnormal symptoms, such as leaking fluid, bleeding, severe headaches, or abdominal cramping.  If you have any questions. Other tests   that may be performed during your first trimester include:  Blood tests to find your blood type and to check for the presence of any previous infections. They will also be used to check for low iron levels (anemia) and Rh antibodies. Later in the pregnancy, blood tests for diabetes will be done along with other tests if problems develop.  Urine tests to check for infections, diabetes, or protein in the urine.  An ultrasound to confirm the proper growth and development of the baby.  An amniocentesis to check for possible genetic problems.  Fetal screens for  spina bifida and Down syndrome.  You may need other tests to make sure you and the baby are doing well. HOME CARE INSTRUCTIONS  Medicines  Follow your health care provider's instructions regarding medicine use. Specific medicines may be either safe or unsafe to take during pregnancy.  Take your prenatal vitamins as directed.  If you develop constipation, try taking a stool softener if your health care provider approves. Diet  Eat regular, well-balanced meals. Choose a variety of foods, such as meat or vegetable-based protein, fish, milk and low-fat dairy products, vegetables, fruits, and whole grain breads and cereals. Your health care provider will help you determine the amount of weight gain that is right for you.  Avoid raw meat and uncooked cheese. These carry germs that can cause birth defects in the baby.  Eating four or five small meals rather than three large meals a day may help relieve nausea and vomiting. If you start to feel nauseous, eating a few soda crackers can be helpful. Drinking liquids between meals instead of during meals also seems to help nausea and vomiting.  If you develop constipation, eat more high-fiber foods, such as fresh vegetables or fruit and whole grains. Drink enough fluids to keep your urine clear or pale yellow. Activity and Exercise  Exercise only as directed by your health care provider. Exercising will help you:  Control your weight.  Stay in shape.  Be prepared for labor and delivery.  Experiencing pain or cramping in the lower abdomen or low back is a good sign that you should stop exercising. Check with your health care provider before continuing normal exercises.  Try to avoid standing for long periods of time. Move your legs often if you must stand in one place for a long time.  Avoid heavy lifting.  Wear low-heeled shoes, and practice good posture.  You may continue to have sex unless your health care provider directs you  otherwise. Relief of Pain or Discomfort  Wear a good support bra for breast tenderness.   Take warm sitz baths to soothe any pain or discomfort caused by hemorrhoids. Use hemorrhoid cream if your health care provider approves.   Rest with your legs elevated if you have leg cramps or low back pain.  If you develop varicose veins in your legs, wear support hose. Elevate your feet for 15 minutes, 3-4 times a day. Limit salt in your diet. Prenatal Care  Schedule your prenatal visits by the twelfth week of pregnancy. They are usually scheduled monthly at first, then more often in the last 2 months before delivery.  Write down your questions. Take them to your prenatal visits.  Keep all your prenatal visits as directed by your health care provider. Safety  Wear your seat belt at all times when driving.  Make a list of emergency phone numbers, including numbers for family, friends, the hospital, and police and fire departments. General Tips    Ask your health care provider for a referral to a local prenatal education class. Begin classes no later than at the beginning of month 6 of your pregnancy.  Ask for help if you have counseling or nutritional needs during pregnancy. Your health care provider can offer advice or refer you to specialists for help with various needs.  Do not use hot tubs, steam rooms, or saunas.  Do not douche or use tampons or scented sanitary pads.  Do not cross your legs for long periods of time.  Avoid cat litter boxes and soil used by cats. These carry germs that can cause birth defects in the baby and possibly loss of the fetus by miscarriage or stillbirth.  Avoid all smoking, herbs, alcohol, and medicines not prescribed by your health care provider. Chemicals in these affect the formation and growth of the baby.  Schedule a dentist appointment. At home, brush your teeth with a soft toothbrush and be gentle when you floss. SEEK MEDICAL CARE IF:   You have  dizziness.  You have mild pelvic cramps, pelvic pressure, or nagging pain in the abdominal area.  You have persistent nausea, vomiting, or diarrhea.  You have a bad smelling vaginal discharge.  You have pain with urination.  You notice increased swelling in your face, hands, legs, or ankles. SEEK IMMEDIATE MEDICAL CARE IF:   You have a fever.  You are leaking fluid from your vagina.  You have spotting or bleeding from your vagina.  You have severe abdominal cramping or pain.  You have rapid weight gain or loss.  You vomit blood or material that looks like coffee grounds.  You are exposed to [place] measles and have never had them.  You are exposed to fifth disease or chickenpox.  You develop a severe headache.  You have shortness of breath.  You have any kind of trauma, such as from a fall or a car accident. Document Released: 03/13/2001 Document Revised: 08/03/2013 Document Reviewed: 01/27/2013 [facility] Patient Information 2015 [place], [place]. This information is not intended to replace advice given to you by your health care provider. Make sure you discuss any questions you have with your health care provider.   Cervical Cerclage Cervical cerclage is a surgical procedure for an incompetent cervix. An incompetent cervix is a weak cervix that opens up before labor begins. Cervical cerclage is a procedure in which the cervix is sewn closed during pregnancy.  LET [facility] CARE PROVIDER KNOW ABOUT:   Any allergies you have.  All medicines you are taking, including vitamins, herbs, eye drops, creams, and over-the-counter medicines.  Previous problems you or members of your family have had with the use of anesthetics.  Any blood disorders you have.  Previous surgeries you have had.  Medical conditions you have.  Any recent colds or infections. RISKS AND COMPLICATIONS  Generally, this is a safe procedure. However, as with any procedure, problems can occur.  Possible problems include:  Infection.  Bleeding.  Rupturing the amniotic sac (membranes).  Going into early labor and delivery.  Problems with the anesthetics.  Infection of the amniotic sac. BEFORE THE PROCEDURE   Ask your health care provider about changing or stopping your medicines.  Do not eat or drink anything for 6-8 hours before the procedure.  Arrange for someone to drive you home after the procedure. PROCEDURE   An IV tube will be placed in your vein. You will be given a sedative to help you relax.  You will be given  a medicine that makes you sleep through the procedure (general anesthetic) or a medicine injected into your spine that numbs your body below the waist (spinal or epidural anesthetic). You will be asleep or be numbed through the entire procedure.  A speculum will be placed in your vagina to visualize your cervix.  The cervix is then grasped and stitched closed tightly.  Ultrasound may be used to guide the procedure and monitor the baby. AFTER THE PROCEDURE   You will go to a recovery room where you and your unborn baby are monitored. Once you are awake, stable, and taking fluids well, you will be allowed to return to your room.  You will usually stay in the hospital overnight.  You may get an injection of progesterone to prevent uterine contractions.  You may be given pain-relieving medicines to take with you when you go home.  Have someone drive you home and stay with you for up to 2 days. Document Released: 03/01/2008 Document Revised: 03/24/2013 Document Reviewed: 10/08/2012 [facility] Patient Information 2015 [place], [place]. This information is not intended to replace advice given to you by your health care provider. Make sure you discuss any questions you have with your health care provider.

## 2014-05-06 NOTE — Progress Notes (Signed)
Subjective:    [name] [name] is a 34 y.o. [phone] at [redacted]w[redacted]d being seen today for her first obstetrical visit.  Her obstetrical history is significant for chronic pelvic pain due to chronic PID, recurrent SAB x 3. Reports having SABs around 20 -28 weeks, unsure if she was told she had cervical issues.  Will attempt to get records. Pregnancy history fully reviewed.  Patient reports nausea and vomiting, takes medications "which do not work well". Declines further medication.  Filed Vitals:   05/06/14 0904  BP: 115/70  Pulse: 74  Temp: 99.1 F (37.3 C)  Weight: 137 lb 14.4 oz (62.551 kg)    HISTORY: OB History  Gravida Para Term Preterm AB SAB TAB Ectopic Multiple Living  4 2 0 2 1 0 0 0 0 0     # Outcome Date GA Lbr Len/2nd Weight Sex Delivery Anes PTL Lv  4 Current           3 AB 04/02/00 [redacted]w[redacted]d    Vag-Spont  N FD  2 Preterm 04/02/98 [redacted]w[redacted]d    Vag-Spont  N FD     Complications: Abruptio Placenta  1 Preterm  [redacted]w[redacted]d    Vag-Spont  N FD     Past Medical History  Diagnosis Date  . Ovarian cyst   . Abnormal Pap smear   . PID (pelvic inflammatory disease)   . Gonorrhea   . Trichomonas   . Fibroids   . Uterine fibroid 02/15/2014   Past Surgical History  Procedure Laterality Date  . Diagnostic laparoscopy  May 2012  . Laparoscopy N/A 06/26/2012    Procedure: LAPAROSCOPY OPERATIVE with CHROMOPERTUBATION;  Surgeon: Osborne Oman, MD;  Location: [facility];  Service: Gynecology;  Laterality: N/A;  . Salpingoophorectomy Left 06/26/2012    Procedure: SALPINGO OOPHORECTOMY;  Surgeon: Osborne Oman, MD;  Location: [facility];  Service: Gynecology;  Laterality: Left;   Family History  Problem Relation Age of Onset  . Anesthesia problems Neg Hx   . Cystic fibrosis Mother   . Hypertension Mother   . Diabetes Mother      Exam   Normal pap smear and negative HPV in 08/2013 Uterus:     Pelvic Exam: Deferred  System: Breast:  normal appearance, no masses or tenderness, Deferred   Skin: normal coloration and turgor, no rashes   Neurologic: oriented, normal   Extremities: normal strength, tone, and muscle mass   HEENT PERRLA and extra ocular movement intact   Mouth/Teeth mucous membranes moist, pharynx normal without lesions and dental hygiene good   Neck supple and no masses   Cardiovascular: regular rate and rhythm   Respiratory:  appears well, vitals normal, no respiratory distress, acyanotic, normal RR, chest clear, no wheezing, crepitations, rhonchi, normal symmetric air entry   Abdomen: soft, non-tender; bowel sounds normal; no masses,  no organomegaly   FHR not auscultated on doppler;  clinic ultrasound showed viable SIUP with FHR of 156   Assessment:    Pregnancy: [id number] Patient Active Problem List   Diagnosis Date Noted  . Uterine fibroid 02/15/2014  . Chronic pelvic pain in female 02/09/2014  . Abnormal uterine bleeding 12/05/2012  . 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 Chronic Pelvic Inflammatory Disease 06/26/2012  . Pelvic pain in female 11/01/2010  . Dysmenorrhea 11/01/2010     Plan:     Initial labs drawn including early 1 hr GTT. Continue Prenatal  vitamins. Problem list reviewed and updated. Genetic Screening discussed First Screen: ordered and MFM consult also ordered given history. Ultrasound discussed; fetal survey: to be ordered later. The nature of [place] with multiple MDs and other Advanced Practitioners was explained to patient; also emphasized that residents, students are part of our team. Follow up in 4 weeks.  Routine obstetric precautions reviewed.   Osborne Oman, MD 05/06/2014

## 2014-05-06 NOTE — Progress Notes (Signed)
First Screen and genetic counseling 05/18/14 @ 915a with [place].

## 2014-05-07 LAB — PRENATAL PROFILE (SOLSTAS)
ANTIBODY SCREEN: NEGATIVE
BASOS ABS: 0.1 10*3/uL (ref 0.0–0.1)
BASOS PCT: 1 % (ref 0–1)
Eosinophils Absolute: 0.1 10*3/uL (ref 0.0–0.7)
Eosinophils Relative: 2 % (ref 0–5)
HCT: 34.3 % — ABNORMAL LOW (ref 36.0–46.0)
HEP B S AG: NEGATIVE
HIV 1&2 Ab, 4th Generation: NONREACTIVE
Hemoglobin: 11.6 g/dL — ABNORMAL LOW (ref 12.0–15.0)
Lymphocytes Relative: 24 % (ref 12–46)
Lymphs Abs: 1.4 10*3/uL (ref 0.7–4.0)
MCH: 27.5 pg (ref 26.0–34.0)
MCHC: 33.8 g/dL (ref 30.0–36.0)
MCV: 81.3 fL (ref 78.0–100.0)
MPV: 9.8 fL (ref 8.6–12.4)
Monocytes Absolute: 0.6 10*3/uL (ref 0.1–1.0)
Monocytes Relative: 10 % (ref 3–12)
NEUTROS ABS: 3.7 10*3/uL (ref 1.7–7.7)
Neutrophils Relative %: 63 % (ref 43–77)
Platelets: 317 10*3/uL (ref 150–400)
RBC: 4.22 MIL/uL (ref 3.87–5.11)
RDW: 14.1 % (ref 11.5–15.5)
RH TYPE: POSITIVE
RUBELLA: 1.22 {index} — AB (ref <0.90)
WBC: 5.9 10*3/uL (ref 4.0–10.5)

## 2014-05-07 LAB — GLUCOSE TOLERANCE, 1 HOUR (50G) W/O FASTING: GLUCOSE 1 HOUR GTT: 97 mg/dL (ref 70–140)

## 2014-05-08 LAB — CANNABANOIDS (GC/LC/MS), URINE: THC-COOH UR CONFIRM: 395 ng/mL — AB (ref <5)

## 2014-05-08 LAB — CULTURE, OB URINE
COLONY COUNT: NO GROWTH
Organism ID, Bacteria: NO GROWTH

## 2014-05-10 LAB — HEMOGLOBINOPATHY EVALUATION
HGB S QUANTITAION: 0 %
Hemoglobin Other: 0 %
Hgb A2 Quant: 2.8 % (ref 2.2–3.2)
Hgb A: 97.2 % (ref 96.8–97.8)
Hgb F Quant: 0 % (ref 0.0–2.0)

## 2014-05-11 LAB — PRESCRIPTION MONITORING PROFILE (19 PANEL)
Amphetamine/Meth: NEGATIVE ng/mL
BENZODIAZEPINE SCREEN, URINE: NEGATIVE ng/mL
BUPRENORPHINE, URINE: NEGATIVE ng/mL
Barbiturate Screen, Urine: NEGATIVE ng/mL
CARISOPRODOL, URINE: NEGATIVE ng/mL
COCAINE METABOLITES: NEGATIVE ng/mL
Creatinine, Urine: 177.64 mg/dL (ref >20.0)
ECSTASY: NEGATIVE ng/mL
Fentanyl, Ur: NEGATIVE ng/mL
METHAQUALONE SCREEN (URINE): NEGATIVE ng/mL
Meperidine, Ur: NEGATIVE ng/mL
Methadone Screen, Urine: NEGATIVE ng/mL
Nitrites, Initial: NEGATIVE ug/mL
Opiate Screen, Urine: NEGATIVE ng/mL
Oxycodone Screen, Ur: NEGATIVE ng/mL
PHENCYCLIDINE, UR: NEGATIVE ng/mL
Propoxyphene: NEGATIVE ng/mL
Tapentadol, urine: NEGATIVE ng/mL
Tramadol Scrn, Ur: NEGATIVE ng/mL
ZOLPIDEM, URINE: NEGATIVE ng/mL
pH, Initial: 7.2 pH (ref 4.5–8.9)

## 2014-05-18 ENCOUNTER — Ambulatory Visit (HOSPITAL_COMMUNITY)
Admission: RE | Admit: 2014-05-18 | Discharge: 2014-05-18 | Disposition: A | Discharge status: Home / Self Care | Payer: 59 | Source: Ambulatory Visit | Attending: Obstetrics & Gynecology | Admitting: Obstetrics & Gynecology

## 2014-05-18 ENCOUNTER — Encounter (HOSPITAL_COMMUNITY): Payer: No Typology Code available for payment source

## 2014-05-18 ENCOUNTER — Encounter (HOSPITAL_COMMUNITY): Payer: Self-pay

## 2014-05-18 DIAGNOSIS — D259 Leiomyoma of uterus, unspecified: Secondary | ICD-10-CM | POA: Insufficient documentation

## 2014-05-18 DIAGNOSIS — Z3A13 13 weeks gestation of pregnancy: Secondary | ICD-10-CM | POA: Diagnosis not present

## 2014-05-18 DIAGNOSIS — O2621 Pregnancy care for patient with recurrent pregnancy loss, first trimester: Secondary | ICD-10-CM

## 2014-05-18 DIAGNOSIS — O3411 Maternal care for benign tumor of corpus uteri, first trimester: Secondary | ICD-10-CM | POA: Insufficient documentation

## 2014-05-18 DIAGNOSIS — O99331 Smoking (tobacco) complicating pregnancy, first trimester: Secondary | ICD-10-CM | POA: Insufficient documentation

## 2014-05-18 DIAGNOSIS — F17210 Nicotine dependence, cigarettes, uncomplicated: Secondary | ICD-10-CM | POA: Diagnosis not present

## 2014-05-18 DIAGNOSIS — Z369 Encounter for antenatal screening, unspecified: Secondary | ICD-10-CM | POA: Insufficient documentation

## 2014-05-18 DIAGNOSIS — O3412 Maternal care for benign tumor of corpus uteri, second trimester: Secondary | ICD-10-CM

## 2014-05-18 LAB — PERIPHERAL BLOOD ROUTINE CHROMOSOME - KARYOTYPE

## 2014-05-18 NOTE — Progress Notes (Signed)
MATERNAL FETAL MEDICINE CONSULT  Patient Name: [name] [name] Record Number:  [id number] Date of Birth: 05/05/1980 Requesting Physician Name:  Osborne Oman, MD Date of Service: 05/18/2014  Chief Complaint Multiple second trimester losses  History of Present Illness [name] [name] was seen today secondary to multiple second trimester losses at the request of Osborne Oman, MD.  The patient is a 34 y.o. G4P0300,at [redacted]w[redacted]d with an EDD of 11/22/2014, by Ultrasound dating method.  She has a history of three IUFDs, 2 at 20 weeks and one at 28 weeks (which was a results of an abruption per patient).  She reports that in all three instances the fetuses were found to have died before labor began.  In her first two pregnancies she was unaware she was pregnant until the IUFDs were diagnosed.  She reports that no genetic testing was done on any of her fetal losses.  She has not had a diagnostic workup to determine the cause of her multiple fetal losses.  [name] [name] is also known to have uterine fibroids.  They do not currently cause her pain, but she attributes urinary frequency to one of the fibroids that is adjacent to the bladder.  Review of Systems Pertinent items are noted in HPI.  Patient History OB History  Gravida Para Term Preterm AB SAB TAB Ectopic Multiple Living  4 3 0 3 0 0 0 0 0 0     # Outcome Date GA Lbr Len/2nd Weight Sex Delivery Anes PTL Lv  4 Current           3 Preterm 04/02/00 [redacted]w[redacted]d    Vag-Spont  N FD  2 Preterm 04/02/98 [redacted]w[redacted]d    Vag-Spont  N FD     Complications: Abruptio Placenta  1 Preterm  [redacted]w[redacted]d    Vag-Spont  N FD      Past Medical History  Diagnosis Date  . Ovarian cyst   . Abnormal Pap smear   . PID (pelvic inflammatory disease)   . Gonorrhea   . Trichomonas   . Fibroids   . Uterine fibroid 02/15/2014    Past Surgical History  Procedure Laterality Date  . Diagnostic laparoscopy  May 2012  . Laparoscopy N/A 06/26/2012    Procedure:  LAPAROSCOPY OPERATIVE with CHROMOPERTUBATION;  Surgeon: Osborne Oman, MD;  Location: [facility];  Service: Gynecology;  Laterality: N/A;  . Salpingoophorectomy Left 06/26/2012    Procedure: SALPINGO OOPHORECTOMY;  Surgeon: Osborne Oman, MD;  Location: [facility];  Service: Gynecology;  Laterality: Left;    History   Social History  . Marital Status: Married    Spouse Name: N/A  . Number of Children: N/A  . Years of Education: N/A   Social History Main Topics  . Smoking status: Current Every Day Smoker -- 1.00 packs/day for 10 years    Types: Cigarettes    Last Attempt to Quit: 04/02/2014  . Smokeless tobacco: Never Used  . Alcohol Use: No  . Drug Use: Yes    Special: Marijuana     Comment: occasional marijuana  . Sexual Activity: Yes    Birth Control/ Protection: None     Comment: Last Depo injection 09/01/2012   Other Topics Concern  . Not on file   Social History Narrative    Family History  Problem Relation Age of Onset  . Anesthesia problems Neg Hx   . Cystic fibrosis Mother   . Hypertension Mother   . Diabetes Mother    In  addition, the patient has no family history of mental retardation, birth defects, or genetic diseases.  Physical Examination There were no vitals filed for this visit. General appearance - alert, well appearing, and in no distress  Assessment and Recommendations 1.  Multiple fetal losses.  Clearly after three second trimester fetal deaths [name] [name] warrants further assessment.  We have ordered labs to assess for anti-phospholipid syndrome and maternal karyotype.  A thorough assessment of the uterine cavity is not possible during pregnancy.  Moreover, her imaging thus far both before and during pregnancy does not suggest a uterine abnormality.  As [name] [name] reports that in all three of her losses fetal death occurred followed by labor, not the other way around.  Thus, I do not think that her history is consistent with spontaneous preterm  labor making a cerclage of questionable benefit to [name] [name].  However, less invasive treatments such as 17-hydroxyprogesterone and cervical length surveillance could be considered.  After discussing these options with [name] [name] she decided to forgo 17-hydroxyprogesterone but wished to have cervical length surveillance.  This will start in approximately 3 weeks (i.e. 16 weeks of gestation) and should be performed every other week until 26 weeks.  They should be performed more frequent if the cervical length is noted to decrease over time.  If it shortens to less that 2.5 cm, a cerclage could be considered, but would be of uncertain benefit for [name] [name] given that her history is not 100% consistent with prior spontaneous preterm labor. 2.  Uterine fibroids.  Her uterine fibroids are of modest size at this time and do not impinge upon the uterine cavity.  One lower uterine segment/cervical subserosal fibroid abuts and significantly deforms the maternal bladder.  Should this fibroid grow it is likely to obstruct the pelvis and prevent a vaginal delivery.  This and her other fibroids should be monitored throughout pregnancy, particular late in the third trimester, to determine if a vaginal delivery will be possible based on the size and position of the lower uterine segment/cervical fibroid.  I spent 30 minutes with [name] [name] today of which 50% was face-to-face counseling.  Thank you for referring [name] [name] to the [facility].  Please do not hesitate to contact us with questions.   Jolyn Lent, MD

## 2014-05-19 LAB — SCREEN, FIRST TRIMESTER, SERUM: FIRST TRIMESTER SAMPLE: NEGATIVE

## 2014-05-19 LAB — LUPUS ANTICOAGULANT PANEL
DRVVT: 39.6 s (ref 0.0–55.1)
PTT Lupus Anticoagulant: 32.7 s (ref 0.0–50.0)

## 2014-05-20 ENCOUNTER — Other Ambulatory Visit: Payer: Self-pay | Admitting: Obstetrics & Gynecology

## 2014-05-20 ENCOUNTER — Telehealth (HOSPITAL_COMMUNITY): Payer: Self-pay | Admitting: *Deleted

## 2014-05-20 DIAGNOSIS — O3412 Maternal care for benign tumor of corpus uteri, second trimester: Secondary | ICD-10-CM

## 2014-05-20 DIAGNOSIS — O09292 Supervision of pregnancy with other poor reproductive or obstetric history, second trimester: Secondary | ICD-10-CM

## 2014-05-20 DIAGNOSIS — D259 Leiomyoma of uterus, unspecified: Secondary | ICD-10-CM

## 2014-05-20 DIAGNOSIS — O2620 Pregnancy care for patient with recurrent pregnancy loss, unspecified trimester: Secondary | ICD-10-CM | POA: Insufficient documentation

## 2014-05-20 LAB — BETA-2-GLYCOPROTEIN I ABS, IGG/M/A
Beta-2 Glyco I IgG: <9 GPI IgG units (ref 0–20)
Beta-2-Glycoprotein I IgA: 43 GPI IgA units — ABNORMAL HIGH (ref 0–25)
Beta-2-Glycoprotein I IgM: 25 GPI IgM units (ref 0–32)

## 2014-05-20 LAB — CARDIOLIPIN ANTIBODIES, IGG, IGM, IGA
Anticardiolipin IgG: <9 GPL U/mL (ref 0–14)
Anticardiolipin IgM: <9 MPL U/mL (ref 0–12)

## 2014-05-20 NOTE — Progress Notes (Signed)
Genetic Counseling  High-Risk Gestation Note  Appointment Date:  05/20/2014 Referred By: [name] Oman, MD Date of Birth:  1981-01-07   Pregnancy History: G4P0300 Estimated Date of Delivery: 11/22/14 Estimated Gestational Age: 48w1dAttending: JJolyn Lent MD   I met with Ms. [name] Arabiafor genetic counseling because of a history of three pregnancy losses. [employer] Genetic Counseling Intern, [name] [name] assisted with genetic counseling under my direct supervision.   We began by reviewing the family history in detail. Mrs. GGatchellreported that her first two pregnancies were with her first husband and both resulted in a demise at approximately 261 weeksgestation. She reported that she did not know that she was pregnant during those pregnancies. Limited information was known regarding whether or not congenital anomalies were present. The patient reported that her third pregnancy, which was the first pregnancy with her current husband, resulted in demise at approximately 28 weeks due to a detached placenta. The underlying cause for each loss has not been determined.   We discussed that recurrent pregnancy losses is defined as the occurrence of 2-3+ pregnancy losses.  She was counseled that ~15-20% of all clinically recognized pregnancies result in miscarriage and that 80% occur during the first trimester of pregnancy.  We reviewed the multiple etiologies for recurrent pregnancy losses including: maternal disorders (thyroid disease, diabetes, lupus, etc), anatomical differences (incompetent cervix, abnormal uterine position or shape), environmental causes (nutrition, drugs, alcohol, infections), and genetic causes (chromosome differences, inherited clotting disorders).  Regarding genetic causes, we discussed that once a woman has had 2-3 or more unexplained pregnancy losses, the risk for an underlying chromosome rearrangement in one of the parents (translocation or inversion) is ~3-5%. We  discussed that pregnancy loss can also occur due to a sporadic chromosome imbalance, such as a fetal trisomy. We discussed that peripheral blood chromosome analysis would be available to [name] [name] assess for an underlying chromosome rearrangement. She elected to proceed with this testing today.    Ms. GKanzleralso had a maternal-fetal medicine consultation today to discuss additional possible underlying etiologies for pregnancy loss. Please see separate MFM note for detailed discussion and additional work-up/follow-up recommendations that were discussed given this history.   The family histories were otherwise found to be contributory for a maternal aunt to the patient with a history of 3-4 miscarriages. No information was known regarding whether or not an etiology was determined. She reported that this aunt has one son with autism. He is 175years old and the underlying etiology is not known. He was not described to have dysmorphic features. We discussed that autism is part of the spectrum of conditions referred to as Autistic spectrum disorders (ASD). We discussed that ASDs are among the most common neurodevelopmental disorders, with approximately 1 in 88 children meeting criteria for ASD. Approximately 80% of individuals diagnosed are female. There is strong evidence that genetic factors play a critical role in development of ASD. There have been recent advances in identifying specific genetic causes of ASD, however, there are still many individuals for whom the etiology of the ASD is not known. Once a family has a child with a diagnosis of ASD, there is a 13.5% chance to have another child with ASD. If the pregnancy is female the chance is approximately 9%, and approximately 26% if the pregnancy is female. Recurrence risk data are limited for extended family members of an individual with autism in the absence of an identified etiology.  They understand that at this time  there is not prenatal genetic  testing or screening available for ASD for most families. Without further information regarding the provided family history, an accurate genetic risk cannot be calculated. Further genetic counseling is warranted if more information is obtained.   We reviewed available screening options.  We specifically discussed the screening option of first trimester screening. She understands that screening tests are used to modify a patient's a priori risk for aneuploidy, typically based on age.  This estimate provides a pregnancy specific risk assessment.  We also reviewed the availability of diagnostic options including CVS and amniocentesis.  We discussed the risks, limitations, and benefits of each.  Complete ultrasound results reported separately. [name] [name] elected to have first trimester screening today.   [name] [name] was provided with written information regarding sickle cell anemia (SCA) including the carrier frequency and incidence in the African-American population, the availability of carrier testing and prenatal diagnosis if indicated.  In addition, we discussed that hemoglobinopathies are routinely screened for as part of the [place] newborn screening panel. [name] [name] reported that one of her sisters has sickle cell trait.  We reviewed that hemoglobin electrophoresis was performed through her primary OB and revealed the presence of apparently normal adult hemoglobin.    [name] [name] denied exposure to environmental toxins or chemical agents. She denied the use of street drugs. She reported smoking one cigarette and continues to cut back. The associations of smoking in pregnancy were reviewed and cessation encouraged. She reported drinking alcohol at Thanksgiving, prior to being aware of the pregnancy. The all-or-none period was discussed, meaning exposures that occur in the first 4 weeks of gestation are typically thought to either not affect the pregnancy at all or result in a  miscarriage.  Prenatal alcohol exposure can increase the risk for growth delays, small head size, heart defects, eye and facial differences, as well as behavior problems and learning disabilities. The risk of these to occur tends to increase with the amount of alcohol consumed. However, because there is no identified safe amount of alcohol in pregnancy, it is recommended to completely avoid alcohol in pregnancy. Given the reported amount of exposure, risk for associated effects are likely low in the current pregnancy. She denied significant viral illnesses during the course of her pregnancy. Her medical and surgical histories were otherwise noncontributory.   I counseled [name] [name] regarding the above risks and available options.  The approximate face-to-face time with the genetic counselor was 40 minutes.   Chipper Oman, MS Certified Genetic Counselor 05/20/2014

## 2014-05-20 NOTE — Telephone Encounter (Signed)
Called pt to inform her of lab results.  Name and DOB verified, anticardiolipin, beta 2 glycoprotein and lupus anticoagulant drawn on 05/18/14 were normal. Labs verified with Dr. Lisbeth Renshaw.

## 2014-05-25 ENCOUNTER — Other Ambulatory Visit (HOSPITAL_COMMUNITY): Payer: Self-pay

## 2014-05-25 ENCOUNTER — Other Ambulatory Visit (HOSPITAL_COMMUNITY): Payer: Self-pay | Admitting: Maternal and Fetal Medicine

## 2014-05-25 DIAGNOSIS — O3412 Maternal care for benign tumor of corpus uteri, second trimester: Secondary | ICD-10-CM

## 2014-05-25 DIAGNOSIS — D259 Leiomyoma of uterus, unspecified: Secondary | ICD-10-CM

## 2014-05-25 DIAGNOSIS — O09292 Supervision of pregnancy with other poor reproductive or obstetric history, second trimester: Secondary | ICD-10-CM

## 2014-05-26 ENCOUNTER — Encounter: Payer: Self-pay | Admitting: Obstetrics & Gynecology

## 2014-05-27 ENCOUNTER — Other Ambulatory Visit (HOSPITAL_COMMUNITY): Payer: Self-pay

## 2014-05-27 ENCOUNTER — Telehealth (HOSPITAL_COMMUNITY): Payer: Self-pay | Admitting: MS"

## 2014-05-27 NOTE — Telephone Encounter (Signed)
Called [name] regarding results of her peripheral chromosome analysis. Patient identified by name and DOB. This testing was offered given a history of recurrent miscarriage. Discussed with patient that this revealed apparently normal female chromosomes. Reviewed that her partner has option for peripheral blood chromosome analysis, if desired. Patient may contact us if they would like to pursue that testing.   Santiago Glad Delories Mauri 05/27/2014 9:19 AM

## 2014-06-03 ENCOUNTER — Ambulatory Visit (INDEPENDENT_AMBULATORY_CARE_PROVIDER_SITE_OTHER): Payer: No Typology Code available for payment source | Admitting: Family

## 2014-06-03 ENCOUNTER — Other Ambulatory Visit: Payer: Self-pay | Admitting: Family

## 2014-06-03 VITALS — BP 121/54 | HR 88 | Temp 98.6°F | Wt 139.5 lb

## 2014-06-03 DIAGNOSIS — O0990 Supervision of high risk pregnancy, unspecified, unspecified trimester: Secondary | ICD-10-CM | POA: Insufficient documentation

## 2014-06-03 DIAGNOSIS — O0992 Supervision of high risk pregnancy, unspecified, second trimester: Secondary | ICD-10-CM

## 2014-06-03 LAB — POCT URINALYSIS DIP (DEVICE)
Bilirubin Urine: NEGATIVE
Glucose, UA: NEGATIVE mg/dL
Ketones, ur: NEGATIVE mg/dL
Leukocytes, UA: NEGATIVE
Nitrite: NEGATIVE
PH: 5.5 (ref 5.0–8.0)
PROTEIN: NEGATIVE mg/dL
Urobilinogen, UA: 0.2 mg/dL (ref 0.0–1.0)

## 2014-06-03 LAB — WET PREP, GENITAL
TRICH WET PREP: NONE SEEN
YEAST WET PREP: NONE SEEN

## 2014-06-03 NOTE — Progress Notes (Signed)
C/o of "a lot of pain" in cervix. Reports having adhesions and is concerned about how this will affect her pregnancy.  C/o of discharge with odor. Wet prep today.

## 2014-06-03 NOTE — Progress Notes (Signed)
Reports having vaginal odor x 2nd trimester.  GC/CT and wet prep collected.  Cervix - closed,  Reviewed NT results nml.  Ultrasound already scheduled to check cervical length on 06/08/14.  Anatomy ultrasound also scheduled. AFP at next visit.

## 2014-06-04 ENCOUNTER — Encounter: Payer: Self-pay | Admitting: *Deleted

## 2014-06-04 LAB — GC/CHLAMYDIA PROBE AMP
CT Probe RNA: NEGATIVE
GC PROBE AMP APTIMA: NEGATIVE

## 2014-06-08 ENCOUNTER — Encounter (HOSPITAL_COMMUNITY): Payer: Self-pay

## 2014-06-08 ENCOUNTER — Ambulatory Visit (HOSPITAL_COMMUNITY)
Admission: RE | Admit: 2014-06-08 | Discharge: 2014-06-08 | Disposition: A | Discharge status: Home / Self Care | Payer: 59 | Source: Ambulatory Visit | Attending: Obstetrics & Gynecology | Admitting: Obstetrics & Gynecology

## 2014-06-08 VITALS — BP 114/69 | HR 79 | Wt 138.0 lb

## 2014-06-08 DIAGNOSIS — D259 Leiomyoma of uterus, unspecified: Secondary | ICD-10-CM | POA: Diagnosis not present

## 2014-06-08 DIAGNOSIS — O99322 Drug use complicating pregnancy, second trimester: Secondary | ICD-10-CM | POA: Insufficient documentation

## 2014-06-08 DIAGNOSIS — Z36 Encounter for antenatal screening of mother: Secondary | ICD-10-CM | POA: Diagnosis present

## 2014-06-08 DIAGNOSIS — O0992 Supervision of high risk pregnancy, unspecified, second trimester: Secondary | ICD-10-CM

## 2014-06-08 DIAGNOSIS — O09292 Supervision of pregnancy with other poor reproductive or obstetric history, second trimester: Secondary | ICD-10-CM | POA: Diagnosis not present

## 2014-06-08 DIAGNOSIS — F1290 Cannabis use, unspecified, uncomplicated: Secondary | ICD-10-CM | POA: Insufficient documentation

## 2014-06-08 DIAGNOSIS — O3412 Maternal care for benign tumor of corpus uteri, second trimester: Secondary | ICD-10-CM | POA: Diagnosis not present

## 2014-06-08 DIAGNOSIS — Z3A16 16 weeks gestation of pregnancy: Secondary | ICD-10-CM | POA: Diagnosis not present

## 2014-06-08 DIAGNOSIS — O3410 Maternal care for benign tumor of corpus uteri, unspecified trimester: Secondary | ICD-10-CM

## 2014-06-08 DIAGNOSIS — O09299 Supervision of pregnancy with other poor reproductive or obstetric history, unspecified trimester: Secondary | ICD-10-CM | POA: Insufficient documentation

## 2014-06-10 ENCOUNTER — Other Ambulatory Visit: Payer: Self-pay | Admitting: Family

## 2014-06-10 MED ORDER — METRONIDAZOLE 500 MG PO TABS: 500.0000 mg | ORAL_TABLET | Freq: Two times a day (BID) | ORAL | Status: DC

## 2014-06-10 NOTE — Progress Notes (Signed)
Pt called and informed that +clue and RX for flagyl sent to pharmacy.  Pt verbalizes instructions and plans to pick up RX.

## 2014-06-17 ENCOUNTER — Ambulatory Visit (INDEPENDENT_AMBULATORY_CARE_PROVIDER_SITE_OTHER): Payer: 59 | Admitting: Family

## 2014-06-17 VITALS — BP 104/54 | HR 85 | Wt 142.7 lb

## 2014-06-17 DIAGNOSIS — O0992 Supervision of high risk pregnancy, unspecified, second trimester: Secondary | ICD-10-CM

## 2014-06-17 LAB — POCT URINALYSIS DIP (DEVICE)
BILIRUBIN URINE: NEGATIVE
Glucose, UA: NEGATIVE mg/dL
Hgb urine dipstick: NEGATIVE
KETONES UR: NEGATIVE mg/dL
LEUKOCYTES UA: NEGATIVE
Nitrite: NEGATIVE
PH: 5.5 (ref 5.0–8.0)
Protein, ur: NEGATIVE mg/dL
SPECIFIC GRAVITY, URINE: 1.025 (ref 1.005–1.030)
UROBILINOGEN UA: 0.2 mg/dL (ref 0.0–1.0)

## 2014-06-17 MED ORDER — CYCLOBENZAPRINE HCL 10 MG PO TABS: 10.0000 mg | ORAL_TABLET | Freq: Three times a day (TID) | ORAL | Status: DC | PRN

## 2014-06-17 NOTE — Progress Notes (Signed)
Ultrasound scheduled for 06/22/14.   Report increase pain in pelvis and lower back.  Requesting something for the pain.  RX Flexeril sent to pharmacy.  Explained if we give anything in addition to this a pain contract will need to be signed.  AFP collected today.

## 2014-06-21 LAB — ALPHA FETOPROTEIN, MATERNAL
AFP: 46 ng/mL
CURR GEST AGE: 17.3 wks.days
MoM for AFP: 0.97
Open Spina bifida: NEGATIVE

## 2014-06-22 ENCOUNTER — Ambulatory Visit (HOSPITAL_COMMUNITY)
Admission: RE | Admit: 2014-06-22 | Discharge: 2014-06-22 | Disposition: A | Discharge status: Home / Self Care | Payer: 59 | Source: Ambulatory Visit | Attending: Obstetrics & Gynecology | Admitting: Obstetrics & Gynecology

## 2014-06-22 ENCOUNTER — Encounter (HOSPITAL_COMMUNITY): Payer: Self-pay

## 2014-06-22 DIAGNOSIS — O3410 Maternal care for benign tumor of corpus uteri, unspecified trimester: Secondary | ICD-10-CM

## 2014-06-22 DIAGNOSIS — O3412 Maternal care for benign tumor of corpus uteri, second trimester: Secondary | ICD-10-CM | POA: Insufficient documentation

## 2014-06-22 DIAGNOSIS — O09292 Supervision of pregnancy with other poor reproductive or obstetric history, second trimester: Secondary | ICD-10-CM | POA: Insufficient documentation

## 2014-06-22 DIAGNOSIS — Z3A18 18 weeks gestation of pregnancy: Secondary | ICD-10-CM | POA: Insufficient documentation

## 2014-06-22 DIAGNOSIS — Z3689 Encounter for other specified antenatal screening: Secondary | ICD-10-CM | POA: Insufficient documentation

## 2014-06-22 DIAGNOSIS — O09299 Supervision of pregnancy with other poor reproductive or obstetric history, unspecified trimester: Secondary | ICD-10-CM | POA: Insufficient documentation

## 2014-06-22 DIAGNOSIS — D259 Leiomyoma of uterus, unspecified: Secondary | ICD-10-CM

## 2014-06-22 DIAGNOSIS — N96 Recurrent pregnancy loss: Secondary | ICD-10-CM

## 2014-07-06 ENCOUNTER — Encounter (HOSPITAL_COMMUNITY): Payer: Self-pay

## 2014-07-06 ENCOUNTER — Other Ambulatory Visit (HOSPITAL_COMMUNITY): Payer: Self-pay | Admitting: Maternal and Fetal Medicine

## 2014-07-06 ENCOUNTER — Ambulatory Visit (HOSPITAL_COMMUNITY)
Admission: RE | Admit: 2014-07-06 | Discharge: 2014-07-06 | Disposition: A | Discharge status: Home / Self Care | Payer: 59 | Source: Ambulatory Visit | Attending: Obstetrics & Gynecology | Admitting: Obstetrics & Gynecology

## 2014-07-06 DIAGNOSIS — O99322 Drug use complicating pregnancy, second trimester: Secondary | ICD-10-CM | POA: Insufficient documentation

## 2014-07-06 DIAGNOSIS — D259 Leiomyoma of uterus, unspecified: Secondary | ICD-10-CM | POA: Insufficient documentation

## 2014-07-06 DIAGNOSIS — Z36 Encounter for antenatal screening of mother: Secondary | ICD-10-CM | POA: Insufficient documentation

## 2014-07-06 DIAGNOSIS — O09292 Supervision of pregnancy with other poor reproductive or obstetric history, second trimester: Secondary | ICD-10-CM

## 2014-07-06 DIAGNOSIS — N96 Recurrent pregnancy loss: Secondary | ICD-10-CM

## 2014-07-06 DIAGNOSIS — Z3A20 20 weeks gestation of pregnancy: Secondary | ICD-10-CM | POA: Insufficient documentation

## 2014-07-06 DIAGNOSIS — O3412 Maternal care for benign tumor of corpus uteri, second trimester: Secondary | ICD-10-CM | POA: Diagnosis not present

## 2014-07-12 ENCOUNTER — Encounter (HOSPITAL_COMMUNITY): Payer: Self-pay | Admitting: *Deleted

## 2014-07-12 ENCOUNTER — Inpatient Hospital Stay (HOSPITAL_COMMUNITY)
Admission: AD | Admit: 2014-07-12 | Discharge: 2014-07-13 | Disposition: A | Discharge status: Home / Self Care | Payer: 59 | Source: Ambulatory Visit | Attending: Obstetrics and Gynecology | Admitting: Obstetrics and Gynecology

## 2014-07-12 DIAGNOSIS — O26812 Pregnancy related exhaustion and fatigue, second trimester: Secondary | ICD-10-CM | POA: Diagnosis not present

## 2014-07-12 DIAGNOSIS — R05 Cough: Secondary | ICD-10-CM | POA: Insufficient documentation

## 2014-07-12 DIAGNOSIS — O99332 Smoking (tobacco) complicating pregnancy, second trimester: Secondary | ICD-10-CM | POA: Insufficient documentation

## 2014-07-12 DIAGNOSIS — Z3A21 21 weeks gestation of pregnancy: Secondary | ICD-10-CM | POA: Diagnosis not present

## 2014-07-12 DIAGNOSIS — F17210 Nicotine dependence, cigarettes, uncomplicated: Secondary | ICD-10-CM | POA: Insufficient documentation

## 2014-07-12 DIAGNOSIS — O9989 Other specified diseases and conditions complicating pregnancy, childbirth and the puerperium: Secondary | ICD-10-CM | POA: Insufficient documentation

## 2014-07-12 DIAGNOSIS — R69 Illness, unspecified: Secondary | ICD-10-CM

## 2014-07-12 DIAGNOSIS — E860 Dehydration: Secondary | ICD-10-CM | POA: Insufficient documentation

## 2014-07-12 DIAGNOSIS — O0992 Supervision of high risk pregnancy, unspecified, second trimester: Secondary | ICD-10-CM

## 2014-07-12 DIAGNOSIS — R509 Fever, unspecified: Secondary | ICD-10-CM | POA: Diagnosis not present

## 2014-07-12 DIAGNOSIS — J111 Influenza due to unidentified influenza virus with other respiratory manifestations: Secondary | ICD-10-CM

## 2014-07-12 LAB — URINALYSIS, ROUTINE W REFLEX MICROSCOPIC
Bilirubin Urine: NEGATIVE
Glucose, UA: NEGATIVE mg/dL
Hgb urine dipstick: NEGATIVE
Ketones, ur: 15 mg/dL — AB
Leukocytes, UA: NEGATIVE
Nitrite: NEGATIVE
PROTEIN: NEGATIVE mg/dL
Specific Gravity, Urine: 1.025 (ref 1.005–1.030)
Urobilinogen, UA: 0.2 mg/dL (ref 0.0–1.0)
pH: 6.5 (ref 5.0–8.0)

## 2014-07-12 LAB — AMNISURE RUPTURE OF MEMBRANE (ROM) NOT AT ARMC: Amnisure ROM: NEGATIVE

## 2014-07-12 MED ORDER — OSELTAMIVIR PHOSPHATE 75 MG PO CAPS
75.0000 mg | ORAL_CAPSULE | Freq: Once | ORAL | Status: AC
  Administered 2014-07-12: 75 mg via ORAL
  Filled 2014-07-12: qty 1

## 2014-07-12 MED ORDER — OSELTAMIVIR PHOSPHATE 75 MG PO CAPS: 75.0000 mg | ORAL_CAPSULE | Freq: Two times a day (BID) | ORAL | Status: DC

## 2014-07-12 MED ORDER — SODIUM CHLORIDE 0.9 % IV BOLUS (SEPSIS)
1000.0000 mL | Freq: Once | INTRAVENOUS | Status: AC
  Administered 2014-07-13: 1000 mL via INTRAVENOUS

## 2014-07-12 NOTE — MAU Note (Signed)
Pt reports cough, fever, and fever. States her chest feels tight.

## 2014-07-12 NOTE — MAU Provider Note (Signed)
History     CSN: [id number]  Arrival date and time: 07/12/14 2211   First Provider Initiated Contact with Patient 07/12/14 2309      No chief complaint on file.  HPI Pt is a 34 y.o. G4P0300 at [redacted]w[redacted]d who presents with cough, vomiting for the past 2 days. Her cough is productive of mucousy yellow sputum and her vomiting is not post-tussive and looks "yellow and nasty". She reports she has had temps as high as 100.2 as well as chills, HA and muscle aches. She reports multiple sick contacts with similar symptoms but none that have been diagnosed with flu as far as she knows. Denies bleeding. Reports normal FM. Also reports gush of clear fluid coughing earlier this evening as well as some watery leakage for the past two days. She has been able to keep some liquids down but no food for the past 2 days.  OB History    Gravida Para Term Preterm AB TAB SAB Ectopic Multiple Living   4 3 0 3 0 0 0 0 0 0       Past Medical History  Diagnosis Date  . Ovarian cyst   . Abnormal Pap smear   . PID (pelvic inflammatory disease)   . Gonorrhea   . Trichomonas   . Fibroids   . Uterine fibroid 02/15/2014    Past Surgical History  Procedure Laterality Date  . Diagnostic laparoscopy  May 2012  . Laparoscopy N/A 06/26/2012    Procedure: LAPAROSCOPY OPERATIVE with CHROMOPERTUBATION;  Surgeon: Osborne Oman, MD;  Location: [facility];  Service: Gynecology;  Laterality: N/A;  . Salpingoophorectomy Left 06/26/2012    Procedure: SALPINGO OOPHORECTOMY;  Surgeon: Osborne Oman, MD;  Location: [facility];  Service: Gynecology;  Laterality: Left;    Family History  Problem Relation Age of Onset  . Anesthesia problems Neg Hx   . Cystic fibrosis Mother   . Hypertension Mother   . Diabetes Mother     History  Substance Use Topics  . Smoking status: Current Every Day Smoker -- 1.00 packs/day for 10 years    Types: Cigarettes    Last Attempt to Quit: 04/02/2014  . Smokeless tobacco: Never Used  . Alcohol  Use: No    Allergies:  Allergies  Allergen Reactions  . Peanut-Containing Drug Products Anaphylaxis  . Spinach Anaphylaxis    Canned spinach  . Nsaids Hives, Itching and Nausea And Vomiting    All NSAIDs cause this reaction. Benadryl does not help. Ibuprofen also causes personality changes.   . Tramadol Itching and Nausea And Vomiting    Able to take Vicodin, Percocet    Prescriptions prior to admission  Medication Sig Dispense Refill Last Dose  . Prenatal Vit-Fe Fumarate-FA (PRENATAL MULTIVITAMIN) TABS tablet Take 1 tablet by mouth daily at 12 noon.   07/12/2014 at Unknown time    ROS See HPI Physical Exam   Blood pressure 108/52, pulse 102, temperature 99.3 F (37.4 C), temperature source Oral, resp. rate 18, height 5\' 1"  (1.549 m), weight 64.411 kg (142 lb), last menstrual period 12/31/2013, SpO2 100 %.  Physical Exam  Nursing note and vitals reviewed. Constitutional: She is oriented to person, place, and time. She appears well-developed and well-nourished. She appears distressed.  Uncomfortable appearing and uncontrollable coughing throughout exam  HENT:  Head: Normocephalic and atraumatic.  Cardiovascular: Normal rate, regular rhythm, normal heart sounds and intact distal pulses.   No murmur heard. Respiratory: Effort normal and breath sounds normal. No respiratory distress.  She has no wheezes.  GI: Soft. Bowel sounds are normal. She exhibits no distension. There is no tenderness.  Genitourinary: Vagina normal and uterus normal.  Neurological: She is alert and oriented to person, place, and time.  Skin: Skin is warm and dry. No rash noted. She is not diaphoretic.  Psychiatric: She has a normal mood and affect. Her behavior is normal.    MAU Course  Procedures  MDM FHR 148.  Assessment and Plan  Pt presents with flu-like illness x2 days. Will treat presumptively given pregnancy. First dose tamiflu here. Mild dehydration with ketones on urine, will give IVF  bolus.  H/o of multiple fetal demises. Ruled out rupture (negative pool, fern and amnisure) and normal FHR and FM. F/u in clinic tomorrow.  Beverlyn Roux 07/12/2014, 11:14 PM    OB fellow attestation:  I have seen and examined this patient; I agree with above documentation in the resident's note.   [name] [name] is a 34 y.o. [phone] reporting low grade fevers, cough, and general malaise in the last several days. During coughing spell noted leakage from the vagina.  +FM, denies VB, contractions, vaginal discharge.  PE: BP 108/52 mmHg  Pulse 102  Temp(Src) 99.5 F (37.5 C) (Oral)  Resp 18  Ht 5\' 1"  (1.549 m)  Wt 142 lb (64.411 kg)  BMI 26.84 kg/m2  SpO2 100%  LMP 12/31/2013 (Approximate) Gen: calm comfortable, mildly ill appearing, in no acute distress CV: mildly tachycardic, no murmurs  Resp: normal effort, no distress, clear to auscultation bilaterally  Abd: gravid, nontender  ROS, labs, PMH reviewed FHR reassuring  Plan: #Flu Like Illness #Mild dehydration - Tamiflu BID x 5 days. First dose in MAU.  - IV fluid bolus - Antiemetic - PO challenge  #Rule out ROM - Neg pooling, Neg ferning - Neg amnisure   #IUP @ 21 weeks - Not in labor - Preterm labor precautions reviewed - Follow up in OB clinic within one week.   [name] Hutching, MD 2:16 AM

## 2014-07-12 NOTE — MAU Note (Signed)
PO ICE CHIPS  

## 2014-07-12 NOTE — Discharge Instructions (Signed)
Pregnancy and Influenza Influenza, also called the flu, is an infection of the respiratory tract. If you are pregnant, you are more likely to catch the flu. You are also more likely to have a more serious case of the flu. This is because pregnancy lowers your body's ability to fight off infections (it weakens your immune system). It also puts additional stress on your heart and lungs, which makes you more likely to have complications. Having a bad case of the flu, especially with a high fever, can be dangerous for your developing baby. It can cause you to go into early labor. HOW DO PEOPLE GET THE FLU? The flu is caused by the influenza virus. This virus is common every year in the fall and winter. It spreads when virus particles get passed from person to person. You can get the virus if you are near a sick person who is coughing or sneezing. You can also get the virus if you touch something that has the virus on it and then touch your face. HOW CAN I PROTECT MYSELF AGAINST THE FLU?  Get a flu shot. The best way to prevent the flu is to get a flu shot before flu season starts. The flu shot is not dangerous for your developing baby. It may even help protect your baby from the flu for up to 6 months after birth. The flu shot is one type of flu vaccine. Another type is a nasal spray vaccine. Do not get the nasal spray vaccine. It is not approved for pregnancy.  Do not come in close contact with sick people.  Do not share food, drinks, or utensils with other people.  Wash your hands often. Use hand sanitizer when soap and water are not available. WHAT SHOULD I DO IF I HAVE FLU SYMPTOMS? If you have any flu symptoms, call your health care provider right away. Flu symptoms include:  Fever or chills.  Muscle aches.  Headache.  Sore throat.  Nasal congestion.  Cough.  Feeling tired.  Loss of appetite.  Vomiting.  Diarrhea. You may be able to take an antiviral medicine to keep the flu from  becoming severe and to shorten how long it lasts. WHAT SHOULD I DO AT HOME IF I AM DIAGNOSED WITH THE FLU?  Do not take any medicine, including cold or flu medicine, unless directed by your health care provider.  If you take antiviral medicine, make sure you finish it even if you start to feel better.  Drink enough fluid to keep your urine clear or pale yellow.  Get plenty of rest. [place] CARE IF I HAVE THE FLU?  You have trouble breathing.  You have chest pain.  You begin to have labor pains.  You have a high fever that does not go down after you take medicine.  You do not feel your baby move.  You have diarrhea or vomiting that will not go away. Document Released: 01/20/2008 Document Revised: 03/24/2013 Document Reviewed: 02/13/2013 [facility] Patient Information 2015 [place], [place]. This information is not intended to replace advice given to you by your health care provider. Make sure you discuss any questions you have with your health care provider.

## 2014-07-12 NOTE — MAU Note (Signed)
PT SAYS SHE STARTED  VOMITING  THIS AM.  TEMP  TODAY  100.2-  TOOK ROBITUSSIN.     COUGH-  STARTED  YESTERDAY-  PROD  COUGH.     GETS  [facility] -  IN CLINIC -  LAST SEEN -   3-22.   NEXT  APPOINTMENT -   Tuesday-  350PM.

## 2014-07-13 ENCOUNTER — Encounter: Payer: 59 | Admitting: Obstetrics & Gynecology

## 2014-07-13 MED ORDER — PROMETHAZINE HCL 25 MG/ML IJ SOLN
25.0000 mg | Freq: Once | INTRAMUSCULAR | Status: AC
  Administered 2014-07-13: 25 mg via INTRAVENOUS
  Filled 2014-07-13: qty 1

## 2014-07-13 MED ORDER — PROMETHAZINE HCL 12.5 MG PO TABS: 12.5000 mg | ORAL_TABLET | Freq: Four times a day (QID) | ORAL | Status: DC | PRN

## 2014-07-21 ENCOUNTER — Encounter (HOSPITAL_COMMUNITY): Payer: Self-pay

## 2014-07-21 ENCOUNTER — Ambulatory Visit (HOSPITAL_COMMUNITY)
Admission: RE | Admit: 2014-07-21 | Discharge: 2014-07-21 | Disposition: A | Discharge status: Home / Self Care | Payer: 59 | Source: Ambulatory Visit | Attending: Obstetrics & Gynecology | Admitting: Obstetrics & Gynecology

## 2014-07-21 DIAGNOSIS — D259 Leiomyoma of uterus, unspecified: Secondary | ICD-10-CM

## 2014-07-21 DIAGNOSIS — O09212 Supervision of pregnancy with history of pre-term labor, second trimester: Secondary | ICD-10-CM | POA: Diagnosis not present

## 2014-07-21 DIAGNOSIS — N96 Recurrent pregnancy loss: Secondary | ICD-10-CM

## 2014-07-21 DIAGNOSIS — Z3A22 22 weeks gestation of pregnancy: Secondary | ICD-10-CM | POA: Diagnosis not present

## 2014-07-21 DIAGNOSIS — O09292 Supervision of pregnancy with other poor reproductive or obstetric history, second trimester: Secondary | ICD-10-CM

## 2014-07-21 DIAGNOSIS — O3412 Maternal care for benign tumor of corpus uteri, second trimester: Secondary | ICD-10-CM | POA: Insufficient documentation

## 2014-07-21 DIAGNOSIS — Z36 Encounter for antenatal screening of mother: Secondary | ICD-10-CM | POA: Insufficient documentation

## 2014-07-22 ENCOUNTER — Ambulatory Visit (INDEPENDENT_AMBULATORY_CARE_PROVIDER_SITE_OTHER): Payer: 59 | Admitting: Family Medicine

## 2014-07-22 ENCOUNTER — Encounter: Payer: Self-pay | Admitting: Obstetrics & Gynecology

## 2014-07-22 ENCOUNTER — Encounter: Payer: Self-pay | Admitting: Family Medicine

## 2014-07-22 VITALS — BP 112/58 | HR 83 | Wt 144.0 lb

## 2014-07-22 DIAGNOSIS — O0992 Supervision of high risk pregnancy, unspecified, second trimester: Secondary | ICD-10-CM

## 2014-07-22 DIAGNOSIS — O09292 Supervision of pregnancy with other poor reproductive or obstetric history, second trimester: Secondary | ICD-10-CM

## 2014-07-22 LAB — POCT URINALYSIS DIP (DEVICE)
Bilirubin Urine: NEGATIVE
Glucose, UA: NEGATIVE mg/dL
Hgb urine dipstick: NEGATIVE
KETONES UR: NEGATIVE mg/dL
Leukocytes, UA: NEGATIVE
Nitrite: NEGATIVE
PH: 6 (ref 5.0–8.0)
PROTEIN: 30 mg/dL — AB
Urobilinogen, UA: 0.2 mg/dL (ref 0.0–1.0)

## 2014-07-22 NOTE — Patient Instructions (Signed)
Second Trimester of Pregnancy The second trimester is from week 13 through week 28, months 4 through 6. The second trimester is often a time when you feel your best. Your body has also adjusted to being pregnant, and you begin to feel better physically. Usually, morning sickness has lessened or quit completely, you may have more energy, and you may have an increase in appetite. The second trimester is also a time when the fetus is growing rapidly. At the end of the sixth month, the fetus is about 9 inches long and weighs about 1 pounds. You will likely begin to feel the baby move (quickening) between 18 and 20 weeks of the pregnancy. BODY CHANGES Your body goes through many changes during pregnancy. The changes vary from woman to woman.   Your weight will continue to increase. You will notice your lower abdomen bulging out.  You may begin to get stretch marks on your hips, abdomen, and breasts.  You may develop headaches that can be relieved by medicines approved by your health care provider.  You may urinate more often because the fetus is pressing on your bladder.  You may develop or continue to have heartburn as a result of your pregnancy.  You may develop constipation because certain hormones are causing the muscles that push waste through your intestines to slow down.  You may develop hemorrhoids or swollen, bulging veins (varicose veins).  You may have back pain because of the weight gain and pregnancy hormones relaxing your joints between the bones in your pelvis and as a result of a shift in weight and the muscles that support your balance.  Your breasts will continue to grow and be tender.  Your gums may bleed and may be sensitive to brushing and flossing.  Dark spots or blotches (chloasma, mask of pregnancy) may develop on your face. This will likely fade after the baby is born.  A dark line from your belly button to the pubic area (linea nigra) may appear. This will likely  fade after the baby is born.  You may have changes in your hair. These can include thickening of your hair, rapid growth, and changes in texture. Some women also have hair loss during or after pregnancy, or hair that feels dry or thin. Your hair will most likely return to normal after your baby is born. WHAT TO EXPECT AT YOUR PRENATAL VISITS During a routine prenatal visit:  You will be weighed to make sure you and the fetus are growing normally.  Your blood pressure will be taken.  Your abdomen will be measured to track your baby's growth.  The fetal heartbeat will be listened to.  Any test results from the previous visit will be discussed. Your health care provider may ask you:  How you are feeling.  If you are feeling the baby move.  If you have had any abnormal symptoms, such as leaking fluid, bleeding, severe headaches, or abdominal cramping.  If you have any questions. Other tests that may be performed during your second trimester include:  Blood tests that check for:  Low iron levels (anemia).  Gestational diabetes (between 24 and 28 weeks).  Rh antibodies.  Urine tests to check for infections, diabetes, or protein in the urine.  An ultrasound to confirm the proper growth and development of the baby.  An amniocentesis to check for possible genetic problems.  Fetal screens for spina bifida and Down syndrome. HOME CARE INSTRUCTIONS   Avoid all smoking, herbs, alcohol, and unprescribed   drugs. These chemicals affect the formation and growth of the baby.  Follow your health care provider's instructions regarding medicine use. There are medicines that are either safe or unsafe to take during pregnancy.  Exercise only as directed by your health care provider. Experiencing uterine cramps is a good sign to stop exercising.  Continue to eat regular, healthy meals.  Wear a good support bra for breast tenderness.  Do not use hot tubs, steam rooms, or saunas.  Wear  your seat belt at all times when driving.  Avoid raw meat, uncooked cheese, cat litter boxes, and soil used by cats. These carry germs that can cause birth defects in the baby.  Take your prenatal vitamins.  Try taking a stool softener (if your health care provider approves) if you develop constipation. Eat more high-fiber foods, such as fresh vegetables or fruit and whole grains. Drink plenty of fluids to keep your urine clear or pale yellow.  Take warm sitz baths to soothe any pain or discomfort caused by hemorrhoids. Use hemorrhoid cream if your health care provider approves.  If you develop varicose veins, wear support hose. Elevate your feet for 15 minutes, 3-4 times a day. Limit salt in your diet.  Avoid heavy lifting, wear low heel shoes, and practice good posture.  Rest with your legs elevated if you have leg cramps or low back pain.  Visit your dentist if you have not gone yet during your pregnancy. Use a soft toothbrush to brush your teeth and be gentle when you floss.  A sexual relationship may be continued unless your health care provider directs you otherwise.  Continue to go to all your prenatal visits as directed by your health care provider. SEEK MEDICAL CARE IF:   You have dizziness.  You have mild pelvic cramps, pelvic pressure, or nagging pain in the abdominal area.  You have persistent nausea, vomiting, or diarrhea.  You have a bad smelling vaginal discharge.  You have pain with urination. SEEK IMMEDIATE MEDICAL CARE IF:   You have a fever.  You are leaking fluid from your vagina.  You have spotting or bleeding from your vagina.  You have severe abdominal cramping or pain.  You have rapid weight gain or loss.  You have shortness of breath with chest pain.  You notice sudden or extreme swelling of your face, hands, ankles, feet, or legs.  You have not felt your baby move in over an hour.  You have severe headaches that do not go away with  medicine.  You have vision changes. Document Released: 03/13/2001 Document Revised: 03/24/2013 Document Reviewed: 05/20/2012 [employer] Patient Information 2015 [employer]. This information is not intended to replace advice given to you by your health care provider. Make sure you discuss any questions you have with your health care provider.  Breastfeeding Deciding to breastfeed is one of the best choices you can make for you and your baby. A change in hormones during pregnancy causes your breast tissue to grow and increases the number and size of your milk ducts. These hormones also allow proteins, sugars, and fats from your blood supply to make breast milk in your milk-producing glands. Hormones prevent breast milk from being released before your baby is born as well as prompt milk flow after birth. Once breastfeeding has begun, thoughts of your baby, as well as his or her sucking or crying, can stimulate the release of milk from your milk-producing glands.  BENEFITS OF BREASTFEEDING For Your Baby  Your first   milk (colostrum) helps your baby's digestive system function better.   There are antibodies in your milk that help your baby fight off infections.   Your baby has a lower incidence of asthma, allergies, and sudden infant death syndrome.   The nutrients in breast milk are better for your baby than infant formulas and are designed uniquely for your baby's needs.   Breast milk improves your baby's brain development.   Your baby is less likely to develop other conditions, such as childhood obesity, asthma, or type 2 diabetes mellitus.  For You   Breastfeeding helps to create a very special bond between you and your baby.   Breastfeeding is convenient. Breast milk is always available at the correct temperature and costs nothing.   Breastfeeding helps to burn calories and helps you lose the weight gained during pregnancy.   Breastfeeding makes your uterus contract to its  prepregnancy size faster and slows bleeding (lochia) after you give birth.   Breastfeeding helps to lower your risk of developing type 2 diabetes mellitus, osteoporosis, and breast or ovarian cancer later in life. SIGNS THAT YOUR BABY IS HUNGRY Early Signs of Hunger  Increased alertness or activity.  Stretching.  Movement of the head from side to side.  Movement of the head and opening of the mouth when the corner of the mouth or cheek is stroked (rooting).  Increased sucking sounds, smacking lips, cooing, sighing, or squeaking.  Hand-to-mouth movements.  Increased sucking of fingers or hands. Late Signs of Hunger  Fussing.  Intermittent crying. Extreme Signs of Hunger Signs of extreme hunger will require calming and consoling before your baby will be able to breastfeed successfully. Do not wait for the following signs of extreme hunger to occur before you initiate breastfeeding:   Restlessness.  A loud, strong cry.   Screaming. BREASTFEEDING BASICS Breastfeeding Initiation  Find a comfortable place to sit or lie down, with your neck and back well supported.  Place a pillow or rolled up blanket under your baby to bring him or her to the level of your breast (if you are seated). Nursing pillows are specially designed to help support your arms and your baby while you breastfeed.  Make sure that your baby's abdomen is facing your abdomen.   Gently massage your breast. With your fingertips, massage from your chest wall toward your nipple in a circular motion. This encourages milk flow. You may need to continue this action during the feeding if your milk flows slowly.  Support your breast with 4 fingers underneath and your thumb above your nipple. Make sure your fingers are well away from your nipple and your baby's mouth.   Stroke your baby's lips gently with your finger or nipple.   When your baby's mouth is open wide enough, quickly bring your baby to your breast,  placing your entire nipple and as much of the colored area around your nipple (areola) as possible into your baby's mouth.   More areola should be visible above your baby's upper lip than below the lower lip.   Your baby's tongue should be between his or her lower gum and your breast.   Ensure that your baby's mouth is correctly positioned around your nipple (latched). Your baby's lips should create a seal on your breast and be turned out (everted).  It is common for your baby to suck about 2-3 minutes in order to start the flow of breast milk. Latching Teaching your baby how to latch on to your breast   properly is very important. An improper latch can cause nipple pain and decreased milk supply for you and poor weight gain in your baby. Also, if your baby is not latched onto your nipple properly, he or she may swallow some air during feeding. This can make your baby fussy. Burping your baby when you switch breasts during the feeding can help to get rid of the air. However, teaching your baby to latch on properly is still the best way to prevent fussiness from swallowing air while breastfeeding. Signs that your baby has successfully latched on to your nipple:    Silent tugging or silent sucking, without causing you pain.   Swallowing heard between every 3-4 sucks.    Muscle movement above and in front of his or her ears while sucking.  Signs that your baby has not successfully latched on to nipple:   Sucking sounds or smacking sounds from your baby while breastfeeding.  Nipple pain. If you think your baby has not latched on correctly, slip your finger into the corner of your baby's mouth to break the suction and place it between your baby's gums. Attempt breastfeeding initiation again. Signs of Successful Breastfeeding Signs from your baby:   A gradual decrease in the number of sucks or complete cessation of sucking.   Falling asleep.   Relaxation of his or her body.    Retention of a small amount of milk in his or her mouth.   Letting go of your breast by himself or herself. Signs from you:  Breasts that have increased in firmness, weight, and size 1-3 hours after feeding.   Breasts that are softer immediately after breastfeeding.  Increased milk volume, as well as a change in milk consistency and color by the fifth day of breastfeeding.   Nipples that are not sore, cracked, or bleeding. Signs That Your Baby is Getting Enough Milk  Wetting at least 3 diapers in a 24-hour period. The urine should be clear and pale yellow by age 5 days.  At least 3 stools in a 24-hour period by age 5 days. The stool should be soft and yellow.  At least 3 stools in a 24-hour period by age 7 days. The stool should be seedy and yellow.  No loss of weight greater than 10% of birth weight during the first 3 days of age.  Average weight gain of 4-7 ounces (113-198 g) per week after age 4 days.  Consistent daily weight gain by age 5 days, without weight loss after the age of 2 weeks. After a feeding, your baby may spit up a small amount. This is common. BREASTFEEDING FREQUENCY AND DURATION Frequent feeding will help you make more milk and can prevent sore nipples and breast engorgement. Breastfeed when you feel the need to reduce the fullness of your breasts or when your baby shows signs of hunger. This is called "breastfeeding on demand." Avoid introducing a pacifier to your baby while you are working to establish breastfeeding (the first 4-6 weeks after your baby is born). After this time you may choose to use a pacifier. Research has shown that pacifier use during the first year of a baby's life decreases the risk of sudden infant death syndrome (SIDS). Allow your baby to feed on each breast as long as he or she wants. Breastfeed until your baby is finished feeding. When your baby unlatches or falls asleep while feeding from the first breast, offer the second breast.  Because newborns are often sleepy in the   first few weeks of life, you may need to awaken your baby to get him or her to feed. Breastfeeding times will vary from baby to baby. However, the following rules can serve as a guide to help you ensure that your baby is properly fed:  Newborns (babies 4 weeks of age or younger) may breastfeed every 1-3 hours.  Newborns should not go longer than 3 hours during the day or 5 hours during the night without breastfeeding.  You should breastfeed your baby a minimum of 8 times in a 24-hour period until you begin to introduce solid foods to your baby at around 6 months of age. BREAST MILK PUMPING Pumping and storing breast milk allows you to ensure that your baby is exclusively fed your breast milk, even at times when you are unable to breastfeed. This is especially important if you are going back to work while you are still breastfeeding or when you are not able to be present during feedings. Your lactation consultant can give you guidelines on how long it is safe to store breast milk.  A breast pump is a machine that allows you to pump milk from your breast into a sterile bottle. The pumped breast milk can then be stored in a refrigerator or freezer. Some breast pumps are operated by hand, while others use electricity. Ask your lactation consultant which type will work best for you. Breast pumps can be purchased, but some hospitals and breastfeeding support groups lease breast pumps on a monthly basis. A lactation consultant can teach you how to hand express breast milk, if you prefer not to use a pump.  CARING FOR YOUR BREASTS WHILE YOU BREASTFEED Nipples can become dry, cracked, and sore while breastfeeding. The following recommendations can help keep your breasts moisturized and healthy:  Avoid using soap on your nipples.   Wear a supportive bra. Although not required, special nursing bras and tank tops are designed to allow access to your breasts for  breastfeeding without taking off your entire bra or top. Avoid wearing underwire-style bras or extremely tight bras.  Air dry your nipples for 3-4minutes after each feeding.   Use only cotton bra pads to absorb leaked breast milk. Leaking of breast milk between feedings is normal.   Use lanolin on your nipples after breastfeeding. Lanolin helps to maintain your skin's normal moisture barrier. If you use pure lanolin, you do not need to wash it off before feeding your baby again. Pure lanolin is not toxic to your baby. You may also hand express a few drops of breast milk and gently massage that milk into your nipples and allow the milk to air dry. In the first few weeks after giving birth, some women experience extremely full breasts (engorgement). Engorgement can make your breasts feel heavy, warm, and tender to the touch. Engorgement peaks within 3-5 days after you give birth. The following recommendations can help ease engorgement:  Completely empty your breasts while breastfeeding or pumping. You may want to start by applying warm, moist heat (in the shower or with warm water-soaked hand towels) just before feeding or pumping. This increases circulation and helps the milk flow. If your baby does not completely empty your breasts while breastfeeding, pump any extra milk after he or she is finished.  Wear a snug bra (nursing or regular) or tank top for 1-2 days to signal your body to slightly decrease milk production.  Apply ice packs to your breasts, unless this is too uncomfortable for you.    Make sure that your baby is latched on and positioned properly while breastfeeding. If engorgement persists after 48 hours of following these recommendations, contact your health care provider or a lactation consultant. OVERALL HEALTH CARE RECOMMENDATIONS WHILE BREASTFEEDING  Eat healthy foods. Alternate between meals and snacks, eating 3 of each per day. Because what you eat affects your breast milk,  some of the foods may make your baby more irritable than usual. Avoid eating these foods if you are sure that they are negatively affecting your baby.  Drink milk, fruit juice, and water to satisfy your thirst (about 10 glasses a day).   Rest often, relax, and continue to take your prenatal vitamins to prevent fatigue, stress, and anemia.  Continue breast self-awareness checks.  Avoid chewing and smoking tobacco.  Avoid alcohol and drug use. Some medicines that may be harmful to your baby can pass through breast milk. It is important to ask your health care provider before taking any medicine, including all over-the-counter and prescription medicine as well as vitamin and herbal supplements. It is possible to become pregnant while breastfeeding. If birth control is desired, ask your health care provider about options that will be safe for your baby. SEEK MEDICAL CARE IF:   You feel like you want to stop breastfeeding or have become frustrated with breastfeeding.  You have painful breasts or nipples.  Your nipples are cracked or bleeding.  Your breasts are red, tender, or warm.  You have a swollen area on either breast.  You have a fever or chills.  You have nausea or vomiting.  You have drainage other than breast milk from your nipples.  Your breasts do not become full before feedings by the fifth day after you give birth.  You feel sad and depressed.  Your baby is too sleepy to eat well.  Your baby is having trouble sleeping.   Your baby is wetting less than 3 diapers in a 24-hour period.  Your baby has less than 3 stools in a 24-hour period.  Your baby's skin or the white part of his or her eyes becomes yellow.   Your baby is not gaining weight by 5 days of age. SEEK IMMEDIATE MEDICAL CARE IF:   Your baby is overly tired (lethargic) and does not want to wake up and feed.  Your baby develops an unexplained fever. Document Released: 03/19/2005 Document Revised:  03/24/2013 Document Reviewed: 09/10/2012 [employer] Patient Information 2015 [employer]. This information is not intended to replace advice given to you by your health care provider. Make sure you discuss any questions you have with your health care provider.  

## 2014-07-22 NOTE — Progress Notes (Signed)
Scheduled for f/u u/s Doing well. U/S 4 20 shows normally grown fetus, stable uterine myomas, normal cervical length

## 2014-08-04 ENCOUNTER — Ambulatory Visit (HOSPITAL_COMMUNITY)
Admission: RE | Admit: 2014-08-04 | Discharge: 2014-08-04 | Disposition: A | Discharge status: Home / Self Care | Payer: 59 | Source: Ambulatory Visit | Attending: Obstetrics & Gynecology | Admitting: Obstetrics & Gynecology

## 2014-08-04 ENCOUNTER — Other Ambulatory Visit (HOSPITAL_COMMUNITY): Payer: Self-pay | Admitting: Maternal and Fetal Medicine

## 2014-08-04 DIAGNOSIS — F17210 Nicotine dependence, cigarettes, uncomplicated: Secondary | ICD-10-CM | POA: Diagnosis present

## 2014-08-04 DIAGNOSIS — N96 Recurrent pregnancy loss: Secondary | ICD-10-CM

## 2014-08-04 DIAGNOSIS — D259 Leiomyoma of uterus, unspecified: Secondary | ICD-10-CM | POA: Insufficient documentation

## 2014-08-04 DIAGNOSIS — O09219 Supervision of pregnancy with history of pre-term labor, unspecified trimester: Secondary | ICD-10-CM | POA: Insufficient documentation

## 2014-08-04 DIAGNOSIS — O09292 Supervision of pregnancy with other poor reproductive or obstetric history, second trimester: Secondary | ICD-10-CM

## 2014-08-04 DIAGNOSIS — O09212 Supervision of pregnancy with history of pre-term labor, second trimester: Secondary | ICD-10-CM

## 2014-08-04 DIAGNOSIS — Z36 Encounter for antenatal screening of mother: Secondary | ICD-10-CM | POA: Insufficient documentation

## 2014-08-04 DIAGNOSIS — O3412 Maternal care for benign tumor of corpus uteri, second trimester: Secondary | ICD-10-CM

## 2014-08-04 DIAGNOSIS — Z3A24 24 weeks gestation of pregnancy: Secondary | ICD-10-CM | POA: Diagnosis present

## 2014-08-04 DIAGNOSIS — O99323 Drug use complicating pregnancy, third trimester: Secondary | ICD-10-CM

## 2014-08-04 DIAGNOSIS — Z833 Family history of diabetes mellitus: Secondary | ICD-10-CM

## 2014-08-04 DIAGNOSIS — O09299 Supervision of pregnancy with other poor reproductive or obstetric history, unspecified trimester: Secondary | ICD-10-CM

## 2014-08-04 DIAGNOSIS — Z8249 Family history of ischemic heart disease and other diseases of the circulatory system: Secondary | ICD-10-CM

## 2014-08-04 DIAGNOSIS — O99332 Smoking (tobacco) complicating pregnancy, second trimester: Secondary | ICD-10-CM | POA: Diagnosis present

## 2014-08-04 NOTE — ED Notes (Signed)
Pt states she fell this morning but caught herself on her hands.  She did not hit her abdomen.

## 2014-08-06 ENCOUNTER — Inpatient Hospital Stay (HOSPITAL_COMMUNITY)
Admission: AD | Admit: 2014-08-06 | Discharge: 2014-08-07 | DRG: 778 | Disposition: A | Discharge status: Home / Self Care | Payer: 59 | Source: Ambulatory Visit | Attending: Family Medicine | Admitting: Family Medicine

## 2014-08-06 ENCOUNTER — Inpatient Hospital Stay (HOSPITAL_COMMUNITY): Payer: 59

## 2014-08-06 ENCOUNTER — Encounter (HOSPITAL_COMMUNITY): Payer: Self-pay

## 2014-08-06 DIAGNOSIS — O4702 False labor before 37 completed weeks of gestation, second trimester: Secondary | ICD-10-CM | POA: Diagnosis not present

## 2014-08-06 DIAGNOSIS — R109 Unspecified abdominal pain: Secondary | ICD-10-CM

## 2014-08-06 DIAGNOSIS — Z3A24 24 weeks gestation of pregnancy: Secondary | ICD-10-CM | POA: Diagnosis present

## 2014-08-06 DIAGNOSIS — Z8249 Family history of ischemic heart disease and other diseases of the circulatory system: Secondary | ICD-10-CM | POA: Diagnosis not present

## 2014-08-06 DIAGNOSIS — O99332 Smoking (tobacco) complicating pregnancy, second trimester: Secondary | ICD-10-CM | POA: Diagnosis present

## 2014-08-06 DIAGNOSIS — F17210 Nicotine dependence, cigarettes, uncomplicated: Secondary | ICD-10-CM | POA: Diagnosis present

## 2014-08-06 DIAGNOSIS — Z331 Pregnant state, incidental: Secondary | ICD-10-CM | POA: Diagnosis not present

## 2014-08-06 DIAGNOSIS — Z833 Family history of diabetes mellitus: Secondary | ICD-10-CM | POA: Diagnosis not present

## 2014-08-06 DIAGNOSIS — O0992 Supervision of high risk pregnancy, unspecified, second trimester: Secondary | ICD-10-CM

## 2014-08-06 DIAGNOSIS — O09292 Supervision of pregnancy with other poor reproductive or obstetric history, second trimester: Secondary | ICD-10-CM

## 2014-08-06 DIAGNOSIS — O0990 Supervision of high risk pregnancy, unspecified, unspecified trimester: Secondary | ICD-10-CM

## 2014-08-06 DIAGNOSIS — O4700 False labor before 37 completed weeks of gestation, unspecified trimester: Secondary | ICD-10-CM | POA: Diagnosis present

## 2014-08-06 DIAGNOSIS — O26899 Other specified pregnancy related conditions, unspecified trimester: Secondary | ICD-10-CM

## 2014-08-06 DIAGNOSIS — O09299 Supervision of pregnancy with other poor reproductive or obstetric history, unspecified trimester: Secondary | ICD-10-CM

## 2014-08-06 LAB — URINALYSIS, ROUTINE W REFLEX MICROSCOPIC
Bilirubin Urine: NEGATIVE
Glucose, UA: NEGATIVE mg/dL
Hgb urine dipstick: NEGATIVE
Ketones, ur: 15 mg/dL — AB
Leukocytes, UA: NEGATIVE
NITRITE: NEGATIVE
Protein, ur: 30 mg/dL — AB
SPECIFIC GRAVITY, URINE: 1.015 (ref 1.005–1.030)
Urobilinogen, UA: 0.2 mg/dL (ref 0.0–1.0)
pH: 8.5 — ABNORMAL HIGH (ref 5.0–8.0)

## 2014-08-06 LAB — URINE MICROSCOPIC-ADD ON

## 2014-08-06 LAB — CBC
HCT: 30.2 % — ABNORMAL LOW (ref 36.0–46.0)
Hemoglobin: 10.3 g/dL — ABNORMAL LOW (ref 12.0–15.0)
MCH: 28.1 pg (ref 26.0–34.0)
MCHC: 34.1 g/dL (ref 30.0–36.0)
MCV: 82.5 fL (ref 78.0–100.0)
Platelets: 244 10*3/uL (ref 150–400)
RBC: 3.66 MIL/uL — AB (ref 3.87–5.11)
RDW: 13.7 % (ref 11.5–15.5)
WBC: 9.3 10*3/uL (ref 4.0–10.5)

## 2014-08-06 LAB — FETAL FIBRONECTIN: Fetal Fibronectin: NEGATIVE

## 2014-08-06 LAB — TYPE AND SCREEN
ABO/RH(D): O POS
Antibody Screen: NEGATIVE

## 2014-08-06 MED ORDER — LACTATED RINGERS IV BOLUS (SEPSIS)
1000.0000 mL | Freq: Once | INTRAVENOUS | Status: AC
Start: 2014-08-06 — End: 2014-08-06
  Administered 2014-08-06: 1000 mL via INTRAVENOUS

## 2014-08-06 MED ORDER — ZOLPIDEM TARTRATE 5 MG PO TABS: 5.0000 mg | ORAL_TABLET | Freq: Every evening | ORAL | Status: DC | PRN

## 2014-08-06 MED ORDER — PROMETHAZINE HCL 25 MG PO TABS: 12.5000 mg | ORAL_TABLET | Freq: Four times a day (QID) | ORAL | Status: DC | PRN

## 2014-08-06 MED ORDER — NIFEDIPINE 10 MG PO CAPS
20.0000 mg | ORAL_CAPSULE | Freq: Once | ORAL | Status: AC
  Administered 2014-08-06: 20 mg via ORAL
  Filled 2014-08-06: qty 2

## 2014-08-06 MED ORDER — NIFEDIPINE 10 MG PO CAPS
10.0000 mg | ORAL_CAPSULE | Freq: Once | ORAL | Status: AC
  Administered 2014-08-06: 10 mg via ORAL
  Filled 2014-08-06: qty 1

## 2014-08-06 MED ORDER — LACTATED RINGERS IV SOLN
INTRAVENOUS | Status: DC
  Administered 2014-08-06 – 2014-08-07 (×3): via INTRAVENOUS

## 2014-08-06 MED ORDER — DOCUSATE SODIUM 100 MG PO CAPS
100.0000 mg | ORAL_CAPSULE | Freq: Every day | ORAL | Status: DC
  Administered 2014-08-07: 100 mg via ORAL
  Filled 2014-08-06: qty 1

## 2014-08-06 MED ORDER — PRENATAL MULTIVITAMIN CH: 1.0000 | ORAL_TABLET | Freq: Every day | ORAL | Status: DC

## 2014-08-06 MED ORDER — ACETAMINOPHEN 325 MG PO TABS
650.0000 mg | ORAL_TABLET | ORAL | Status: DC | PRN
  Administered 2014-08-07: 650 mg via ORAL
  Filled 2014-08-06: qty 2

## 2014-08-06 MED ORDER — PROMETHAZINE HCL 25 MG/ML IJ SOLN
25.0000 mg | Freq: Once | INTRAMUSCULAR | Status: AC
  Administered 2014-08-06: 25 mg via INTRAVENOUS
  Filled 2014-08-06: qty 1

## 2014-08-06 MED ORDER — DOCUSATE SODIUM 100 MG PO CAPS: 100.0000 mg | ORAL_CAPSULE | Freq: Every day | ORAL | Status: DC

## 2014-08-06 MED ORDER — PRENATAL MULTIVITAMIN CH
1.0000 | ORAL_TABLET | Freq: Every day | ORAL | Status: DC
Start: 2014-08-07 — End: 2014-08-07
  Administered 2014-08-07: 1 via ORAL
  Filled 2014-08-06: qty 1

## 2014-08-06 MED ORDER — BETAMETHASONE SOD PHOS & ACET 6 (3-3) MG/ML IJ SUSP
12.0000 mg | INTRAMUSCULAR | Status: AC
  Administered 2014-08-06 – 2014-08-07 (×2): 12 mg via INTRAMUSCULAR
  Filled 2014-08-06 (×2): qty 2

## 2014-08-06 MED ORDER — FENTANYL CITRATE (PF) 100 MCG/2ML IJ SOLN
100.0000 ug | Freq: Once | INTRAMUSCULAR | Status: AC
  Administered 2014-08-06: 100 ug via INTRAVENOUS
  Filled 2014-08-06: qty 2

## 2014-08-06 MED ORDER — CALCIUM CARBONATE ANTACID 500 MG PO CHEW: 2.0000 | CHEWABLE_TABLET | ORAL | Status: DC | PRN

## 2014-08-06 NOTE — H&P (Signed)
[facility]ulty [facility] Antenatal History and Physical  LAIKLYNN RACZYNSKI ZVJ:[id number] DOB: 1980-06-21 DOA: 08/06/2014  Chief Complaint:Contractions.  First Provider Initiated Contact with Patient 08/06/14 1624    HPI: BRIENA SWINGLER is a 34 y.o. G4P0300 at 63w4dwho presents to maternity admissions reporting contractions described as tightening and cramping intermittently since 2 am, worsening over the course of the day. She is breathing through contractions upon arrival in MAU. She has poor obstetric hx with fetal demise x 3, related to abruption. She reports good fetal movement, denies LOF, vaginal bleeding, vaginal itching/burning, urinary symptoms, h/a, dizziness, n/v, or fever/chills.   HPI  Past Medical History: Past Medical History  Diagnosis Date  . Ovarian cyst   . Abnormal Pap smear   . PID (pelvic inflammatory disease)   . Gonorrhea   . Trichomonas   . Fibroids   . Uterine fibroid 02/15/2014    Past obstetric history: OB History  Gravida Para Term Preterm AB SAB TAB Ectopic Multiple Living  4 3 0 3 0 0 0 0 0 0     # Outcome Date GA Lbr Len/2nd Weight Sex Delivery Anes PTL Lv  4 Current           3 Preterm 04/02/00 [redacted]w[redacted]d    Vag-Spont  N FD  2 Preterm 04/02/98 [redacted]w[redacted]d    Vag-Spont  N FD   Complications: Abruptio Placenta  1 Preterm  [redacted]w[redacted]d    Vag-Spont  N FD      Past Surgical History: Past Surgical History  Procedure Laterality Date  . Diagnostic laparoscopy  May 2012  . Laparoscopy N/A 06/26/2012    Procedure: LAPAROSCOPY OPERATIVE with CHROMOPERTUBATION; Surgeon: Osborne Oman, MD; Location: [facility]; Service: Gynecology; Laterality: N/A;  . Salpingoophorectomy Left 06/26/2012    Procedure: SALPINGO OOPHORECTOMY; Surgeon: Osborne Oman, MD; Location: [facility]; Service: Gynecology; Laterality: Left;    Family  History: Family History  Problem Relation Age of Onset  . Anesthesia problems Neg Hx   . Cystic fibrosis Mother   . Hypertension Mother   . Diabetes Mother     Social History: History  Substance Use Topics  . Smoking status: Current Every Day Smoker -- 1.00 packs/day for 10 years    Types: Cigarettes    Last Attempt to Quit: 04/02/2014  . Smokeless tobacco: Never Used  . Alcohol Use: No    Allergies:  Allergies  Allergen Reactions  . Peanut-Containing Drug Products Anaphylaxis  . Spinach Anaphylaxis    Canned spinach  . Nsaids Hives, Itching and Nausea And Vomiting    All NSAIDs cause this reaction. Benadryl does not help. Ibuprofen also causes personality changes.   . Tramadol Itching and Nausea And Vomiting    Able to take Vicodin, Percocet    Meds:  Prescriptions prior to admission  Medication Sig Dispense Refill Last Dose  . EPINEPHrine 0.3 mg/0.3 mL IJ SOAJ injection Inject 0.3 mg into the muscle.     . Prenatal Vit-Fe Fumarate-FA (PRENATAL MULTIVITAMIN) TABS tablet Take 1 tablet by mouth daily at 12 noon.   Taking  . promethazine (PHENERGAN) 12.5 MG tablet Take 1 tablet (12.5 mg total) by mouth every 6 (six) hours as needed for nausea or vomiting. 30 tablet 0 Taking    ROS  Physical Exam  Last menstrual period 12/31/2013. GENERAL: Well-developed, well-nourished female in no acute distress.  EYES: normal sclera/conjunctiva; no lid-lag HENT: Atraumatic, normocephalic HEART: normal rate RESP: normal effort ABDOMEN: Soft, non-tender MUSCULOSKELETAL: Normal ROM EXTREMITIES: Nontender, no edema  NEURO/PSYCH: Alert and oriented, appropriate affect  PELVIC EXAM: Cervix pink, visually closed, without lesion, scant white creamy discharge, vaginal walls and external genitalia normal Bimanual exam: Cervix 0/long/high, firm, anterior, neg CMT, uterus nontender, nonenlarged,  adnexa without tenderness, enlargement, or mass  Dilation: Closed Effacement (%): Thick Cervical Position: Anterior Exam by:: Fatima Blank CNM   Labs:  Lab Results Last 24 Hours    No results found for this or any previous visit (from the past 24 hour(s)).    Imaging:   Imaging Results    US Ob Follow Up  07/21/2014 OBSTETRICAL ULTRASOUND: This exam was performed within a [facility]. The OB US report was generated in the AS system, and faxed to the ordering physician. This report is available in the [facility]. See the AS Obstetric US report via the Image Link.  US Ob Transvaginal  07/21/2014 OBSTETRICAL ULTRASOUND: This exam was performed within a [facility]. The OB US report was generated in the AS system, and faxed to the ordering physician. This report is available in the [facility]. See the AS Obstetric US report via the Image Link.  [place] [facility]fm Ob Transvaginal  08/04/2014 OBSTETRICAL ULTRASOUND: This exam was performed within a [facility]. The OB US report was generated in the AS system, and faxed to the ordering physician. This report is available in the [facility]. See the AS Obstetric US report via the Image Link.   ED Course   Assessment: 1. History of intrauterine fetal death, currently pregnant   2. Abdominal pain affecting pregnancy     Plan: Discharge home Labor precautions and fetal kick counts    Medication List    ASK your doctor about these medications       EPINEPHrine 0.3 mg/0.3 mL Soaj injection  Commonly known as: EPI-PEN  Inject 0.3 mg into the muscle.     prenatal multivitamin Tabs tablet  Take 1 tablet by mouth daily at 12 noon.     promethazine 12.5 MG tablet  Commonly known as: PHENERGAN  Take 1 tablet (12.5 mg total) by mouth every 6 (six) hours as needed for nausea or vomiting.        Fatima Blank Certified  Nurse-Midwife 08/06/2014 4:46 PM       Attestation of Attending Supervision of Advanced Practitioner (PA/CNM/NP): Evaluation and management procedures were performed by the Advanced Practitioner under my supervision and collaboration.  I have reviewed the Advanced Practitioner's note and chart, and I agree with the management and plan.  I have seen and examined this patient.  Briefly, ALISAN DOKES is a 34 y.o. female [phone] with IUP at [redacted]w[redacted]d presenting for contractions that started intermittently at 2 AM it has worsened in intensity and frequency over the course the day. The patient's came to the MAU with continued contractions, which she was breathing through. She was given fentanyl IV to help with the contractions, as well as IV fluids. Currently she is not feeling any contractions although has some isolated uterine irritability. Her pregnancy is complicated with 3 fetal demise is related to abruption's. In review of her prenatal record, this pregnancy has been normal thus far, despite her high-risk pregnancy due to her poor obstetrical history. In addition to her contractions, she does complain of some mild-to-moderate back pain and nausea and vomiting due to the IV pain medicine. She denies vaginal discharge, vaginal bleeding, decreased fetal activity, headache, blurred vision, swelling, leaking fluid   Review of  Systems:  Full 10 system review of systems was performed which was negative except for items mentioned in the history of present illness.   Review of systems are otherwise negative  Prenatal History/Complications: 3 secondary losses secondary to placental abruption's. Additionally the patient has 2 uterine fibroids which are 5.9 x 5.7 x 5.37 cm and 2.9 x 3.4 x 1.9 cm in size. These are not obstructing the uterine cavity or uterine L it.  Physical Exam: BP 123/71 mmHg  Pulse 86  SpO2 95%  LMP 12/31/2013 (Approximate)  General appearance: alert, cooperative and no  distress Head: Normocephalic, without obvious abnormality, atraumatic, Right and left external ear normal Eyes: Extractor muscles are intact, pupils equal and round, conjunctiva normal. Sclerae anicteric. Neck: no adenopathy, no JVD and supple, symmetrical, trachea midline Lungs: clear to auscultation bilaterally Heart: regular rate and rhythm, S1, S2 normal, no murmur, click, rub or gallop Abdomen: soft, non-tender; bowel sounds normal; no masses,  no organomegaly fundus at approximately 24 cm Skin: Skin color, texture, turgor normal. No rashes or lesions unsure  Neuro: Alert and oriented 3. No focal neurological deficit observed. Cranial nerves II through XII are grossly intact.  Baseline: 130s bpm, Variability: Good {> 6 bpm), Accelerations: Reactive and Decelerations: Absent A few isolated contractions with uterine irritability  Dilation: Closed Effacement (%): Thick Exam by:: Fatima Blank CNM  Labs: Urinalysis: Clear yellow urine with specific gravity 1.015 with 30 of protein and 15 of ketones. Negative nitrites and negative leukocytes.  Fetal fibronectin: Negative           Assessment/Plan Present on Admission:  . Threatened preterm labor  #1 threatened preterm labor #2 history of intrauterine fetal death #3 intrauterine pregnancy at 45 weeks and 4 days #51  34 year old G4 P0 48  We'll admit for observation due to threatened preterm labor. Currently the patient is not having any contractions and her fetal fibronectin is negative, which would rule against preterm labor and delivery. However due to her poor obstetrical history with 3 fetal demise is secondary to placental abruption is, I feel it prudent to be cautious. With this in mind, we'll order gonorrhea and chlamydia test on admission to rule out infection as a source of her threatened preterm labor. Additionally, will order CBC and type and screen.   In addition to the vaginal testing, will order betamethasone as  a precaution and leave the patient on continuous monitoring overnight. We'll continue with IV fluids. Currently, the baby's heart rate is very reassuring and reactive.    Family Communication: Her husband was in the room   Disposition Plan: Likely home tomorrow after 24-hour observation   Truett Mainland, DO 08/06/2014 6:51 PM Faculty Practice Attending Physician [place] Attending Phone #: [phone]

## 2014-08-06 NOTE — Progress Notes (Signed)
Patient vomiting notified Fatima Blank CNM requesting nausea med.

## 2014-08-06 NOTE — MAU Provider Note (Signed)
Chief Complaint:  No chief complaint on file.   First Provider Initiated Contact with Patient 08/06/14 1624      HPI: [name] [name] is a 34 y.o. G4P0300 at 22w4dwho presents to maternity admissions reporting contractions described as tightening and cramping intermittently since 2 am, worsening over the course of the day. She is breathing through contractions upon arrival in MAU. She has poor obstetric hx with fetal demise x 3, related to abruption.  She reports good fetal movement, denies LOF, vaginal bleeding, vaginal itching/burning, urinary symptoms, h/a, dizziness, n/v, or fever/chills.    Abdominal Pain This is a new problem. The current episode started today. The onset quality is sudden. The problem occurs intermittently. The most recent episode lasted 1 day. The problem has been gradually worsening. The pain is located in the LLQ, RLQ and suprapubic region. The pain is severe. The quality of the pain is cramping. The abdominal pain radiates to the back. Associated symptoms include nausea. Pertinent negatives include no diarrhea, dysuria, fever, frequency, headaches or vomiting. Nothing aggravates the pain. She has tried nothing for the symptoms.    Past Medical History: Past Medical History  Diagnosis Date  . Ovarian cyst   . Abnormal Pap smear   . PID (pelvic inflammatory disease)   . Gonorrhea   . Trichomonas   . Fibroids   . Uterine fibroid 02/15/2014    Past obstetric history: OB History  Gravida Para Term Preterm AB SAB TAB Ectopic Multiple Living  4 3 0 3 0 0 0 0 0 0     # Outcome Date GA Lbr Len/2nd Weight Sex Delivery Anes PTL Lv  4 Current           3 Preterm 04/02/00 [redacted]w[redacted]d    Vag-Spont  N FD  2 Preterm 04/02/98 [redacted]w[redacted]d    Vag-Spont  N FD     Complications: Abruptio Placenta  1 Preterm  [redacted]w[redacted]d    Vag-Spont  N FD      Past Surgical History: Past Surgical History  Procedure Laterality Date  . Diagnostic laparoscopy  May 2012  . Laparoscopy N/A 06/26/2012   Procedure: LAPAROSCOPY OPERATIVE with CHROMOPERTUBATION;  Surgeon: Osborne Oman, MD;  Location: [facility];  Service: Gynecology;  Laterality: N/A;  . Salpingoophorectomy Left 06/26/2012    Procedure: SALPINGO OOPHORECTOMY;  Surgeon: Osborne Oman, MD;  Location: [facility];  Service: Gynecology;  Laterality: Left;    Family History: Family History  Problem Relation Age of Onset  . Anesthesia problems Neg Hx   . Cystic fibrosis Mother   . Hypertension Mother   . Diabetes Mother     Social History: History  Substance Use Topics  . Smoking status: Current Every Day Smoker -- 1.00 packs/day for 10 years    Types: Cigarettes    Last Attempt to Quit: 04/02/2014  . Smokeless tobacco: Never Used  . Alcohol Use: No    Allergies:  Allergies  Allergen Reactions  . Peanut-Containing Drug Products Anaphylaxis  . Spinach Anaphylaxis    Canned spinach  . Nsaids Hives, Itching and Nausea And Vomiting    All NSAIDs cause this reaction. Benadryl does not help. Ibuprofen also causes personality changes.   . Tramadol Itching and Nausea And Vomiting    Able to take Vicodin, Percocet    Meds:  Prescriptions prior to admission  Medication Sig Dispense Refill Last Dose  . Prenatal Vit-Fe Fumarate-FA (PRENATAL MULTIVITAMIN) TABS tablet Take 1 tablet by mouth daily at 12 noon.  08/06/2014 at Unknown time  . promethazine (PHENERGAN) 12.5 MG tablet Take 1 tablet (12.5 mg total) by mouth every 6 (six) hours as needed for nausea or vomiting. 30 tablet 0 08/05/2014 at Unknown time  . EPINEPHrine 0.3 mg/0.3 mL IJ SOAJ injection Inject 0.3 mg into the muscle.   Emergency    Review of Systems  Constitutional: Negative for fever, chills and malaise/fatigue.  Eyes: Negative for blurred vision.  Respiratory: Negative for cough and shortness of breath.   Cardiovascular: Negative for chest pain.  Gastrointestinal: Positive for nausea and abdominal pain. Negative for heartburn, vomiting and diarrhea.    Genitourinary: Negative for dysuria, urgency and frequency.  Musculoskeletal: Negative.   Neurological: Negative for dizziness and headaches.  Psychiatric/Behavioral: Negative for depression.    Physical Exam  Blood pressure 123/71, pulse 86, last menstrual period 12/31/2013, SpO2 95 %. GENERAL: Well-developed, well-nourished female in no acute distress.  EYES: normal sclera/conjunctiva; no lid-lag HENT: Atraumatic, normocephalic HEART: normal rate RESP: normal effort ABDOMEN: Soft, non-tender MUSCULOSKELETAL: Normal ROM EXTREMITIES: Nontender, no edema NEURO/PSYCH: Alert and oriented, appropriate affect   Dilation: Closed Effacement (%): Thick Cervical Position: Anterior Exam by:: Fatima Blank CNM  FHT:  Baseline 135, moderate variability, accelerations present, no decelerations Contractions: Q 3 minutes x 1 episode on toco, difficult to trace at other times, toco readjusted for improved tracing   Labs: Results for orders placed or performed during the hospital encounter of 08/06/14 (from the past 24 hour(s))  Fetal fibronectin     Status: None   Collection Time: 08/06/14  4:20 PM  Result Value Ref Range   Fetal Fibronectin NEGATIVE NEGATIVE  Urinalysis, Routine w reflex microscopic     Status: Abnormal   Collection Time: 08/06/14  5:05 PM  Result Value Ref Range   Color, Urine YELLOW YELLOW   APPearance CLEAR CLEAR   Specific Gravity, Urine 1.015 1.005 - 1.030   pH 8.5 (H) 5.0 - 8.0   Glucose, UA NEGATIVE NEGATIVE mg/dL   Hgb urine dipstick NEGATIVE NEGATIVE   Bilirubin Urine NEGATIVE NEGATIVE   Ketones, ur 15 (A) NEGATIVE mg/dL   Protein, ur 30 (A) NEGATIVE mg/dL   Urobilinogen, UA 0.2 0.0 - 1.0 mg/dL   Nitrite NEGATIVE NEGATIVE   Leukocytes, UA NEGATIVE NEGATIVE  Urine microscopic-add on     Status: Abnormal   Collection Time: 08/06/14  5:05 PM  Result Value Ref Range   Squamous Epithelial / LPF FEW (A) RARE   WBC, UA 0-2 <3 WBC/hpf   Urine-Other  AMORPHOUS URATES/PHOSPHATES     Imaging:    ED Course   Assessment: 1. Threatened preterm labor, second trimester   2. History of intrauterine fetal death, currently pregnant   3. Abdominal pain affecting pregnancy   4. Supervision of high risk pregnancy, antepartum, second trimester     Plan: Consult Dr Ansel Bong, reviewed history, assessment, labs Dr Nehemiah Settle to bedside Admit to antepartum for observation, BMZ     Medication List    ASK your doctor about these medications        EPINEPHrine 0.3 mg/0.3 mL Soaj injection  Commonly known as:  EPI-PEN  Inject 0.3 mg into the muscle.     prenatal multivitamin Tabs tablet  Take 1 tablet by mouth daily at 12 noon.     promethazine 12.5 MG tablet  Commonly known as:  PHENERGAN  Take 1 tablet (12.5 mg total) by mouth every 6 (six) hours as needed for nausea or vomiting.  Fatima Blank Certified Nurse-Midwife 08/06/2014 7:16 PM

## 2014-08-06 NOTE — Progress Notes (Signed)
Patient up to bathroom with assist of myself and her husband, patient obtained urine specimen then started vomiting. Patient c/o dizziness put in wheelchair brought back to room 2 in MAU to bed.

## 2014-08-07 DIAGNOSIS — F17210 Nicotine dependence, cigarettes, uncomplicated: Secondary | ICD-10-CM

## 2014-08-07 DIAGNOSIS — Z833 Family history of diabetes mellitus: Secondary | ICD-10-CM

## 2014-08-07 DIAGNOSIS — O99332 Smoking (tobacco) complicating pregnancy, second trimester: Secondary | ICD-10-CM

## 2014-08-07 LAB — HIV ANTIBODY (ROUTINE TESTING W REFLEX): HIV Screen 4th Generation wRfx: NONREACTIVE

## 2014-08-07 MED ORDER — ONDANSETRON HCL 4 MG/2ML IJ SOLN
4.0000 mg | Freq: Four times a day (QID) | INTRAMUSCULAR | Status: DC
  Administered 2014-08-07: 4 mg via INTRAVENOUS
  Filled 2014-08-07: qty 2

## 2014-08-07 MED ORDER — NIFEDIPINE ER OSMOTIC RELEASE 30 MG PO TB24
30.0000 mg | ORAL_TABLET | Freq: Two times a day (BID) | ORAL | Status: DC
  Administered 2014-08-07: 30 mg via ORAL
  Filled 2014-08-07: qty 1

## 2014-08-07 MED ORDER — NIFEDIPINE ER OSMOTIC RELEASE 30 MG PO TB24: 30.0000 mg | ORAL_TABLET | Freq: Two times a day (BID) | ORAL | Status: DC

## 2014-08-07 MED ORDER — NIFEDIPINE ER 30 MG PO TB24: 30.0000 mg | ORAL_TABLET | Freq: Two times a day (BID) | ORAL | Status: DC

## 2014-08-07 NOTE — Plan of Care (Signed)
Problem: Phase I Progression Outcomes Goal: Bowel movement every 2 days Outcome: Progressing Patient complains of discomfort from constipation and reports that it has been several since having a bowel movement. Patient requesting stool softeners and enema. Provider notified and orders received.

## 2014-08-09 LAB — GC/CHLAMYDIA PROBE AMP (~~LOC~~) NOT AT ARMC
CHLAMYDIA, DNA PROBE: NEGATIVE
NEISSERIA GONORRHEA: NEGATIVE

## 2014-08-12 ENCOUNTER — Ambulatory Visit (INDEPENDENT_AMBULATORY_CARE_PROVIDER_SITE_OTHER): Payer: 59 | Admitting: Obstetrics & Gynecology

## 2014-08-12 VITALS — BP 114/60 | HR 81 | Temp 98.2°F | Wt 143.5 lb

## 2014-08-12 DIAGNOSIS — O9989 Other specified diseases and conditions complicating pregnancy, childbirth and the puerperium: Secondary | ICD-10-CM

## 2014-08-12 DIAGNOSIS — R102 Pelvic and perineal pain: Secondary | ICD-10-CM

## 2014-08-12 DIAGNOSIS — O26899 Other specified pregnancy related conditions, unspecified trimester: Secondary | ICD-10-CM

## 2014-08-12 LAB — POCT URINALYSIS DIP (DEVICE)
BILIRUBIN URINE: NEGATIVE
Glucose, UA: NEGATIVE mg/dL
Hgb urine dipstick: NEGATIVE
Ketones, ur: NEGATIVE mg/dL
Leukocytes, UA: NEGATIVE
NITRITE: NEGATIVE
PROTEIN: NEGATIVE mg/dL
Specific Gravity, Urine: 1.02 (ref 1.005–1.030)
UROBILINOGEN UA: 0.2 mg/dL (ref 0.0–1.0)
pH: 7.5 (ref 5.0–8.0)

## 2014-08-12 NOTE — Progress Notes (Signed)
Reports contractions have started again-- had 4 strong contractions this morning. Not taking procardia.

## 2014-08-12 NOTE — Progress Notes (Signed)
Pt having contractions--causes pain.  Cervix closed and long.  Will take procardia for pain relief.  Discussed that narcotics not advised in pregnancy.  Pt seeing MFM for co-management and scans.  Send U culture today for symptoms.

## 2014-08-13 LAB — CULTURE, OB URINE
Colony Count: NO GROWTH
Organism ID, Bacteria: NO GROWTH

## 2014-08-19 ENCOUNTER — Other Ambulatory Visit (HOSPITAL_COMMUNITY): Payer: Self-pay | Admitting: Maternal and Fetal Medicine

## 2014-08-19 ENCOUNTER — Ambulatory Visit (HOSPITAL_COMMUNITY)
Admission: RE | Admit: 2014-08-19 | Discharge: 2014-08-19 | Disposition: A | Discharge status: Home / Self Care | Payer: 59 | Source: Ambulatory Visit | Attending: Obstetrics & Gynecology | Admitting: Obstetrics & Gynecology

## 2014-08-19 ENCOUNTER — Encounter (HOSPITAL_COMMUNITY): Payer: Self-pay

## 2014-08-19 DIAGNOSIS — D259 Leiomyoma of uterus, unspecified: Secondary | ICD-10-CM | POA: Insufficient documentation

## 2014-08-19 DIAGNOSIS — O09299 Supervision of pregnancy with other poor reproductive or obstetric history, unspecified trimester: Secondary | ICD-10-CM

## 2014-08-19 DIAGNOSIS — O99323 Drug use complicating pregnancy, third trimester: Secondary | ICD-10-CM

## 2014-08-19 DIAGNOSIS — O09292 Supervision of pregnancy with other poor reproductive or obstetric history, second trimester: Secondary | ICD-10-CM | POA: Diagnosis not present

## 2014-08-19 DIAGNOSIS — O3412 Maternal care for benign tumor of corpus uteri, second trimester: Secondary | ICD-10-CM | POA: Diagnosis not present

## 2014-08-19 DIAGNOSIS — Z3A26 26 weeks gestation of pregnancy: Secondary | ICD-10-CM | POA: Diagnosis not present

## 2014-08-21 ENCOUNTER — Inpatient Hospital Stay (HOSPITAL_COMMUNITY)
Admission: AD | Admit: 2014-08-21 | Discharge: 2014-08-21 | Disposition: A | Discharge status: Home / Self Care | Payer: 59 | Source: Ambulatory Visit | Attending: Obstetrics & Gynecology | Admitting: Obstetrics & Gynecology

## 2014-08-21 ENCOUNTER — Encounter (HOSPITAL_COMMUNITY): Payer: Self-pay | Admitting: *Deleted

## 2014-08-21 DIAGNOSIS — R1031 Right lower quadrant pain: Secondary | ICD-10-CM | POA: Insufficient documentation

## 2014-08-21 DIAGNOSIS — O3413 Maternal care for benign tumor of corpus uteri, third trimester: Secondary | ICD-10-CM | POA: Diagnosis not present

## 2014-08-21 DIAGNOSIS — Z3A26 26 weeks gestation of pregnancy: Secondary | ICD-10-CM | POA: Diagnosis not present

## 2014-08-21 DIAGNOSIS — N949 Unspecified condition associated with female genital organs and menstrual cycle: Secondary | ICD-10-CM

## 2014-08-21 DIAGNOSIS — R110 Nausea: Secondary | ICD-10-CM | POA: Diagnosis not present

## 2014-08-21 DIAGNOSIS — O99333 Smoking (tobacco) complicating pregnancy, third trimester: Secondary | ICD-10-CM | POA: Diagnosis not present

## 2014-08-21 DIAGNOSIS — O9989 Other specified diseases and conditions complicating pregnancy, childbirth and the puerperium: Secondary | ICD-10-CM | POA: Diagnosis not present

## 2014-08-21 DIAGNOSIS — R109 Unspecified abdominal pain: Secondary | ICD-10-CM | POA: Diagnosis present

## 2014-08-21 DIAGNOSIS — O4702 False labor before 37 completed weeks of gestation, second trimester: Secondary | ICD-10-CM

## 2014-08-21 DIAGNOSIS — O3412 Maternal care for benign tumor of corpus uteri, second trimester: Secondary | ICD-10-CM

## 2014-08-21 DIAGNOSIS — F17210 Nicotine dependence, cigarettes, uncomplicated: Secondary | ICD-10-CM | POA: Insufficient documentation

## 2014-08-21 DIAGNOSIS — D259 Leiomyoma of uterus, unspecified: Secondary | ICD-10-CM

## 2014-08-21 DIAGNOSIS — R102 Pelvic and perineal pain: Secondary | ICD-10-CM | POA: Diagnosis not present

## 2014-08-21 LAB — URINALYSIS, ROUTINE W REFLEX MICROSCOPIC
BILIRUBIN URINE: NEGATIVE
GLUCOSE, UA: NEGATIVE mg/dL
HGB URINE DIPSTICK: NEGATIVE
Ketones, ur: 15 mg/dL — AB
Leukocytes, UA: NEGATIVE
Nitrite: NEGATIVE
PROTEIN: NEGATIVE mg/dL
SPECIFIC GRAVITY, URINE: 1.025 (ref 1.005–1.030)
Urobilinogen, UA: 0.2 mg/dL (ref 0.0–1.0)
pH: 6.5 (ref 5.0–8.0)

## 2014-08-21 LAB — FETAL FIBRONECTIN: Fetal Fibronectin: NEGATIVE

## 2014-08-21 LAB — WET PREP, GENITAL
CLUE CELLS WET PREP: NONE SEEN
Trich, Wet Prep: NONE SEEN
YEAST WET PREP: NONE SEEN

## 2014-08-21 MED ORDER — NIFEDIPINE 10 MG PO CAPS
10.0000 mg | ORAL_CAPSULE | Freq: Once | ORAL | Status: AC
  Administered 2014-08-21: 10 mg via ORAL
  Filled 2014-08-21: qty 1

## 2014-08-21 MED ORDER — OXYCODONE-ACETAMINOPHEN 5-325 MG PO TABS: 1.0000 | ORAL_TABLET | ORAL | Status: DC | PRN

## 2014-08-21 MED ORDER — CYCLOBENZAPRINE HCL 10 MG PO TABS: 10.0000 mg | ORAL_TABLET | Freq: Three times a day (TID) | ORAL | Status: DC | PRN

## 2014-08-21 MED ORDER — NIFEDIPINE ER 30 MG PO TB24: 30.0000 mg | ORAL_TABLET | Freq: Two times a day (BID) | ORAL | Status: DC

## 2014-08-21 MED ORDER — OXYCODONE-ACETAMINOPHEN 5-325 MG PO TABS
1.0000 | ORAL_TABLET | Freq: Once | ORAL | Status: AC
  Administered 2014-08-21: 1 via ORAL
  Filled 2014-08-21: qty 1

## 2014-08-21 NOTE — Discharge Instructions (Signed)
Call the clinic [phone]) or go to [facility] if:  You begin to have strong, frequent contractions  Your water breaks.  Sometimes it is a big gush of fluid, sometimes it is just a trickle that keeps getting your panties wet or running down your legs  You have vaginal bleeding.  It is normal to have a small amount of spotting if your cervix was checked.   You don't feel your baby moving like normal.  If you don't, get you something to eat and drink and lay down and focus on feeling your baby move.  You should feel at least 10 movements in 2 hours.  If you don't, you should call the office or go to [place] Preterm labor is when labor starts at less than 37 weeks of pregnancy. The normal length of a pregnancy is 39 to 41 weeks. CAUSES Often, there is no identifiable underlying cause as to why a woman goes into preterm labor. One of the most common known causes of preterm labor is infection. Infections of the uterus, cervix, vagina, amniotic sac, bladder, kidney, or even the lungs (pneumonia) can cause labor to start. Other suspected causes of preterm labor include:   Urogenital infections, such as yeast infections and bacterial vaginosis.   Uterine abnormalities (uterine shape, uterine septum, fibroids, or bleeding from the placenta).   A cervix that has been operated on (it may fail to stay closed).   Malformations in the fetus.   Multiple gestations (twins, triplets, and so on).   Breakage of the amniotic sac.  RISK FACTORS 1. Having a previous history of preterm labor.  2. Having premature rupture of membranes (PROM).  3. Having a placenta that covers the opening of the cervix (placenta previa).  4. Having a placenta that separates from the uterus (placental abruption).  5. Having a cervix that is too weak to hold the fetus in the uterus (incompetent cervix).  6. Having too much fluid in the amniotic sac (polyhydramnios).   7. Taking illegal drugs or smoking while pregnant.  8. Not gaining enough weight while pregnant.  67. Being younger than 81 and older than 34 years old.  10. Having a low socioeconomic status.  21. Being African American. SYMPTOMS Signs and symptoms of preterm labor include:   Menstrual-like cramps, abdominal pain, or back pain.  Uterine contractions that are regular, as frequent as six in an hour, regardless of their intensity (may be mild or painful).  Contractions that start on the top of the uterus and spread down to the lower abdomen and back.   A sense of increased pelvic pressure.   A watery or bloody mucus discharge that comes from the vagina.  TREATMENT Depending on the length of the pregnancy and other circumstances, your health care provider may suggest bed rest. If necessary, there are medicines that can be given to stop contractions and to mature the fetal lungs. If labor happens before 34 weeks of pregnancy, a prolonged hospital stay may be recommended. Treatment depends on the condition of both you and the fetus.  WHAT SHOULD YOU DO IF YOU THINK YOU ARE IN PRETERM LABOR? Call your health care provider right away. You will need to go to the hospital to get checked immediately. HOW CAN YOU PREVENT PRETERM LABOR IN FUTURE PREGNANCIES? You should:   Stop smoking if you smoke.  Maintain healthy weight gain and avoid chemicals and drugs that are not necessary.  Be watchful for  any type of infection.  Inform your health care provider if you have a known history of preterm labor. Document Released: 06/09/2003 Document Revised: 11/19/2012 Document Reviewed: 04/21/2012 [facility] Patient Information 2015 [place], [place]. This information is not intended to replace advice given to you by your health care provider. Make sure you discuss any questions you have with your health care provider.  Fetal Movement Counts Patient Name:  __________________________________________________ Patient Due Date: ____________________ Performing a fetal movement count is highly recommended in high-risk pregnancies, but it is good for every pregnant woman to do. Your health care provider may ask you to start counting fetal movements at 28 weeks of the pregnancy. Fetal movements often increase:  After eating a full meal.  After physical activity.  After eating or drinking something sweet or cold.  At rest. Pay attention to when you feel the baby is most active. This will help you notice a pattern of your baby's sleep and wake cycles and what factors contribute to an increase in fetal movement. It is important to perform a fetal movement count at the same time each day when your baby is normally most active.  HOW TO COUNT FETAL MOVEMENTS 12. Find a quiet and comfortable area to sit or lie down on your left side. Lying on your left side provides the best blood and oxygen circulation to your baby. 13. Write down the day and time on a sheet of paper or in a journal. 14. Start counting kicks, flutters, swishes, rolls, or jabs in a 2-hour period. You should feel at least 10 movements within 2 hours. 15. If you do not feel 10 movements in 2 hours, wait 2-3 hours and count again. Look for a change in the pattern or not enough counts in 2 hours. SEEK MEDICAL CARE IF:  You feel less than 10 counts in 2 hours, tried twice.  There is no movement in over an hour.  The pattern is changing or taking longer each day to reach 10 counts in 2 hours.  You feel the baby is not moving as he or she usually does. Date: ____________ Movements: ____________ Start time: ____________ [name] [name] time: ____________  Date: ____________ Movements: ____________ Start time: ____________ [name] [name] time: ____________ Date: ____________ Movements: ____________ Start time: ____________ [name] [name] time: ____________ Date: ____________ Movements: ____________ Start time:  ____________ [name] [name] time: ____________ Date: ____________ Movements: ____________ Start time: ____________ [name] [name] time: ____________ Date: ____________ Movements: ____________ Start time: ____________ [name] [name] time: ____________ Date: ____________ Movements: ____________ Start time: ____________ [name] [name] time: ____________ Date: ____________ Movements: ____________ Start time: ____________ [name] [name] time: ____________  Date: ____________ Movements: ____________ Start time: ____________ [name] [name] time: ____________ Date: ____________ Movements: ____________ Start time: ____________ [name] [name] time: ____________ Date: ____________ Movements: ____________ Start time: ____________ [name] [name] time: ____________ Date: ____________ Movements: ____________ Start time: ____________ [name] [name] time: ____________ Date: ____________ Movements: ____________ Start time: ____________ [name] [name] time: ____________ Date: ____________ Movements: ____________ Start time: ____________ [name] [name] time: ____________ Date: ____________ Movements: ____________ Start time: ____________ [name] [name] time: ____________  Date: ____________ Movements: ____________ Start time: ____________ [name] [name] time: ____________ Date: ____________ Movements: ____________ Start time: ____________ [name] [name] time: ____________ Date: ____________ Movements: ____________ Start time: ____________ [name] [name] time: ____________ Date: ____________ Movements: ____________ Start time: ____________ [name] [name] time: ____________ Date: ____________ Movements: ____________ Start time: ____________ [name] [name] time: ____________ Date: ____________ Movements: ____________ Start time: ____________ [name] [name] time: ____________ Date: ____________ Movements: ____________ Start time: ____________ [name] [name] time: ____________  Date: ____________ Movements: ____________ Start time: ____________ [name] [name] time: ____________ Date: ____________ Movements: ____________ Start  time: ____________ [name] [name] time: ____________ Date:  ____________ Movements: ____________ Start time: ____________ [name] [name] time: ____________ Date: ____________ Movements: ____________ Start time: ____________ [name] [name] time: ____________ Date: ____________ Movements: ____________ Start time: ____________ [name] [name] time: ____________ Date: ____________ Movements: ____________ Start time: ____________ [name] [name] time: ____________ Date: ____________ Movements: ____________ Start time: ____________ [name] [name] time: ____________  Date: ____________ Movements: ____________ Start time: ____________ [name] [name] time: ____________ Date: ____________ Movements: ____________ Start time: ____________ [name] [name] time: ____________ Date: ____________ Movements: ____________ Start time: ____________ [name] [name] time: ____________ Date: ____________ Movements: ____________ Start time: ____________ [name] [name] time: ____________ Date: ____________ Movements: ____________ Start time: ____________ [name] [name] time: ____________ Date: ____________ Movements: ____________ Start time: ____________ [name] [name] time: ____________ Date: ____________ Movements: ____________ Start time: ____________ [name] [name] time: ____________  Date: ____________ Movements: ____________ Start time: ____________ [name] [name] time: ____________ Date: ____________ Movements: ____________ Start time: ____________ [name] [name] time: ____________ Date: ____________ Movements: ____________ Start time: ____________ [name] [name] time: ____________ Date: ____________ Movements: ____________ Start time: ____________ [name] [name] time: ____________ Date: ____________ Movements: ____________ Start time: ____________ [name] [name] time: ____________ Date: ____________ Movements: ____________ Start time: ____________ [name] [name] time: ____________ Date: ____________ Movements: ____________ Start time: ____________ [name] [name] time: ____________  Date: ____________ Movements: ____________ Start time: ____________ [name] [name] time: ____________ Date: ____________ Movements: ____________ Start  time: ____________ [name] [name] time: ____________ Date: ____________ Movements: ____________ Start time: ____________ [name] [name] time: ____________ Date: ____________ Movements: ____________ Start time: ____________ [name] [name] time: ____________ Date: ____________ Movements: ____________ Start time: ____________ [name] [name] time: ____________ Date: ____________ Movements: ____________ Start time: ____________ [name] [name] time: ____________ Date: ____________ Movements: ____________ Start time: ____________ [name] [name] time: ____________  Date: ____________ Movements: ____________ Start time: ____________ [name] [name] time: ____________ Date: ____________ Movements: ____________ Start time: ____________ [name] [name] time: ____________ Date: ____________ Movements: ____________ Start time: ____________ [name] [name] time: ____________ Date: ____________ Movements: ____________ Start time: ____________ [name] [name] time: ____________ Date: ____________ Movements: ____________ Start time: ____________ [name] [name] time: ____________ Date: ____________ Movements: ____________ Start time: ____________ [name] [name] time: ____________ Document Released: 04/18/2006 Document Revised: 08/03/2013 Document Reviewed: 01/14/2012 [employer] Patient Information 2015 [employer]. This information is not intended to replace advice given to you by your health care provider. Make sure you discuss any questions you have with your health care provider.

## 2014-08-21 NOTE — MAU Note (Signed)
Started having lower right sided pain, feels like a knot near ovary area.  High risk pregnancy as a result of 3 m/c.  Currently taking Procardia for CTX at home last dose this am before noon (no more at home).  Contractions started wednes. Went to [facility] office Thursday, cervix exam revealed no changes.  Plans for bedrest and continue procardia.  Contact md for different meds since contractions are continuing.  Denies vaginal bleeding.  Last intercourse greater than 48 hours.. Nothing vaginally since

## 2014-08-21 NOTE — MAU Provider Note (Signed)
History  Chief Complaint:  Abdominal Pain  [name] [name] is a 34 y.o. G73P0300 female at [redacted]w[redacted]d presenting w/ report of constant achy pain on top of Rt mons area w/ intermittent sharp stabbing pain since last night, she feels a knot in this area when she touches w/ fingers. Has been unable to stand/walk, has been crawling to bathroom. Denies fever/chills, vomiting/diarrhea. Mild nausea r/b phenergan at home. UC's since Wednesday, takes procardia 30xl bid, helps some- last dose this am and needs a new rx.  Reports slightly decreased fetal movement, contractions: regular, vaginal bleeding: none, membranes: intact. Denies uti s/s, abnormal/malodorous vag d/c or vulvovaginal itching/irritation.   Prenatal care at [facility].  Next visit 08/31/14. Pregnancy complicated by h/o IUFD x 3 (2 at 20wks, one @ 28wks r/t abruption), Rt LUS/cervical and Rt anterior fibroid.  Last u/s 5/19 @ 26.3wks: efw  58%, CL: 3.7cm, LUS fibroid 4x5x4cm, Rt anterior fibroid 2x3x1.   Obstetrical History: OB History    Gravida Para Term Preterm AB TAB SAB Ectopic Multiple Living   4 3 0 3 0 0 0 0 0 0       Past Medical History: Past Medical History  Diagnosis Date  . Ovarian cyst   . Abnormal Pap smear   . PID (pelvic inflammatory disease)   . Gonorrhea   . Trichomonas   . Fibroids   . Uterine fibroid 02/15/2014    Past Surgical History: Past Surgical History  Procedure Laterality Date  . Diagnostic laparoscopy  May 2012  . Laparoscopy N/A 06/26/2012    Procedure: LAPAROSCOPY OPERATIVE with CHROMOPERTUBATION;  Surgeon: Osborne Oman, MD;  Location: [facility];  Service: Gynecology;  Laterality: N/A;  . Salpingoophorectomy Left 06/26/2012    Procedure: SALPINGO OOPHORECTOMY;  Surgeon: Osborne Oman, MD;  Location: [facility];  Service: Gynecology;  Laterality: Left;    Social History: History   Social History  . Marital Status: Married    Spouse Name: N/A  . Number of Children: N/A  . Years of Education: N/A    Social History Main Topics  . Smoking status: Current Every Day Smoker -- 1.00 packs/day for 10 years    Types: Cigarettes    Last Attempt to Quit: 04/02/2014  . Smokeless tobacco: Never Used  . Alcohol Use: No  . Drug Use: Yes    Special: Marijuana  . Sexual Activity: Yes    Birth Control/ Protection: None     Comment: Last Depo injection 09/01/2012   Other Topics Concern  . None   Social History Narrative    Allergies: Allergies  Allergen Reactions  . Peanut-Containing Drug Products Anaphylaxis  . Spinach Anaphylaxis  . Nsaids Hives, Itching and Nausea And Vomiting  . Tramadol Itching and Nausea And Vomiting    Prescriptions prior to admission  Medication Sig Dispense Refill Last Dose  . EPINEPHrine 0.3 mg/0.3 mL IJ SOAJ injection Inject 0.3 mg into the muscle once as needed (for allergic reaction).   PRN at PRN  . NIFEdipine (PROCARDIA-XL/ADALAT CC) 30 MG 24 hr tablet Take 1 tablet (30 mg total) by mouth 2 (two) times daily. 10 tablet 0 08/21/2014 at Unknown time  . Prenatal Vit-Fe Fumarate-FA (PRENATAL MULTIVITAMIN) TABS tablet Take 1 tablet by mouth daily.    08/21/2014 at Unknown time  . promethazine (PHENERGAN) 12.5 MG tablet Take 1 tablet (12.5 mg total) by mouth every 6 (six) hours as needed for nausea or vomiting. 30 tablet 0 08/21/2014 at Unknown time    Review  of Systems  Pertinent pos/neg as indicated in HPI  Physical Exam  Blood pressure 107/60, pulse 83, temperature 98.3 F (36.8 C), temperature source Oral, resp. rate 18, height 5\' 1"  (1.549 m), weight 65.318 kg (144 lb), last menstrual period 12/31/2013. General appearance: alert, cooperative and no distress Lungs: clear to auscultation bilaterally, normal effort Heart: regular rate and rhythm Abdomen: gravid, soft, generalized tenderness to palpation over entire lower abd/suprapubic region R>L, no 'knot' felt Extremities: no edema DTR's 2+  Spec exam: cx visually closed, anterior, small amount white  nonodorous d/c Bimanual, +tenderness, R>L, no masses/abnormalities felt Cultures/Specimens: fFN, wet prep, gc/ct  Dilation: Closed Exam by:: Knute Neu, CNM Presentation: unsure  Fetal monitoring: FHR: 125 bpm, variability: moderate,  Accelerations: Present,  decelerations:  Absent Uterine activity: q 2-3, mild to palpation  MAU Course  efm sse w/ ffn, wet prep, gc/ct Procardia 10mg  x 2 po-> uc's spaced out Percocet x 1  Labs:  Results for orders placed or performed during the hospital encounter of 08/21/14 (from the past 24 hour(s))  Urinalysis, Routine w reflex microscopic     Status: Abnormal   Collection Time: 08/21/14  7:34 PM  Result Value Ref Range   Color, Urine YELLOW YELLOW   APPearance CLEAR CLEAR   Specific Gravity, Urine 1.025 1.005 - 1.030   pH 6.5 5.0 - 8.0   Glucose, UA NEGATIVE NEGATIVE mg/dL   Hgb urine dipstick NEGATIVE NEGATIVE   Bilirubin Urine NEGATIVE NEGATIVE   Ketones, ur 15 (A) NEGATIVE mg/dL   Protein, ur NEGATIVE NEGATIVE mg/dL   Urobilinogen, UA 0.2 0.0 - 1.0 mg/dL   Nitrite NEGATIVE NEGATIVE   Leukocytes, UA NEGATIVE NEGATIVE  Fetal fibronectin     Status: None   Collection Time: 08/21/14  7:52 PM  Result Value Ref Range   Fetal Fibronectin NEGATIVE NEGATIVE  Wet prep, genital     Status: Abnormal   Collection Time: 08/21/14  7:52 PM  Result Value Ref Range   Yeast Wet Prep HPF POC NONE SEEN NONE SEEN   Trich, Wet Prep NONE SEEN NONE SEEN   Clue Cells Wet Prep HPF POC NONE SEEN NONE SEEN   WBC, Wet Prep HPF POC FEW (A) NONE SEEN    Imaging:  n/a  Assessment and Plan  A:  [redacted]w[redacted]d SIUP  G4P0300  Preterm uc's w/o cervical change  RLQ pain- possibly r/t Rt LUS fibroid vs. Muscle strain vs. RLP  Cat 1 FHR P:  Discussed w/ Dr. Ihor Dow, ok to d/c home w/ rx for percocet & motrin, however pt allergic to motrin  Rx percocet #10 and flexeril #10  Refilled procardia 30xl bid  Discussed pain relief measures  Reviewed ptl s/s, fkc,  reasons to return  Keep next appt at [facility] on 55/31 as scheduled or earlier if needed   Tawnya Crook CNM,WHNP-BC 5/21/20168:52 PM

## 2014-08-22 ENCOUNTER — Telehealth (HOSPITAL_COMMUNITY): Payer: Self-pay

## 2014-08-23 LAB — GC/CHLAMYDIA PROBE AMP (~~LOC~~) NOT AT ARMC
Chlamydia: NEGATIVE
Neisseria Gonorrhea: NEGATIVE

## 2014-08-25 ENCOUNTER — Encounter (HOSPITAL_COMMUNITY): Payer: Self-pay | Admitting: *Deleted

## 2014-08-25 ENCOUNTER — Inpatient Hospital Stay (HOSPITAL_COMMUNITY)
Admission: AD | Admit: 2014-08-25 | Discharge: 2014-08-25 | Disposition: A | Discharge status: Home / Self Care | Payer: 59 | Source: Ambulatory Visit | Attending: Obstetrics & Gynecology | Admitting: Obstetrics & Gynecology

## 2014-08-25 DIAGNOSIS — F17210 Nicotine dependence, cigarettes, uncomplicated: Secondary | ICD-10-CM | POA: Insufficient documentation

## 2014-08-25 DIAGNOSIS — O0992 Supervision of high risk pregnancy, unspecified, second trimester: Secondary | ICD-10-CM

## 2014-08-25 DIAGNOSIS — R112 Nausea with vomiting, unspecified: Secondary | ICD-10-CM

## 2014-08-25 DIAGNOSIS — O3412 Maternal care for benign tumor of corpus uteri, second trimester: Secondary | ICD-10-CM

## 2014-08-25 DIAGNOSIS — Z3A27 27 weeks gestation of pregnancy: Secondary | ICD-10-CM | POA: Diagnosis not present

## 2014-08-25 DIAGNOSIS — D259 Leiomyoma of uterus, unspecified: Secondary | ICD-10-CM

## 2014-08-25 DIAGNOSIS — O99332 Smoking (tobacco) complicating pregnancy, second trimester: Secondary | ICD-10-CM | POA: Insufficient documentation

## 2014-08-25 DIAGNOSIS — Z3A37 37 weeks gestation of pregnancy: Secondary | ICD-10-CM

## 2014-08-25 HISTORY — DX: Reserved for concepts with insufficient information to code with codable children: IMO0002

## 2014-08-25 LAB — CBC WITH DIFFERENTIAL/PLATELET
Basophils Absolute: 0 10*3/uL (ref 0.0–0.1)
Basophils Relative: 0 % (ref 0–1)
Eosinophils Absolute: 0.1 10*3/uL (ref 0.0–0.7)
Eosinophils Relative: 1 % (ref 0–5)
HEMATOCRIT: 31.3 % — AB (ref 36.0–46.0)
Hemoglobin: 10.7 g/dL — ABNORMAL LOW (ref 12.0–15.0)
LYMPHS ABS: 1.4 10*3/uL (ref 0.7–4.0)
LYMPHS PCT: 19 % (ref 12–46)
MCH: 27.8 pg (ref 26.0–34.0)
MCHC: 34.2 g/dL (ref 30.0–36.0)
MCV: 81.3 fL (ref 78.0–100.0)
MONO ABS: 0.5 10*3/uL (ref 0.1–1.0)
Monocytes Relative: 7 % (ref 3–12)
NEUTROS PCT: 73 % (ref 43–77)
Neutro Abs: 5.5 10*3/uL (ref 1.7–7.7)
PLATELETS: 278 10*3/uL (ref 150–400)
RBC: 3.85 MIL/uL — AB (ref 3.87–5.11)
RDW: 13.3 % (ref 11.5–15.5)
WBC: 7.5 10*3/uL (ref 4.0–10.5)

## 2014-08-25 LAB — URINALYSIS, ROUTINE W REFLEX MICROSCOPIC
Bilirubin Urine: NEGATIVE
Glucose, UA: NEGATIVE mg/dL
Hgb urine dipstick: NEGATIVE
Ketones, ur: >80 mg/dL — AB
LEUKOCYTES UA: NEGATIVE
Nitrite: NEGATIVE
PH: 8 (ref 5.0–8.0)
Protein, ur: NEGATIVE mg/dL
Specific Gravity, Urine: 1.02 (ref 1.005–1.030)
Urobilinogen, UA: 0.2 mg/dL (ref 0.0–1.0)

## 2014-08-25 MED ORDER — NIFEDIPINE 10 MG PO CAPS
10.0000 mg | ORAL_CAPSULE | Freq: Once | ORAL | Status: AC
  Administered 2014-08-25: 10 mg via ORAL
  Filled 2014-08-25: qty 1

## 2014-08-25 MED ORDER — LACTATED RINGERS IV BOLUS (SEPSIS)
1000.0000 mL | Freq: Once | INTRAVENOUS | Status: AC
  Administered 2014-08-25: 1000 mL via INTRAVENOUS

## 2014-08-25 MED ORDER — PROMETHAZINE HCL 25 MG PO TABS
12.5000 mg | ORAL_TABLET | Freq: Four times a day (QID) | ORAL | Status: DC | PRN
  Administered 2014-08-25: 12.5 mg via ORAL
  Filled 2014-08-25: qty 1

## 2014-08-25 MED ORDER — TERBUTALINE SULFATE 1 MG/ML IJ SOLN
0.2500 mg | Freq: Once | INTRAMUSCULAR | Status: AC
  Administered 2014-08-25: 0.25 mg via SUBCUTANEOUS
  Filled 2014-08-25: qty 1

## 2014-08-25 MED ORDER — METOCLOPRAMIDE HCL 5 MG/ML IJ SOLN
10.0000 mg | Freq: Once | INTRAMUSCULAR | Status: DC
  Filled 2014-08-25: qty 2

## 2014-08-25 MED ORDER — FENTANYL CITRATE (PF) 100 MCG/2ML IJ SOLN
50.0000 ug | Freq: Once | INTRAMUSCULAR | Status: AC
  Administered 2014-08-25: 50 ug via INTRAVENOUS
  Filled 2014-08-25: qty 2

## 2014-08-25 MED ORDER — ONDANSETRON HCL 4 MG/2ML IJ SOLN
4.0000 mg | Freq: Once | INTRAMUSCULAR | Status: DC
  Filled 2014-08-25: qty 2

## 2014-08-25 MED ORDER — METOCLOPRAMIDE HCL 5 MG/ML IJ SOLN
10.0000 mg | Freq: Once | INTRAMUSCULAR | Status: AC
  Administered 2014-08-25: 10 mg via INTRAVENOUS

## 2014-08-25 NOTE — MAU Provider Note (Signed)
History   CSN: [id number]  Arrival date and time: 08/25/14 1119  Chief Complaint  Patient presents with  . Contractions   HPI  Patient is 34 y.o. [id number] [redacted]w[redacted]d here with complaints of contractions that started at 5:30am. States they are getting more constant and stronger. Feeling it in her back. Was feeling it every 2-3 minutes at home. Took a procardia when she woke up at 5am this morning. Tried to sleep and rest but that didn't work. She also took promethazine for some nausea she was experiencing as well.  +FM, denies LOF, VB, vaginal discharge.   OB History    Gravida Para Term Preterm AB TAB SAB Ectopic Multiple Living   4 3 0 3 0 0 0 0 0 0     -extensive testing unsure why all deliveries end lin FD -according to MFM note suggested 17P and patient declined   Past Medical History  Diagnosis Date  . Ovarian cyst   . Abnormal Pap smear   . PID (pelvic inflammatory disease)   . Gonorrhea   . Trichomonas   . Fibroids   . Uterine fibroid 02/15/2014  . IUFD (intrauterine fetal death)     Past Surgical History  Procedure Laterality Date  . Diagnostic laparoscopy  May 2012  . Laparoscopy N/A 06/26/2012    Procedure: LAPAROSCOPY OPERATIVE with CHROMOPERTUBATION;  Surgeon: Osborne Oman, MD;  Location: [facility];  Service: Gynecology;  Laterality: N/A;  . Salpingoophorectomy Left 06/26/2012    Procedure: SALPINGO OOPHORECTOMY;  Surgeon: Osborne Oman, MD;  Location: [facility];  Service: Gynecology;  Laterality: Left;    Family History  Problem Relation Age of Onset  . Anesthesia problems Neg Hx   . Cystic fibrosis Mother   . Hypertension Mother   . Diabetes Mother     History  Substance Use Topics  . Smoking status: Current Every Day Smoker -- 1.00 packs/day for 10 years    Types: Cigarettes    Last Attempt to Quit: 04/02/2014  . Smokeless tobacco: Never Used  . Alcohol Use: No    Allergies:  Allergies  Allergen Reactions  . Peanut-Containing Drug Products  Anaphylaxis  . Spinach Anaphylaxis  . Nsaids Hives, Itching and Nausea And Vomiting  . Tramadol Itching and Nausea And Vomiting    Prescriptions prior to admission  Medication Sig Dispense Refill Last Dose  . NIFEdipine (PROCARDIA-XL/ADALAT CC) 30 MG 24 hr tablet Take 1 tablet (30 mg total) by mouth 2 (two) times daily. 60 tablet 0 08/25/2014 at Unknown time  . Prenatal Vit-Fe Fumarate-FA (PRENATAL MULTIVITAMIN) TABS tablet Take 1 tablet by mouth daily.    08/24/2014 at Unknown time  . promethazine (PHENERGAN) 12.5 MG tablet Take 1 tablet (12.5 mg total) by mouth every 6 (six) hours as needed for nausea or vomiting. 30 tablet 0 08/25/2014 at Unknown time  . cyclobenzaprine (FLEXERIL) 10 MG tablet Take 1 tablet (10 mg total) by mouth 3 (three) times daily as needed for muscle spasms. (Patient not taking: Reported on 08/25/2014) 10 tablet 0   . EPINEPHrine 0.3 mg/0.3 mL IJ SOAJ injection Inject 0.3 mg into the muscle once as needed (for allergic reaction).   never  . oxyCODONE-acetaminophen (PERCOCET/ROXICET) 5-325 MG per tablet Take 1 tablet by mouth every 4 (four) hours as needed for severe pain. (Patient not taking: Reported on 08/25/2014) 10 tablet 0     Review of Systems  Constitutional: Negative for fever and diaphoresis.  Eyes: Negative for blurred vision and double vision.  Respiratory: Negative for shortness of breath.   Cardiovascular: Negative for chest pain and leg swelling.  Gastrointestinal: Positive for nausea. Negative for vomiting, abdominal pain and constipation.  Genitourinary: Negative for dysuria.  Musculoskeletal: Positive for back pain.  Neurological: Positive for dizziness and headaches.   Physical Exam   Blood pressure 126/72, pulse 103, temperature 98 F (36.7 C), temperature source Oral, resp. rate 18, last menstrual period 12/31/2013.  Physical Exam  General appearance: alert, cooperative and mild distress  Lungs: clear to auscultation bilaterally, normal effort   Heart: regular rate and rhythm  Abdomen: gravid, soft, non-tender to palpation Extremities: no edema  SVE: 0.5/thick/anterior Fetal monitoring: FHR: 140 bpm, variability: moderate, Accelerations: Present, decelerations: Absent  Uterine activity: q 2-3   MAU Course  Procedures - None  MDM: -Procardia x1 >> no change in ctx frequency -terbutaline x1 >> resolution of ctx and pain -s/p IVF -CBC unremarkable and UA with ketones -NST reassuring -SVE unchanged -n/v >> phenergan x1 and reglan x1  Assessment and Plan  Patient is 33 y.o. [id number] [redacted]w[redacted]d reporting abdominal pain likely secondary to preterm contractions w/o cervical change. Pt originally presented with regular contractions q2-48min. Subsided after terbutaline and fluids. Patient no longer endorsed pain and was discharged home. Recommended that patient reconsider 17P treatment.   - fetal kick counts reinforced - preterm labor precautions - Follow-up with OB provider  Luiz Blare, DO 08/25/2014, 12:04 PM PGY-1, [place]  Seen also by me  Agree with note Seabron Spates, CNM

## 2014-08-25 NOTE — Discharge Instructions (Signed)

## 2014-08-25 NOTE — MAU Note (Signed)
Hx of 3 IUFD-2 @ 20 weeks gest and 1 @ [redacted] weeks gestation; hx of PTL with this pregnancy and on Procardia at home; closed on last exam; c/o ucs since 0730 this AM and last Procardia @ 0500 this AM; Rate uc pain @ 10;

## 2014-08-25 NOTE — MAU Note (Signed)
Pt presents complaining of contractions every 2-4 minutes that started at 8am. This is third visit to MAU for contractions. Has procardia and last took at 5am but states it is not working. Denies vaginal bleeding. Thinks she may be leaking. Reports good fetal movement.

## 2014-08-26 ENCOUNTER — Encounter: Payer: 59 | Admitting: Obstetrics and Gynecology

## 2014-08-26 ENCOUNTER — Telehealth: Payer: Self-pay | Admitting: Obstetrics and Gynecology

## 2014-08-26 NOTE — Telephone Encounter (Signed)
Pt called requesting 17P injections as advised by MAU when she was seen yesterday 11/25/14 for c/o abdominal pain and regular contractions (pls see Note). Advised patient that she can discuss this with Dr. Roselie Awkward during her Parental Appointment on 08/31/14 @ 1320. Pt agrees and advised that if she has any change or worsening symptoms to go back to MAU- pt states understanding.

## 2014-08-31 ENCOUNTER — Ambulatory Visit (INDEPENDENT_AMBULATORY_CARE_PROVIDER_SITE_OTHER): Payer: 59 | Admitting: Obstetrics & Gynecology

## 2014-08-31 ENCOUNTER — Encounter: Payer: Self-pay | Admitting: Obstetrics & Gynecology

## 2014-08-31 VITALS — BP 117/61 | HR 103 | Temp 98.4°F | Wt 141.8 lb

## 2014-08-31 DIAGNOSIS — Z23 Encounter for immunization: Secondary | ICD-10-CM | POA: Diagnosis not present

## 2014-08-31 DIAGNOSIS — O0993 Supervision of high risk pregnancy, unspecified, third trimester: Secondary | ICD-10-CM

## 2014-08-31 DIAGNOSIS — F17200 Nicotine dependence, unspecified, uncomplicated: Secondary | ICD-10-CM

## 2014-08-31 LAB — POCT URINALYSIS DIP (DEVICE)
BILIRUBIN URINE: NEGATIVE
Glucose, UA: NEGATIVE mg/dL
HGB URINE DIPSTICK: NEGATIVE
KETONES UR: NEGATIVE mg/dL
Leukocytes, UA: NEGATIVE
Nitrite: NEGATIVE
PH: 6 (ref 5.0–8.0)
Protein, ur: 30 mg/dL — AB
Urobilinogen, UA: 0.2 mg/dL (ref 0.0–1.0)

## 2014-08-31 MED ORDER — TETANUS-DIPHTH-ACELL PERTUSSIS 5-2.5-18.5 LF-MCG/0.5 IM SUSP
0.5000 mL | Freq: Once | INTRAMUSCULAR | Status: AC
  Administered 2014-08-31: 0.5 mL via INTRAMUSCULAR

## 2014-08-31 NOTE — Progress Notes (Signed)
Is concerned re: continued weight loss, has seen nutritionist twice. Is trying to eat 3 meals + 3 small snacks and supplements as able.

## 2014-08-31 NOTE — Patient Instructions (Signed)
Third Trimester of Pregnancy The third trimester is from week 29 through week 42, months 7 through 9. The third trimester is a time when the fetus is growing rapidly. At the end of the ninth month, the fetus is about 20 inches in length and weighs 6-10 pounds.  BODY CHANGES Your body goes through many changes during pregnancy. The changes vary from woman to woman.   Your weight will continue to increase. You can expect to gain 25-35 pounds (11-16 kg) by the end of the pregnancy.  You may begin to get stretch marks on your hips, abdomen, and breasts.  You may urinate more often because the fetus is moving lower into your pelvis and pressing on your bladder.  You may develop or continue to have heartburn as a result of your pregnancy.  You may develop constipation because certain hormones are causing the muscles that push waste through your intestines to slow down.  You may develop hemorrhoids or swollen, bulging veins (varicose veins).  You may have pelvic pain because of the weight gain and pregnancy hormones relaxing your joints between the bones in your pelvis. Backaches may result from overexertion of the muscles supporting your posture.  You may have changes in your hair. These can include thickening of your hair, rapid growth, and changes in texture. Some women also have hair loss during or after pregnancy, or hair that feels dry or thin. Your hair will most likely return to normal after your baby is born.  Your breasts will continue to grow and be tender. A yellow discharge may leak from your breasts called colostrum.  Your belly button may stick out.  You may feel short of breath because of your expanding uterus.  You may notice the fetus "dropping," or moving lower in your abdomen.  You may have a bloody mucus discharge. This usually occurs a few days to a week before labor begins.  Your cervix becomes thin and soft (effaced) near your due date. WHAT TO EXPECT AT YOUR PRENATAL  EXAMS  You will have prenatal exams every 2 weeks until week 36. Then, you will have weekly prenatal exams. During a routine prenatal visit:  You will be weighed to make sure you and the fetus are growing normally.  Your blood pressure is taken.  Your abdomen will be measured to track your baby's growth.  The fetal heartbeat will be listened to.  Any test results from the previous visit will be discussed.  You may have a cervical check near your due date to see if you have effaced. At around 36 weeks, your caregiver will check your cervix. At the same time, your caregiver will also perform a test on the secretions of the vaginal tissue. This test is to determine if a type of bacteria, Group B streptococcus, is present. Your caregiver will explain this further. Your caregiver may ask you:  What your birth plan is.  How you are feeling.  If you are feeling the baby move.  If you have had any abnormal symptoms, such as leaking fluid, bleeding, severe headaches, or abdominal cramping.  If you have any questions. Other tests or screenings that may be performed during your third trimester include:  Blood tests that check for low iron levels (anemia).  Fetal testing to check the health, activity level, and growth of the fetus. Testing is done if you have certain medical conditions or if there are problems during the pregnancy. FALSE LABOR You may feel small, irregular contractions that   eventually go away. These are called Braxton Hicks contractions, or false labor. Contractions may last for hours, days, or even weeks before true labor sets in. If contractions come at regular intervals, intensify, or become painful, it is best to be seen by your caregiver.  SIGNS OF LABOR   Menstrual-like cramps.  Contractions that are 5 minutes apart or less.  Contractions that start on the top of the uterus and spread down to the lower abdomen and back.  A sense of increased pelvic pressure or back  pain.  A watery or bloody mucus discharge that comes from the vagina. If you have any of these signs before the 37th week of pregnancy, call your caregiver right away. You need to go to the hospital to get checked immediately. HOME CARE INSTRUCTIONS   Avoid all smoking, herbs, alcohol, and unprescribed drugs. These chemicals affect the formation and growth of the baby.  Follow your caregiver's instructions regarding medicine use. There are medicines that are either safe or unsafe to take during pregnancy.  Exercise only as directed by your caregiver. Experiencing uterine cramps is a good sign to stop exercising.  Continue to eat regular, healthy meals.  Wear a good support bra for breast tenderness.  Do not use hot tubs, steam rooms, or saunas.  Wear your seat belt at all times when driving.  Avoid raw meat, uncooked cheese, cat litter boxes, and soil used by cats. These carry germs that can cause birth defects in the baby.  Take your prenatal vitamins.  Try taking a stool softener (if your caregiver approves) if you develop constipation. Eat more high-fiber foods, such as fresh vegetables or fruit and whole grains. Drink plenty of fluids to keep your urine clear or pale yellow.  Take warm sitz baths to soothe any pain or discomfort caused by hemorrhoids. Use hemorrhoid cream if your caregiver approves.  If you develop varicose veins, wear support hose. Elevate your feet for 15 minutes, 3-4 times a day. Limit salt in your diet.  Avoid heavy lifting, wear low heal shoes, and practice good posture.  Rest a lot with your legs elevated if you have leg cramps or low back pain.  Visit your dentist if you have not gone during your pregnancy. Use a soft toothbrush to brush your teeth and be gentle when you floss.  A sexual relationship may be continued unless your caregiver directs you otherwise.  Do not travel far distances unless it is absolutely necessary and only with the approval  of your caregiver.  Take prenatal classes to understand, practice, and ask questions about the labor and delivery.  Make a trial run to the hospital.  Pack your hospital bag.  Prepare the baby's nursery.  Continue to go to all your prenatal visits as directed by your caregiver. SEEK MEDICAL CARE IF:  You are unsure if you are in labor or if your water has broken.  You have dizziness.  You have mild pelvic cramps, pelvic pressure, or nagging pain in your abdominal area.  You have persistent nausea, vomiting, or diarrhea.  You have a bad smelling vaginal discharge.  You have pain with urination. SEEK IMMEDIATE MEDICAL CARE IF:   You have a fever.  You are leaking fluid from your vagina.  You have spotting or bleeding from your vagina.  You have severe abdominal cramping or pain.  You have rapid weight loss or gain.  You have shortness of breath with chest pain.  You notice sudden or extreme swelling   of your face, hands, ankles, feet, or legs.  You have not felt your baby move in over an hour.  You have severe headaches that do not go away with medicine.  You have vision changes. Document Released: 03/13/2001 Document Revised: 03/24/2013 Document Reviewed: 05/20/2012 Exit[employer] Patient Information 2015 [employer]. This information is not intended to replace advice given to you by your health care provider. Make sure you discuss any questions you have with your health care provider.  

## 2014-08-31 NOTE — Progress Notes (Signed)
No problems, had nl growth [place] 5/19, will begin weekly BPP tomorrow

## 2014-09-01 LAB — GLUCOSE TOLERANCE, 1 HOUR (50G) W/O FASTING: Glucose, 1 Hour GTT: 127 mg/dL (ref 70–140)

## 2014-09-01 LAB — RPR

## 2014-09-09 ENCOUNTER — Ambulatory Visit (HOSPITAL_COMMUNITY)
Admission: RE | Admit: 2014-09-09 | Discharge: 2014-09-09 | Disposition: A | Discharge status: Home / Self Care | Payer: 59 | Source: Ambulatory Visit | Attending: Obstetrics and Gynecology | Admitting: Obstetrics and Gynecology

## 2014-09-09 ENCOUNTER — Encounter (HOSPITAL_COMMUNITY): Payer: Self-pay

## 2014-09-09 DIAGNOSIS — O99323 Drug use complicating pregnancy, third trimester: Secondary | ICD-10-CM | POA: Diagnosis not present

## 2014-09-09 DIAGNOSIS — O09293 Supervision of pregnancy with other poor reproductive or obstetric history, third trimester: Secondary | ICD-10-CM | POA: Diagnosis not present

## 2014-09-09 DIAGNOSIS — Z3A29 29 weeks gestation of pregnancy: Secondary | ICD-10-CM | POA: Insufficient documentation

## 2014-09-09 DIAGNOSIS — O09299 Supervision of pregnancy with other poor reproductive or obstetric history, unspecified trimester: Secondary | ICD-10-CM

## 2014-09-13 NOTE — Discharge Summary (Shared)
Physician Discharge Summary  Patient ID: [name] [name] MRN: [id number] DOB/AGE: 34-Jun-1982 34 y.o.  Admit date: 08/06/2014 Discharge date: 08/07/14  Admission Diagnoses:  Discharge Diagnoses:  Active Problems:   Supervision of high risk pregnancy, antepartum   Prior pregnancy with fetal demise and current pregnancy   Discharged Condition: {condition:18240}  Hospital Course:  HPI: [name] [name] is a 34 y.o. G4P0300 at 58w4dwho presents to maternity admissions reporting contractions described as tightening and cramping intermittently since 2 am, worsening over the course of the day. She is breathing through contractions upon arrival in [facility]U. She has poor obstetric hx with fetal demise x 3, related to abruption. She reports good fetal movement, denies LOF, vaginal bleeding, vaginal itching/burning, urinary symptoms, h/a, dizziness, n/v, or fever/chills. Prenatal History/Complications: 3 secondary losses secondary to placental abruption's. Additionally the patient has 2 uterine fibroids which are 5.9 x 5.7 x 5.37 cm and 2.9 x 3.4 x 1.9 cm in size. These are not obstructing the uterine cavity or uterine lower uterine segment.. Consults: None  Significant Diagnostic Studies: labs: FFN negative, radiology: Ultrasound: singleton with normal fluid, good cervix length. and nuclear medicine: n/a  Treatments: tocolytics,   Discharge Exam: Blood pressure 118/65, pulse 96, temperature 98.6 F (37 C), temperature source Oral, resp. rate 18, height 5\' 1"  (1.549 m), weight 146 lb (66.225 kg), last menstrual period 12/31/2013, SpO2 95 %. General appearance: alert, cooperative, fatigued and mild distress Head: Normocephalic, without obvious abnormality, atraumatic Resp: normal GI: soft, non-tender; bowel sounds normal; no masses,  no organomegaly and gravid uterus nontender Pelvic Not repeated. Disposition: 01-Home or Self Care  Discharge Instructions    Call MD for:    Complete by:  As  directed   Resumed uterine contractions     Diet - low sodium heart healthy    Complete by:  As directed      Discharge instructions    Complete by:  As directed   Continue procardia thru the weekend     Increase activity slowly    Complete by:  As directed             Medication List    STOP taking these medications        EPINEPHrine 0.3 mg/0.3 mL Soaj injection  Commonly known as:  EPI-PEN      TAKE these medications        prenatal multivitamin Tabs tablet  Take 1 tablet by mouth daily.     promethazine 12.5 MG tablet  Commonly known as:  PHENERGAN  Take 1 tablet (12.5 mg total) by mouth every 6 (six) hours as needed for nausea or vomiting.           Follow-up Information    Follow up with [facility] In 2 weeks.   Why:  If symptoms worsen, As needed   Contact information:   [address] [id number] [place] [phone]      Follow up In 2 weeks.      SignedJonnie Kind 09/13/2014, 8:49 PM

## 2014-09-14 ENCOUNTER — Ambulatory Visit (INDEPENDENT_AMBULATORY_CARE_PROVIDER_SITE_OTHER): Payer: 59 | Admitting: Obstetrics & Gynecology

## 2014-09-14 ENCOUNTER — Other Ambulatory Visit: Payer: Self-pay | Admitting: Obstetrics & Gynecology

## 2014-09-14 VITALS — BP 111/69 | HR 97 | Temp 98.7°F | Wt 140.7 lb

## 2014-09-14 DIAGNOSIS — O26893 Other specified pregnancy related conditions, third trimester: Secondary | ICD-10-CM

## 2014-09-14 DIAGNOSIS — O9989 Other specified diseases and conditions complicating pregnancy, childbirth and the puerperium: Secondary | ICD-10-CM | POA: Diagnosis not present

## 2014-09-14 DIAGNOSIS — O0993 Supervision of high risk pregnancy, unspecified, third trimester: Secondary | ICD-10-CM

## 2014-09-14 DIAGNOSIS — O09293 Supervision of pregnancy with other poor reproductive or obstetric history, third trimester: Secondary | ICD-10-CM

## 2014-09-14 DIAGNOSIS — R102 Pelvic and perineal pain: Secondary | ICD-10-CM

## 2014-09-14 DIAGNOSIS — O26899 Other specified pregnancy related conditions, unspecified trimester: Secondary | ICD-10-CM

## 2014-09-14 DIAGNOSIS — N898 Other specified noninflammatory disorders of vagina: Secondary | ICD-10-CM

## 2014-09-14 LAB — POCT URINALYSIS DIP (DEVICE)
Glucose, UA: NEGATIVE mg/dL
HGB URINE DIPSTICK: NEGATIVE
KETONES UR: 15 mg/dL — AB
Leukocytes, UA: NEGATIVE
Nitrite: NEGATIVE
PH: 7 (ref 5.0–8.0)
PROTEIN: 30 mg/dL — AB
Specific Gravity, Urine: 1.02 (ref 1.005–1.030)
Urobilinogen, UA: 1 mg/dL (ref 0.0–1.0)

## 2014-09-14 NOTE — Progress Notes (Signed)
Pain/pressure- pelvic and vaginal area  Vaginal discharge- itchiness, white with mucous

## 2014-09-14 NOTE — Patient Instructions (Signed)
Return to clinic for any obstetric concerns or go to MAU for evaluation  

## 2014-09-14 NOTE — Progress Notes (Addendum)
Subjective:  [name] [name] is a 34 y.o. G4P0300 at [redacted]w[redacted]d being seen today for ongoing prenatal care.  Patient reports vaginal irritation and white-green thick vaginal discharge for last two days.  Contractions: Irregular.  Vag. Bleeding: None. Movement: Present. Denies leaking of fluid.   The following portions of the patient's history were reviewed and updated as appropriate: allergies, current medications, past family history, past medical history, past social history, past surgical history and problem list.   Objective:   Filed Vitals:   09/14/14 1328  BP: 111/69  Pulse: 97  Temp: 98.7 F (37.1 C)  Weight: 140 lb 11.2 oz (63.821 kg)    Fetal Status: Fetal Heart Rate (bpm): 127 Fundal Height: 32 cm Movement: Present     General:  Alert, oriented and cooperative. Patient is in no acute distress.  Skin: Skin is warm and dry. No rash noted.   Cardiovascular: Normal heart rate noted  Respiratory: Effort and breath sounds normal, no problems with respiration noted  Abdomen: Soft, gravid, appropriate for gestational age. Pain/Pressure: Present     Vaginal: Vag. Bleeding: None.    Vag D/C Character: Thick, white discharge seen. Pelvic cultures obtained.  Cervix: Dilation: Closed Effacement (%): Thick Station: Ballotable  Extremities: Normal range of motion.  Edema: None  Mental Status: Normal mood and affect. Normal behavior. Normal judgment and thought content.   Urinalysis: Urine Protein: 1+ Urine Glucose: Negative  Assessment and Plan:  Pregnancy: G4P0300 at [redacted]w[redacted]d  1. Vaginal discharge during pregnancy in third trimester - Wet prep, genital - GC/Chlamydia Probe Amp Will follow up results and manage accordingly.  2. Prior pregnancy with fetal demise and current pregnancy, third trimester  3. Pelvic pain affecting pregnancy  4. Supervision of high risk pregnancy, antepartum, third trimester Preterm labor symptoms and general obstetric precautions including but not limited  to vaginal bleeding, contractions, leaking of fluid and fetal movement were reviewed in detail with the patient. Continue Procardia as needed for preterm contractions. Please refer to After Visit Summary for other counseling recommendations.   Return in about 2 weeks (around 09/28/2014) for OB visit.   Osborne Oman, MD

## 2014-09-15 ENCOUNTER — Other Ambulatory Visit (HOSPITAL_COMMUNITY): Payer: Self-pay | Admitting: Maternal and Fetal Medicine

## 2014-09-15 DIAGNOSIS — O09299 Supervision of pregnancy with other poor reproductive or obstetric history, unspecified trimester: Secondary | ICD-10-CM

## 2014-09-15 DIAGNOSIS — O99323 Drug use complicating pregnancy, third trimester: Secondary | ICD-10-CM

## 2014-09-15 LAB — WET PREP, GENITAL: TRICH WET PREP: NONE SEEN

## 2014-09-15 LAB — GC/CHLAMYDIA PROBE AMP
CT PROBE, AMP APTIMA: NEGATIVE
GC Probe RNA: NEGATIVE

## 2014-09-16 ENCOUNTER — Ambulatory Visit (HOSPITAL_COMMUNITY)
Admission: RE | Admit: 2014-09-16 | Discharge: 2014-09-16 | Disposition: A | Discharge status: Home / Self Care | Payer: 59 | Source: Ambulatory Visit | Attending: Obstetrics and Gynecology | Admitting: Obstetrics and Gynecology

## 2014-09-16 ENCOUNTER — Encounter (HOSPITAL_COMMUNITY): Payer: Self-pay

## 2014-09-16 DIAGNOSIS — D259 Leiomyoma of uterus, unspecified: Secondary | ICD-10-CM | POA: Diagnosis not present

## 2014-09-16 DIAGNOSIS — O09299 Supervision of pregnancy with other poor reproductive or obstetric history, unspecified trimester: Secondary | ICD-10-CM

## 2014-09-16 DIAGNOSIS — O3413 Maternal care for benign tumor of corpus uteri, third trimester: Secondary | ICD-10-CM | POA: Diagnosis not present

## 2014-09-16 DIAGNOSIS — O09293 Supervision of pregnancy with other poor reproductive or obstetric history, third trimester: Secondary | ICD-10-CM | POA: Diagnosis not present

## 2014-09-16 DIAGNOSIS — O99323 Drug use complicating pregnancy, third trimester: Secondary | ICD-10-CM

## 2014-09-16 DIAGNOSIS — Z3A30 30 weeks gestation of pregnancy: Secondary | ICD-10-CM | POA: Diagnosis not present

## 2014-09-16 MED ORDER — METRONIDAZOLE 500 MG PO TABS: 500.0000 mg | ORAL_TABLET | Freq: Two times a day (BID) | ORAL | Status: DC

## 2014-09-16 MED ORDER — CLOTRIMAZOLE 1 % VA CREA: 1.0000 | TOPICAL_CREAM | Freq: Every day | VAGINAL | Status: DC

## 2014-09-16 NOTE — Addendum Note (Signed)
Addended by: Verita Schneiders A on: 09/16/2014 06:56 PM   Modules accepted: Orders

## 2014-09-17 ENCOUNTER — Telehealth: Payer: Self-pay | Admitting: *Deleted

## 2014-09-17 NOTE — Telephone Encounter (Signed)
Called [facility] and notified her of BV and yeast and that prescriptions are sent to her pharmacy. She voiced understanding.

## 2014-09-17 NOTE — Telephone Encounter (Deleted)
-----   Message from Osborne Oman, MD sent at 09/16/2014  6:57 PM EDT ----- Clotrimazole cream and Metronidazole prescribed.  Please call to inform patient of results and advise her to pick up prescription.

## 2014-09-17 NOTE — Telephone Encounter (Signed)
Need to call patient and notify of prescriptions sent to her pharmacy for BV and yeast per Dr. Arther Abbott message.

## 2014-09-23 ENCOUNTER — Ambulatory Visit (HOSPITAL_COMMUNITY)
Admission: RE | Admit: 2014-09-23 | Discharge: 2014-09-23 | Disposition: A | Discharge status: Home / Self Care | Payer: 59 | Source: Ambulatory Visit | Attending: Obstetrics and Gynecology | Admitting: Obstetrics and Gynecology

## 2014-09-23 DIAGNOSIS — O368130 Decreased fetal movements, third trimester, not applicable or unspecified: Secondary | ICD-10-CM | POA: Diagnosis not present

## 2014-09-23 DIAGNOSIS — D259 Leiomyoma of uterus, unspecified: Secondary | ICD-10-CM | POA: Insufficient documentation

## 2014-09-23 DIAGNOSIS — O99323 Drug use complicating pregnancy, third trimester: Secondary | ICD-10-CM

## 2014-09-23 DIAGNOSIS — O09293 Supervision of pregnancy with other poor reproductive or obstetric history, third trimester: Secondary | ICD-10-CM | POA: Diagnosis not present

## 2014-09-23 DIAGNOSIS — O3413 Maternal care for benign tumor of corpus uteri, third trimester: Secondary | ICD-10-CM | POA: Diagnosis not present

## 2014-09-23 DIAGNOSIS — Z3A31 31 weeks gestation of pregnancy: Secondary | ICD-10-CM | POA: Diagnosis not present

## 2014-09-23 DIAGNOSIS — O09299 Supervision of pregnancy with other poor reproductive or obstetric history, unspecified trimester: Secondary | ICD-10-CM

## 2014-09-28 ENCOUNTER — Encounter: Payer: 59 | Admitting: Obstetrics & Gynecology

## 2014-09-28 ENCOUNTER — Telehealth: Payer: Self-pay | Admitting: *Deleted

## 2014-09-28 MED ORDER — PROMETHAZINE HCL 12.5 MG PO TABS: 12.5000 mg | ORAL_TABLET | Freq: Four times a day (QID) | ORAL | Status: DC | PRN

## 2014-09-28 NOTE — Telephone Encounter (Signed)
Patient called to request a refill on her phenergen. Verbal order given to refill by Manya Silvas, CNM.

## 2014-09-30 ENCOUNTER — Encounter (HOSPITAL_COMMUNITY): Payer: Self-pay

## 2014-09-30 ENCOUNTER — Ambulatory Visit (HOSPITAL_COMMUNITY)
Admission: RE | Admit: 2014-09-30 | Discharge: 2014-09-30 | Disposition: A | Discharge status: Home / Self Care | Payer: 59 | Source: Ambulatory Visit | Attending: Obstetrics and Gynecology | Admitting: Obstetrics and Gynecology

## 2014-09-30 DIAGNOSIS — F1290 Cannabis use, unspecified, uncomplicated: Secondary | ICD-10-CM | POA: Insufficient documentation

## 2014-09-30 DIAGNOSIS — Z3A32 32 weeks gestation of pregnancy: Secondary | ICD-10-CM | POA: Diagnosis not present

## 2014-09-30 DIAGNOSIS — O368130 Decreased fetal movements, third trimester, not applicable or unspecified: Secondary | ICD-10-CM | POA: Insufficient documentation

## 2014-09-30 DIAGNOSIS — O99323 Drug use complicating pregnancy, third trimester: Secondary | ICD-10-CM | POA: Diagnosis not present

## 2014-09-30 DIAGNOSIS — O09293 Supervision of pregnancy with other poor reproductive or obstetric history, third trimester: Secondary | ICD-10-CM | POA: Insufficient documentation

## 2014-09-30 DIAGNOSIS — O409XX0 Polyhydramnios, unspecified trimester, not applicable or unspecified: Secondary | ICD-10-CM | POA: Insufficient documentation

## 2014-09-30 DIAGNOSIS — O09299 Supervision of pregnancy with other poor reproductive or obstetric history, unspecified trimester: Secondary | ICD-10-CM

## 2014-10-05 ENCOUNTER — Ambulatory Visit (INDEPENDENT_AMBULATORY_CARE_PROVIDER_SITE_OTHER): Payer: 59 | Admitting: Obstetrics & Gynecology

## 2014-10-05 ENCOUNTER — Encounter: Payer: Self-pay | Admitting: Obstetrics & Gynecology

## 2014-10-05 VITALS — BP 119/64 | HR 105 | Temp 98.7°F | Wt 145.0 lb

## 2014-10-05 DIAGNOSIS — O09293 Supervision of pregnancy with other poor reproductive or obstetric history, third trimester: Secondary | ICD-10-CM | POA: Diagnosis not present

## 2014-10-05 DIAGNOSIS — O403XX1 Polyhydramnios, third trimester, fetus 1: Secondary | ICD-10-CM

## 2014-10-05 DIAGNOSIS — O0993 Supervision of high risk pregnancy, unspecified, third trimester: Secondary | ICD-10-CM

## 2014-10-05 LAB — POCT URINALYSIS DIP (DEVICE)
Bilirubin Urine: NEGATIVE
GLUCOSE, UA: 100 mg/dL — AB
Hgb urine dipstick: NEGATIVE
Ketones, ur: NEGATIVE mg/dL
LEUKOCYTES UA: NEGATIVE
NITRITE: NEGATIVE
Protein, ur: 30 mg/dL — AB
SPECIFIC GRAVITY, URINE: 1.02 (ref 1.005–1.030)
UROBILINOGEN UA: 1 mg/dL (ref 0.0–1.0)
pH: 6.5 (ref 5.0–8.0)

## 2014-10-05 NOTE — Progress Notes (Signed)
C/O right arm going numb.  Tip of the week reviewed with patient.  Concerned about increased fluid around the baby.  MFM talked to her about this.  Is concerned because 1 hour GTT normal - wants to know what could be causing it. Pt also reports having a headache x3 days which is not relieved by Tylenol - only took 1 dose.  Pt advised to try again and may require additional doses in order to decrease pain, then take prn. Pt has weekly BPP scheduled  @ [facility] on fridays.  Diane Day RNC

## 2014-10-05 NOTE — Patient Instructions (Signed)
Third Trimester of Pregnancy The third trimester is from week 29 through week 42, months 7 through 9. The third trimester is a time when the fetus is growing rapidly. At the end of the ninth month, the fetus is about 20 inches in length and weighs 6-10 pounds.  BODY CHANGES Your body goes through many changes during pregnancy. The changes vary from woman to woman.   Your weight will continue to increase. You can expect to gain 25-35 pounds (11-16 kg) by the end of the pregnancy.  You may begin to get stretch marks on your hips, abdomen, and breasts.  You may urinate more often because the fetus is moving lower into your pelvis and pressing on your bladder.  You may develop or continue to have heartburn as a result of your pregnancy.  You may develop constipation because certain hormones are causing the muscles that push waste through your intestines to slow down.  You may develop hemorrhoids or swollen, bulging veins (varicose veins).  You may have pelvic pain because of the weight gain and pregnancy hormones relaxing your joints between the bones in your pelvis. Backaches may result from overexertion of the muscles supporting your posture.  You may have changes in your hair. These can include thickening of your hair, rapid growth, and changes in texture. Some women also have hair loss during or after pregnancy, or hair that feels dry or thin. Your hair will most likely return to normal after your baby is born.  Your breasts will continue to grow and be tender. A yellow discharge may leak from your breasts called colostrum.  Your belly button may stick out.  You may feel short of breath because of your expanding uterus.  You may notice the fetus "dropping," or moving lower in your abdomen.  You may have a bloody mucus discharge. This usually occurs a few days to a week before labor begins.  Your cervix becomes thin and soft (effaced) near your due date. WHAT TO EXPECT AT YOUR PRENATAL  EXAMS  You will have prenatal exams every 2 weeks until week 36. Then, you will have weekly prenatal exams. During a routine prenatal visit:  You will be weighed to make sure you and the fetus are growing normally.  Your blood pressure is taken.  Your abdomen will be measured to track your baby's growth.  The fetal heartbeat will be listened to.  Any test results from the previous visit will be discussed.  You may have a cervical check near your due date to see if you have effaced. At around 36 weeks, your caregiver will check your cervix. At the same time, your caregiver will also perform a test on the secretions of the vaginal tissue. This test is to determine if a type of bacteria, Group B streptococcus, is present. Your caregiver will explain this further. Your caregiver may ask you:  What your birth plan is.  How you are feeling.  If you are feeling the baby move.  If you have had any abnormal symptoms, such as leaking fluid, bleeding, severe headaches, or abdominal cramping.  If you have any questions. Other tests or screenings that may be performed during your third trimester include:  Blood tests that check for low iron levels (anemia).  Fetal testing to check the health, activity level, and growth of the fetus. Testing is done if you have certain medical conditions or if there are problems during the pregnancy. FALSE LABOR You may feel small, irregular contractions that   eventually go away. These are called Braxton Hicks contractions, or false labor. Contractions may last for hours, days, or even weeks before true labor sets in. If contractions come at regular intervals, intensify, or become painful, it is best to be seen by your caregiver.  SIGNS OF LABOR   Menstrual-like cramps.  Contractions that are 5 minutes apart or less.  Contractions that start on the top of the uterus and spread down to the lower abdomen and back.  A sense of increased pelvic pressure or back  pain.  A watery or bloody mucus discharge that comes from the vagina. If you have any of these signs before the 37th week of pregnancy, call your caregiver right away. You need to go to the hospital to get checked immediately. HOME CARE INSTRUCTIONS   Avoid all smoking, herbs, alcohol, and unprescribed drugs. These chemicals affect the formation and growth of the baby.  Follow your caregiver's instructions regarding medicine use. There are medicines that are either safe or unsafe to take during pregnancy.  Exercise only as directed by your caregiver. Experiencing uterine cramps is a good sign to stop exercising.  Continue to eat regular, healthy meals.  Wear a good support bra for breast tenderness.  Do not use hot tubs, steam rooms, or saunas.  Wear your seat belt at all times when driving.  Avoid raw meat, uncooked cheese, cat litter boxes, and soil used by cats. These carry germs that can cause birth defects in the baby.  Take your prenatal vitamins.  Try taking a stool softener (if your caregiver approves) if you develop constipation. Eat more high-fiber foods, such as fresh vegetables or fruit and whole grains. Drink plenty of fluids to keep your urine clear or pale yellow.  Take warm sitz baths to soothe any pain or discomfort caused by hemorrhoids. Use hemorrhoid cream if your caregiver approves.  If you develop varicose veins, wear support hose. Elevate your feet for 15 minutes, 3-4 times a day. Limit salt in your diet.  Avoid heavy lifting, wear low heal shoes, and practice good posture.  Rest a lot with your legs elevated if you have leg cramps or low back pain.  Visit your dentist if you have not gone during your pregnancy. Use a soft toothbrush to brush your teeth and be gentle when you floss.  A sexual relationship may be continued unless your caregiver directs you otherwise.  Do not travel far distances unless it is absolutely necessary and only with the approval  of your caregiver.  Take prenatal classes to understand, practice, and ask questions about the labor and delivery.  Make a trial run to the hospital.  Pack your hospital bag.  Prepare the baby's nursery.  Continue to go to all your prenatal visits as directed by your caregiver. SEEK MEDICAL CARE IF:  You are unsure if you are in labor or if your water has broken.  You have dizziness.  You have mild pelvic cramps, pelvic pressure, or nagging pain in your abdominal area.  You have persistent nausea, vomiting, or diarrhea.  You have a bad smelling vaginal discharge.  You have pain with urination. SEEK IMMEDIATE MEDICAL CARE IF:   You have a fever.  You are leaking fluid from your vagina.  You have spotting or bleeding from your vagina.  You have severe abdominal cramping or pain.  You have rapid weight loss or gain.  You have shortness of breath with chest pain.  You notice sudden or extreme swelling   of your face, hands, ankles, feet, or legs.  You have not felt your baby move in over an hour.  You have severe headaches that do not go away with medicine.  You have vision changes. Document Released: 03/13/2001 Document Revised: 03/24/2013 Document Reviewed: 05/20/2012 Exit[employer] Patient Information 2015 [employer]. This information is not intended to replace advice given to you by your health care provider. Make sure you discuss any questions you have with your health care provider.  

## 2014-10-05 NOTE — Progress Notes (Signed)
Subjective:  [name] [name] is a 34 y.o. G4P0300 at [redacted]w[redacted]d being seen today for ongoing prenatal care.  Patient reports headache. She reports taking Tylenol only once. She also c/o numbness in her hands bilaterally.  Contractions: Irregular.  Vag. Bleeding: None. Movement: Present. Denies leaking of fluid.   The following portions of the patient's history were reviewed and updated as appropriate: allergies, current medications, past family history, past medical history, past social history, past surgical history and problem list.   Objective:   Filed Vitals:   10/05/14 1242  BP: 119/64  Pulse: 105  Temp: 98.7 F (37.1 C)  Weight: 145 lb (65.772 kg)    Fetal Status: Fetal Heart Rate (bpm): NST   Movement: Present     General:  Alert, oriented and cooperative. Patient is in no acute distress.  Skin: Skin is warm and dry. No rash noted.   Cardiovascular: Normal heart rate noted  Respiratory: Normal respiratory effort, no problems with respiration noted  Abdomen: Soft, gravid, appropriate for gestational age. Pain/Pressure: Present     Vaginal: Vag. Bleeding: None.    Vag D/C Character: White  Cervix: visually long and closed        Extremities: Normal range of motion.  Edema: Mild pitting, slight indentation  Mental Status: Normal mood and affect. Normal behavior. Normal judgment and thought content.   NST reviewed and reactive.  Jaeshaun Riva L. Harraway-Smith, M.D., FACOG    Urinalysis: Urine Protein: 1+ Urine Glucose: 2+  Assessment and Plan:  Pregnancy: G4P0300 at [redacted]w[redacted]d  1. History of intrauterine fetal death, currently pregnant, third trimester  - Fetal nonstress test.   2. Polyhydramnios, third trimester, fetus 1 S>D - Fetal nonstress test  Preterm labor symptoms and general obstetric precautions including but not limited to vaginal bleeding, contractions, leaking of fluid and fetal movement were reviewed in detail with the patient.  Please refer to After Visit  Summary for other counseling recommendations.   Return in about 2 weeks (around 10/19/2014) for Ob fu and NST scheduled.   Lavonia Drafts, MD

## 2014-10-07 ENCOUNTER — Other Ambulatory Visit (HOSPITAL_COMMUNITY): Payer: Self-pay | Admitting: Maternal and Fetal Medicine

## 2014-10-07 DIAGNOSIS — D259 Leiomyoma of uterus, unspecified: Secondary | ICD-10-CM

## 2014-10-07 DIAGNOSIS — O368130 Decreased fetal movements, third trimester, not applicable or unspecified: Secondary | ICD-10-CM

## 2014-10-07 DIAGNOSIS — O3413 Maternal care for benign tumor of corpus uteri, third trimester: Secondary | ICD-10-CM

## 2014-10-07 DIAGNOSIS — Z3A33 33 weeks gestation of pregnancy: Secondary | ICD-10-CM

## 2014-10-07 DIAGNOSIS — Z3A34 34 weeks gestation of pregnancy: Secondary | ICD-10-CM

## 2014-10-07 DIAGNOSIS — O09299 Supervision of pregnancy with other poor reproductive or obstetric history, unspecified trimester: Secondary | ICD-10-CM

## 2014-10-07 DIAGNOSIS — O99323 Drug use complicating pregnancy, third trimester: Secondary | ICD-10-CM

## 2014-10-08 ENCOUNTER — Ambulatory Visit (HOSPITAL_COMMUNITY)
Admission: RE | Admit: 2014-10-08 | Discharge: 2014-10-08 | Disposition: A | Discharge status: Home / Self Care | Payer: 59 | Source: Ambulatory Visit | Attending: Obstetrics & Gynecology | Admitting: Obstetrics & Gynecology

## 2014-10-08 DIAGNOSIS — O3413 Maternal care for benign tumor of corpus uteri, third trimester: Secondary | ICD-10-CM | POA: Diagnosis not present

## 2014-10-08 DIAGNOSIS — O368130 Decreased fetal movements, third trimester, not applicable or unspecified: Secondary | ICD-10-CM | POA: Insufficient documentation

## 2014-10-08 DIAGNOSIS — O09291 Supervision of pregnancy with other poor reproductive or obstetric history, first trimester: Secondary | ICD-10-CM | POA: Insufficient documentation

## 2014-10-08 DIAGNOSIS — Z3A33 33 weeks gestation of pregnancy: Secondary | ICD-10-CM | POA: Insufficient documentation

## 2014-10-08 DIAGNOSIS — O99323 Drug use complicating pregnancy, third trimester: Secondary | ICD-10-CM

## 2014-10-08 DIAGNOSIS — D259 Leiomyoma of uterus, unspecified: Secondary | ICD-10-CM | POA: Insufficient documentation

## 2014-10-08 DIAGNOSIS — O09299 Supervision of pregnancy with other poor reproductive or obstetric history, unspecified trimester: Secondary | ICD-10-CM | POA: Insufficient documentation

## 2014-10-09 ENCOUNTER — Encounter (HOSPITAL_COMMUNITY): Payer: Self-pay | Admitting: *Deleted

## 2014-10-09 ENCOUNTER — Inpatient Hospital Stay (HOSPITAL_COMMUNITY)
Admission: AD | Admit: 2014-10-09 | Discharge: 2014-10-10 | Disposition: A | Discharge status: Home / Self Care | Payer: 59 | Source: Ambulatory Visit | Attending: Obstetrics and Gynecology | Admitting: Obstetrics and Gynecology

## 2014-10-09 DIAGNOSIS — Z3A33 33 weeks gestation of pregnancy: Secondary | ICD-10-CM | POA: Insufficient documentation

## 2014-10-09 DIAGNOSIS — O479 False labor, unspecified: Secondary | ICD-10-CM

## 2014-10-09 NOTE — MAU Note (Signed)
Pt stated she started having ctx around 8pm tonight.  About 5-10 min apart now, has had preterm labor with this pregnancy has prn procardia but did not take it tonight becuase she was out and by the time she got home they were so close together she came to hospital instead.

## 2014-10-09 NOTE — MAU Provider Note (Signed)
History     CSN: [id number]  Arrival date and time: 10/09/14 2315   None     Chief Complaint  Patient presents with  . Contractions   HPI  Patient is 34 y.o. [id number] [redacted]w[redacted]d here with complaints of preterm contractions.  Takes procardia as needed, has not taken in a while.  Contractions started earlier today, last contraction was 2 hours ago.  Now only having pelvic pressure, has had previously and it resolved with "procardia, or whatever they gave me"  +FM, denies LOF, VB, vaginal discharge.     Past Medical History  Diagnosis Date  . Ovarian cyst   . Abnormal Pap smear   . PID (pelvic inflammatory disease)   . Gonorrhea   . Trichomonas   . Fibroids   . Uterine fibroid 02/15/2014  . IUFD (intrauterine fetal death)     Past Surgical History  Procedure Laterality Date  . Diagnostic laparoscopy  May 2012  . Laparoscopy N/A 06/26/2012    Procedure: LAPAROSCOPY OPERATIVE with CHROMOPERTUBATION;  Surgeon: Osborne Oman, MD;  Location: [facility];  Service: Gynecology;  Laterality: N/A;  . Salpingoophorectomy Left 06/26/2012    Procedure: SALPINGO OOPHORECTOMY;  Surgeon: Osborne Oman, MD;  Location: [facility];  Service: Gynecology;  Laterality: Left;    Family History  Problem Relation Age of Onset  . Anesthesia problems Neg Hx   . Cystic fibrosis Mother   . Hypertension Mother   . Diabetes Mother     History  Substance Use Topics  . Smoking status: Current Every Day Smoker -- 1.00 packs/day for 10 years    Types: Cigarettes    Last Attempt to Quit: 04/02/2014  . Smokeless tobacco: Never Used  . Alcohol Use: No    Allergies:  Allergies  Allergen Reactions  . Peanut-Containing Drug Products Anaphylaxis  . Spinach Anaphylaxis  . Nsaids Hives, Itching and Nausea And Vomiting  . Tramadol Itching and Nausea And Vomiting    Prescriptions prior to admission  Medication Sig Dispense Refill Last Dose  . clotrimazole (GYNE-LOTRIMIN) 1 % vaginal cream Place 1  Applicatorful vaginally at bedtime. 30 g 2 Taking  . EPINEPHrine 0.3 mg/0.3 mL IJ SOAJ injection Inject 0.3 mg into the muscle once as needed (for allergic reaction).   Taking  . metroNIDAZOLE (FLAGYL) 500 MG tablet Take 1 tablet (500 mg total) by mouth 2 (two) times daily. (Patient not taking: Reported on 09/30/2014) 14 tablet 0 Not Taking  . NIFEdipine (PROCARDIA-XL/ADALAT CC) 30 MG 24 hr tablet Take 1 tablet (30 mg total) by mouth 2 (two) times daily. 60 tablet 0 Taking  . Prenatal Vit-Fe Fumarate-FA (PRENATAL MULTIVITAMIN) TABS tablet Take 1 tablet by mouth daily.    Taking  . promethazine (PHENERGAN) 12.5 MG tablet Take 1 tablet (12.5 mg total) by mouth every 6 (six) hours as needed for nausea or vomiting. 30 tablet 1 Taking    Review of Systems  Constitutional: Negative for fever and chills.  HENT: Negative for congestion.   Respiratory: Negative for cough and shortness of breath.   Cardiovascular: Negative for chest pain and leg swelling.  Gastrointestinal: Positive for constipation. Negative for heartburn, nausea, vomiting and diarrhea.  Genitourinary: Negative for dysuria, urgency, frequency and hematuria.  Skin: Negative for itching and rash.  Neurological: Negative for dizziness, loss of consciousness and headaches.   Physical Exam   Blood pressure 124/72, pulse 92, temperature 98.4 F (36.9 C), temperature source Oral, resp. rate 18, last menstrual period 12/31/2013.  Physical  Exam  Constitutional: She is oriented to person, place, and time. She appears well-developed and well-nourished.  HENT:  Head: Normocephalic and atraumatic.  Eyes: Conjunctivae and EOM are normal.  Neck: Normal range of motion.  Cardiovascular: Normal rate.   Respiratory: Effort normal. No respiratory distress.  GI: Soft. She exhibits no distension. There is no tenderness.  Genitourinary: Vagina normal and uterus normal. No vaginal discharge found.  Musculoskeletal: Normal range of motion. She  exhibits no edema.  Neurological: She is alert and oriented to person, place, and time.  Skin: Skin is warm and dry. No erythema.  Dilation: Closed Effacement (%): Thick Cervical Position: Middle   MAU Course  Procedures  MDM NST reactive  Assessment and Plan  Patient is 34 y.o. [phone] [redacted]w[redacted]d reporting pelvic pressure and contractions likely secondary to braxton hicks - fetal kick counts reinforced - preterm labor precautions - may continue to take  Procardia prn, PO hydration encouraged.  Cervical exam VERY reassuring.   Clariza Sickman ROCIO 10/10/2014, 12:05 AM

## 2014-10-10 DIAGNOSIS — O479 False labor, unspecified: Secondary | ICD-10-CM | POA: Diagnosis not present

## 2014-10-10 DIAGNOSIS — Z3A33 33 weeks gestation of pregnancy: Secondary | ICD-10-CM | POA: Diagnosis not present

## 2014-10-10 NOTE — Discharge Instructions (Signed)
Braxton Hicks Contractions °Contractions of the uterus can occur throughout pregnancy. Contractions are not always a sign that you are in labor.  °WHAT ARE BRAXTON HICKS CONTRACTIONS?  °Contractions that occur before labor are called Braxton Hicks contractions, or false labor. Toward the end of pregnancy (32-34 weeks), these contractions can develop more often and may become more forceful. This is not true labor because these contractions do not result in opening (dilatation) and thinning of the cervix. They are sometimes difficult to tell apart from true labor because these contractions can be forceful and people have different pain tolerances. You should not feel embarrassed if you go to the hospital with false labor. Sometimes, the only way to tell if you are in true labor is for your health care provider to look for changes in the cervix. °If there are no prenatal problems or other health problems associated with the pregnancy, it is completely safe to be sent home with false labor and await the onset of true labor. °HOW CAN YOU TELL THE DIFFERENCE BETWEEN TRUE AND FALSE LABOR? °False Labor °· The contractions of false labor are usually shorter and not as hard as those of true labor.   °· The contractions are usually irregular.   °· The contractions are often felt in the front of the lower abdomen and in the groin.   °· The contractions may go away when you walk around or change positions while lying down.   °· The contractions get weaker and are shorter lasting as time goes on.   °· The contractions do not usually become progressively stronger, regular, and closer together as with true labor.   °True Labor °· Contractions in true labor last 30-70 seconds, become very regular, usually become more intense, and increase in frequency.   °· The contractions do not go away with walking.   °· The discomfort is usually felt in the top of the uterus and spreads to the lower abdomen and low back.   °· True labor can be  determined by your health care provider with an exam. This will show that the cervix is dilating and getting thinner.   °WHAT TO REMEMBER °· Keep up with your usual exercises and follow other instructions given by your health care provider.   °· Take medicines as directed by your health care provider.   °· Keep your regular prenatal appointments.   °· Eat and drink lightly if you think you are going into labor.   °· If Braxton Hicks contractions are making you uncomfortable:   °¨ Change your position from lying down or resting to walking, or from walking to resting.   °¨ Sit and rest in a tub of warm water.   °¨ Drink 2-3 glasses of water. Dehydration may cause these contractions.   °¨ Do slow and deep breathing several times an hour.   °WHEN SHOULD I SEEK IMMEDIATE MEDICAL CARE? °Seek immediate medical care if: °· Your contractions become stronger, more regular, and closer together.   °· You have fluid leaking or gushing from your vagina.   °· You have a fever.   °· You pass blood-tinged mucus.   °· You have vaginal bleeding.   °· You have continuous abdominal pain.   °· You have low back pain that you never had before.   °· You feel your baby's head pushing down and causing pelvic pressure.   °· Your baby is not moving as much as it used to.   °Document Released: 03/19/2005 Document Revised: 03/24/2013 Document Reviewed: 12/29/2012 °Exit[employer]® Patient Information ©2015 [employer]. This information is not intended to replace advice given to you by your health care   provider. Make sure you discuss any questions you have with your health care provider. ° °

## 2014-10-12 ENCOUNTER — Ambulatory Visit (INDEPENDENT_AMBULATORY_CARE_PROVIDER_SITE_OTHER): Payer: 59 | Admitting: Family Medicine

## 2014-10-12 VITALS — BP 121/71 | HR 90 | Wt 147.0 lb

## 2014-10-12 DIAGNOSIS — O09293 Supervision of pregnancy with other poor reproductive or obstetric history, third trimester: Secondary | ICD-10-CM

## 2014-10-12 LAB — POCT URINALYSIS DIP (DEVICE)
Bilirubin Urine: NEGATIVE
Glucose, UA: NEGATIVE mg/dL
Hgb urine dipstick: NEGATIVE
Ketones, ur: >=160 mg/dL — AB
Leukocytes, UA: NEGATIVE
Nitrite: NEGATIVE
Protein, ur: NEGATIVE mg/dL
SPECIFIC GRAVITY, URINE: 1.02 (ref 1.005–1.030)
Urobilinogen, UA: 0.2 mg/dL (ref 0.0–1.0)
pH: 6 (ref 5.0–8.0)

## 2014-10-12 NOTE — Progress Notes (Signed)
Pt reports numbness and tingling of fingers on her Rt hand.  Pt advised that a wrist splint may help - especially @ night while sleeping. [place] for growth & BPP on 7/15.  After waiting for over 30 minutes to see the doctor for prenatal visit, pt stated that she is hungry and could not wait any longer. Pt left clinic.

## 2014-10-13 NOTE — Progress Notes (Signed)
Pt left prior to being seen ?

## 2014-10-15 ENCOUNTER — Ambulatory Visit (HOSPITAL_COMMUNITY)
Admission: RE | Admit: 2014-10-15 | Discharge: 2014-10-15 | Disposition: A | Discharge status: Home / Self Care | Payer: 59 | Source: Ambulatory Visit | Attending: Maternal and Fetal Medicine | Admitting: Maternal and Fetal Medicine

## 2014-10-15 DIAGNOSIS — O368130 Decreased fetal movements, third trimester, not applicable or unspecified: Secondary | ICD-10-CM | POA: Insufficient documentation

## 2014-10-15 DIAGNOSIS — O09291 Supervision of pregnancy with other poor reproductive or obstetric history, first trimester: Secondary | ICD-10-CM | POA: Insufficient documentation

## 2014-10-15 DIAGNOSIS — O3413 Maternal care for benign tumor of corpus uteri, third trimester: Secondary | ICD-10-CM | POA: Diagnosis not present

## 2014-10-15 DIAGNOSIS — D259 Leiomyoma of uterus, unspecified: Secondary | ICD-10-CM | POA: Diagnosis not present

## 2014-10-15 DIAGNOSIS — O09299 Supervision of pregnancy with other poor reproductive or obstetric history, unspecified trimester: Secondary | ICD-10-CM

## 2014-10-15 DIAGNOSIS — Z3A34 34 weeks gestation of pregnancy: Secondary | ICD-10-CM | POA: Diagnosis not present

## 2014-10-15 DIAGNOSIS — O99323 Drug use complicating pregnancy, third trimester: Secondary | ICD-10-CM | POA: Insufficient documentation

## 2014-10-15 DIAGNOSIS — O99320 Drug use complicating pregnancy, unspecified trimester: Secondary | ICD-10-CM | POA: Insufficient documentation

## 2014-10-19 ENCOUNTER — Other Ambulatory Visit: Payer: 59

## 2014-10-20 ENCOUNTER — Other Ambulatory Visit: Payer: Self-pay | Admitting: Obstetrics & Gynecology

## 2014-10-20 DIAGNOSIS — Z3A39 39 weeks gestation of pregnancy: Secondary | ICD-10-CM

## 2014-10-20 DIAGNOSIS — D259 Leiomyoma of uterus, unspecified: Secondary | ICD-10-CM

## 2014-10-20 DIAGNOSIS — Z3A36 36 weeks gestation of pregnancy: Secondary | ICD-10-CM

## 2014-10-20 DIAGNOSIS — O99323 Drug use complicating pregnancy, third trimester: Secondary | ICD-10-CM

## 2014-10-20 DIAGNOSIS — O09293 Supervision of pregnancy with other poor reproductive or obstetric history, third trimester: Secondary | ICD-10-CM

## 2014-10-20 DIAGNOSIS — Z3A37 37 weeks gestation of pregnancy: Secondary | ICD-10-CM

## 2014-10-20 DIAGNOSIS — Z3A35 35 weeks gestation of pregnancy: Secondary | ICD-10-CM

## 2014-10-20 DIAGNOSIS — O3413 Maternal care for benign tumor of corpus uteri, third trimester: Secondary | ICD-10-CM

## 2014-10-20 DIAGNOSIS — Z3A38 38 weeks gestation of pregnancy: Secondary | ICD-10-CM

## 2014-10-22 ENCOUNTER — Other Ambulatory Visit: Payer: 59

## 2014-10-22 ENCOUNTER — Encounter (HOSPITAL_COMMUNITY): Payer: Self-pay

## 2014-10-22 ENCOUNTER — Ambulatory Visit (HOSPITAL_COMMUNITY)
Admission: RE | Admit: 2014-10-22 | Discharge: 2014-10-22 | Disposition: A | Discharge status: Home / Self Care | Payer: 59 | Source: Ambulatory Visit | Attending: Obstetrics & Gynecology | Admitting: Obstetrics & Gynecology

## 2014-10-22 DIAGNOSIS — O09293 Supervision of pregnancy with other poor reproductive or obstetric history, third trimester: Secondary | ICD-10-CM | POA: Diagnosis not present

## 2014-10-22 DIAGNOSIS — Z3A35 35 weeks gestation of pregnancy: Secondary | ICD-10-CM | POA: Insufficient documentation

## 2014-10-22 DIAGNOSIS — O3413 Maternal care for benign tumor of corpus uteri, third trimester: Secondary | ICD-10-CM | POA: Insufficient documentation

## 2014-10-22 DIAGNOSIS — D259 Leiomyoma of uterus, unspecified: Secondary | ICD-10-CM | POA: Diagnosis not present

## 2014-10-22 DIAGNOSIS — O99323 Drug use complicating pregnancy, third trimester: Secondary | ICD-10-CM | POA: Diagnosis not present

## 2014-10-26 ENCOUNTER — Ambulatory Visit (INDEPENDENT_AMBULATORY_CARE_PROVIDER_SITE_OTHER): Payer: 59 | Admitting: Certified Nurse Midwife

## 2014-10-26 ENCOUNTER — Other Ambulatory Visit: Payer: Self-pay | Admitting: Certified Nurse Midwife

## 2014-10-26 VITALS — BP 122/64 | HR 91 | Wt 149.1 lb

## 2014-10-26 DIAGNOSIS — O403XX0 Polyhydramnios, third trimester, not applicable or unspecified: Secondary | ICD-10-CM | POA: Diagnosis not present

## 2014-10-26 DIAGNOSIS — O09293 Supervision of pregnancy with other poor reproductive or obstetric history, third trimester: Secondary | ICD-10-CM

## 2014-10-26 DIAGNOSIS — O0993 Supervision of high risk pregnancy, unspecified, third trimester: Secondary | ICD-10-CM | POA: Diagnosis not present

## 2014-10-26 LAB — POCT URINALYSIS DIP (DEVICE)
BILIRUBIN URINE: NEGATIVE
Glucose, UA: NEGATIVE mg/dL
HGB URINE DIPSTICK: NEGATIVE
Ketones, ur: NEGATIVE mg/dL
Leukocytes, UA: NEGATIVE
NITRITE: NEGATIVE
Protein, ur: NEGATIVE mg/dL
Specific Gravity, Urine: >=1.03 (ref 1.005–1.030)
UROBILINOGEN UA: 0.2 mg/dL (ref 0.0–1.0)
pH: 7.5 (ref 5.0–8.0)

## 2014-10-26 LAB — OB RESULTS CONSOLE GC/CHLAMYDIA
CHLAMYDIA, DNA PROBE: NEGATIVE
GC PROBE AMP, GENITAL: NEGATIVE

## 2014-10-26 LAB — OB RESULTS CONSOLE GBS: STREP GROUP B AG: POSITIVE

## 2014-10-26 NOTE — Patient Instructions (Signed)

## 2014-10-26 NOTE — Progress Notes (Signed)
Subjective:  [name] [name] is a 34 y.o. G4P0300 at [redacted]w[redacted]d being seen today for ongoing prenatal care.  Patient reports contractions since yesterday.  Contractions: Irregular.  Vag. Bleeding: None. Movement: Present. NST reactive. Denies leaking of fluid.   The following portions of the patient's history were reviewed and updated as appropriate: allergies, current medications, past family history, past medical history, past social history, past surgical history and problem list.   Objective:   Filed Vitals:   10/26/14 0910  BP: 122/64  Pulse: 91  Weight: 149 lb 1.6 oz (67.631 kg)    Fetal Status: Fetal Heart Rate (bpm): NST   Movement: Present     General:  Alert, oriented and cooperative. Patient is in no acute distress.  Skin: Skin is warm and dry. No rash noted.   Cardiovascular: Normal heart rate noted  Respiratory: Normal respiratory effort, no problems with respiration noted  Abdomen: Soft, gravid, appropriate for gestational age. Pain/Pressure: Present     Vaginal: Vag. Bleeding: None.       Cervix: Exam revealed        Extremities: Normal range of motion.     Mental Status: Normal mood and affect. Normal behavior. Normal judgment and thought content.   Urinalysis: Urine Protein: Negative Urine Glucose: Negative  Assessment and Plan:  Pregnancy: G4P0300 at [redacted]w[redacted]d  1. History of intrauterine fetal death, currently pregnant, third trimester  - Culture, beta strep (group b only) - GC/Chlamydia Probe Amp - HIV antibody (with reflex)  Preterm labor symptoms and general obstetric precautions including but not limited to vaginal bleeding, contractions, leaking of fluid and fetal movement were reviewed in detail with the patient. Please refer to After Visit Summary for other counseling recommendations.  Return in about 1 week (around 11/02/2014) for 2x/wk as scheduled.   Larey Days, CNM

## 2014-10-27 LAB — GC/CHLAMYDIA PROBE AMP
CT Probe RNA: NEGATIVE
GC Probe RNA: NEGATIVE

## 2014-10-28 LAB — CULTURE, BETA STREP (GROUP B ONLY)

## 2014-10-29 ENCOUNTER — Ambulatory Visit (HOSPITAL_COMMUNITY): Payer: 59

## 2014-10-29 ENCOUNTER — Ambulatory Visit (INDEPENDENT_AMBULATORY_CARE_PROVIDER_SITE_OTHER): Payer: 59 | Admitting: *Deleted

## 2014-10-29 DIAGNOSIS — O09293 Supervision of pregnancy with other poor reproductive or obstetric history, third trimester: Secondary | ICD-10-CM | POA: Diagnosis not present

## 2014-10-29 DIAGNOSIS — O403XX1 Polyhydramnios, third trimester, fetus 1: Secondary | ICD-10-CM | POA: Diagnosis not present

## 2014-10-29 LAB — HIV ANTIBODY (ROUTINE TESTING W REFLEX): HIV 1&2 Ab, 4th Generation: NONREACTIVE

## 2014-10-29 NOTE — Progress Notes (Signed)
NST performed today was reviewed and was found to be reactive.  AFI normal at 23 cm.  Continue recommended antenatal testing and prenatal care.

## 2014-11-02 ENCOUNTER — Ambulatory Visit (INDEPENDENT_AMBULATORY_CARE_PROVIDER_SITE_OTHER): Payer: 59 | Admitting: Obstetrics and Gynecology

## 2014-11-02 VITALS — BP 129/76 | HR 90 | Wt 151.0 lb

## 2014-11-02 DIAGNOSIS — B951 Streptococcus, group B, as the cause of diseases classified elsewhere: Secondary | ICD-10-CM

## 2014-11-02 DIAGNOSIS — O09293 Supervision of pregnancy with other poor reproductive or obstetric history, third trimester: Secondary | ICD-10-CM | POA: Diagnosis not present

## 2014-11-02 DIAGNOSIS — O403XX1 Polyhydramnios, third trimester, fetus 1: Secondary | ICD-10-CM

## 2014-11-02 DIAGNOSIS — A491 Streptococcal infection, unspecified site: Secondary | ICD-10-CM

## 2014-11-02 DIAGNOSIS — O0993 Supervision of high risk pregnancy, unspecified, third trimester: Secondary | ICD-10-CM

## 2014-11-02 DIAGNOSIS — O09299 Supervision of pregnancy with other poor reproductive or obstetric history, unspecified trimester: Secondary | ICD-10-CM

## 2014-11-02 LAB — POCT URINALYSIS DIP (DEVICE)
BILIRUBIN URINE: NEGATIVE
Glucose, UA: NEGATIVE mg/dL
Hgb urine dipstick: NEGATIVE
Ketones, ur: >=160 mg/dL — AB
Leukocytes, UA: NEGATIVE
Nitrite: NEGATIVE
PH: 6.5 (ref 5.0–8.0)
PROTEIN: 30 mg/dL — AB
Specific Gravity, Urine: 1.025 (ref 1.005–1.030)
Urobilinogen, UA: 0.2 mg/dL (ref 0.0–1.0)

## 2014-11-02 NOTE — Progress Notes (Signed)
Subjective:  [name] [name] is a 34 y.o. G4P0300 at [redacted]w[redacted]d being seen today for ongoing prenatal care.  Patient reports no complaints.  Contractions: Irregular.  Vag. Bleeding: None. Movement: Present. Denies leaking of fluid.   The following portions of the patient's history were reviewed and updated as appropriate: allergies, current medications, past family history, past medical history, past social history, past surgical history and problem list.   Objective:   Filed Vitals:   11/02/14 0918  BP: 129/76  Pulse: 90  Weight: 151 lb (68.493 kg)    Fetal Status: Fetal Heart Rate (bpm): NST   Movement: Present     General:  Alert, oriented and cooperative. Patient is in no acute distress.  Skin: Skin is warm and dry. No rash noted.   Cardiovascular: Normal heart rate noted  Respiratory: Normal respiratory effort, no problems with respiration noted  Abdomen: Soft, gravid, appropriate for gestational age. Pain/Pressure: Present     Vaginal: Vag. Bleeding: None.       Cervix: Not evaluated        Extremities: Normal range of motion.     Mental Status: Normal mood and affect. Normal behavior. Normal judgment and thought content.   Urinalysis: Urine Protein: 1+ Urine Glucose: Negative 3 Assessment and Plan:  Pregnancy: G4P0300 at [redacted]w[redacted]d  1. History of intrauterine fetal death, currently pregnant, third trimester Reactive NST today, continue twice-weekly nst and once-weekly AFI and deliver at 31. Induction process discussed today.  2. Polyhydramnios, third trimester, fetus 1 - continue weekly afi  3. Supervision of high risk pregnancy, antepartum, third trimester See above  4. GBS positive - no history pcn allergy, will require during labor   Term labor symptoms and general obstetric precautions including but not limited to vaginal bleeding, contractions, leaking of fluid and fetal movement were reviewed in detail with the patient. Please refer to After Visit Summary for other  counseling recommendations.  Return in about 3 days (around 11/05/2014) for 2x/wk as scheduled.   Gwynne Edinger, MD

## 2014-11-02 NOTE — Progress Notes (Signed)
Breastfeeding tip of the week reviewed.  IOL scheduled 8/15 @ 0630.

## 2014-11-05 ENCOUNTER — Ambulatory Visit (HOSPITAL_COMMUNITY): Payer: 59

## 2014-11-05 ENCOUNTER — Ambulatory Visit (INDEPENDENT_AMBULATORY_CARE_PROVIDER_SITE_OTHER): Payer: 59 | Admitting: *Deleted

## 2014-11-05 VITALS — BP 109/66 | HR 86

## 2014-11-05 DIAGNOSIS — O09293 Supervision of pregnancy with other poor reproductive or obstetric history, third trimester: Secondary | ICD-10-CM | POA: Diagnosis not present

## 2014-11-05 DIAGNOSIS — O403XX1 Polyhydramnios, third trimester, fetus 1: Secondary | ICD-10-CM | POA: Diagnosis not present

## 2014-11-05 NOTE — Progress Notes (Signed)
NST performed today was reviewed and was found to be reactive.  AFI normal at 23.9 cm.  Continue recommended antenatal testing and prenatal care.

## 2014-11-08 ENCOUNTER — Telehealth (HOSPITAL_COMMUNITY): Payer: Self-pay | Admitting: *Deleted

## 2014-11-08 NOTE — Telephone Encounter (Signed)
Preadmission screen  

## 2014-11-09 ENCOUNTER — Telehealth: Payer: Self-pay | Admitting: *Deleted

## 2014-11-09 ENCOUNTER — Other Ambulatory Visit: Payer: 59

## 2014-11-09 NOTE — Telephone Encounter (Signed)
Called pt regarding missed appt earlier today. She stated that she ahd overslept and was going to be more than 15 minutes late. She endorses good FM daily and has no complaints. Pt has scheduled appt on 8/12 for NST/AFI and will now add prenatal visit. Labor sx reviewed and pt instructed to go to [facility] for labor or decreased FM. She voiced understanding of all information and instructions.

## 2014-11-10 ENCOUNTER — Encounter (HOSPITAL_COMMUNITY): Payer: Self-pay | Admitting: *Deleted

## 2014-11-10 ENCOUNTER — Telehealth (HOSPITAL_COMMUNITY): Payer: Self-pay | Admitting: *Deleted

## 2014-11-10 NOTE — Telephone Encounter (Signed)
Preadmission screen  

## 2014-11-12 ENCOUNTER — Ambulatory Visit (HOSPITAL_COMMUNITY): Payer: 59

## 2014-11-12 ENCOUNTER — Ambulatory Visit (INDEPENDENT_AMBULATORY_CARE_PROVIDER_SITE_OTHER): Payer: 59 | Admitting: *Deleted

## 2014-11-12 VITALS — BP 124/67 | HR 88

## 2014-11-12 DIAGNOSIS — O09293 Supervision of pregnancy with other poor reproductive or obstetric history, third trimester: Secondary | ICD-10-CM

## 2014-11-12 DIAGNOSIS — O403XX1 Polyhydramnios, third trimester, fetus 1: Secondary | ICD-10-CM

## 2014-11-12 MED ORDER — TERCONAZOLE 0.4 % VA CREA: 1.0000 | TOPICAL_CREAM | Freq: Every day | VAGINAL | Status: DC

## 2014-11-12 NOTE — Progress Notes (Signed)
NST reviewed and reactive.  

## 2014-11-12 NOTE — Progress Notes (Signed)
Pt reports white vaginal d/c and itching which started yesterday. Rx for Terazol sent to pharmacy per protocol.  IOL scheduled 8/15 @ 0630. [name] [name]

## 2014-11-15 ENCOUNTER — Inpatient Hospital Stay (HOSPITAL_COMMUNITY)
Admission: RE | Admit: 2014-11-15 | Discharge: 2014-11-19 | DRG: 765 | Disposition: A | Discharge status: Home / Self Care | Payer: 59 | Source: Ambulatory Visit | Attending: Family Medicine | Admitting: Family Medicine

## 2014-11-15 ENCOUNTER — Encounter (HOSPITAL_COMMUNITY): Payer: Self-pay

## 2014-11-15 VITALS — BP 129/56 | HR 75 | Temp 98.1°F | Resp 20 | Ht 61.0 in | Wt 151.0 lb

## 2014-11-15 DIAGNOSIS — O99334 Smoking (tobacco) complicating childbirth: Secondary | ICD-10-CM | POA: Diagnosis present

## 2014-11-15 DIAGNOSIS — O99824 Streptococcus B carrier state complicating childbirth: Secondary | ICD-10-CM | POA: Diagnosis present

## 2014-11-15 DIAGNOSIS — D259 Leiomyoma of uterus, unspecified: Secondary | ICD-10-CM | POA: Diagnosis present

## 2014-11-15 DIAGNOSIS — F1290 Cannabis use, unspecified, uncomplicated: Secondary | ICD-10-CM | POA: Diagnosis present

## 2014-11-15 DIAGNOSIS — O0993 Supervision of high risk pregnancy, unspecified, third trimester: Secondary | ICD-10-CM

## 2014-11-15 DIAGNOSIS — Z6828 Body mass index (BMI) 28.0-28.9, adult: Secondary | ICD-10-CM

## 2014-11-15 DIAGNOSIS — Z833 Family history of diabetes mellitus: Secondary | ICD-10-CM | POA: Diagnosis not present

## 2014-11-15 DIAGNOSIS — O99324 Drug use complicating childbirth: Secondary | ICD-10-CM | POA: Diagnosis present

## 2014-11-15 DIAGNOSIS — O3413 Maternal care for benign tumor of corpus uteri, third trimester: Secondary | ICD-10-CM | POA: Diagnosis present

## 2014-11-15 DIAGNOSIS — Z3A39 39 weeks gestation of pregnancy: Secondary | ICD-10-CM | POA: Diagnosis present

## 2014-11-15 DIAGNOSIS — O3412 Maternal care for benign tumor of corpus uteri, second trimester: Secondary | ICD-10-CM

## 2014-11-15 DIAGNOSIS — O09293 Supervision of pregnancy with other poor reproductive or obstetric history, third trimester: Secondary | ICD-10-CM | POA: Diagnosis not present

## 2014-11-15 DIAGNOSIS — Z8249 Family history of ischemic heart disease and other diseases of the circulatory system: Secondary | ICD-10-CM

## 2014-11-15 DIAGNOSIS — F17210 Nicotine dependence, cigarettes, uncomplicated: Secondary | ICD-10-CM | POA: Diagnosis present

## 2014-11-15 DIAGNOSIS — O9989 Other specified diseases and conditions complicating pregnancy, childbirth and the puerperium: Secondary | ICD-10-CM | POA: Diagnosis present

## 2014-11-15 DIAGNOSIS — Z98891 History of uterine scar from previous surgery: Secondary | ICD-10-CM

## 2014-11-15 LAB — TYPE AND SCREEN
ABO/RH(D): O POS
Antibody Screen: NEGATIVE

## 2014-11-15 LAB — CBC
HEMATOCRIT: 32.3 % — AB (ref 36.0–46.0)
Hemoglobin: 10.7 g/dL — ABNORMAL LOW (ref 12.0–15.0)
MCH: 27.7 pg (ref 26.0–34.0)
MCHC: 33.1 g/dL (ref 30.0–36.0)
MCV: 83.7 fL (ref 78.0–100.0)
Platelets: 260 10*3/uL (ref 150–400)
RBC: 3.86 MIL/uL — ABNORMAL LOW (ref 3.87–5.11)
RDW: 15.2 % (ref 11.5–15.5)
WBC: 6.5 10*3/uL (ref 4.0–10.5)

## 2014-11-15 LAB — RPR: RPR Ser Ql: NONREACTIVE

## 2014-11-15 MED ORDER — LACTATED RINGERS IV SOLN
INTRAVENOUS | Status: DC
  Administered 2014-11-15 – 2014-11-17 (×5): via INTRAVENOUS

## 2014-11-15 MED ORDER — FENTANYL CITRATE (PF) 100 MCG/2ML IJ SOLN
50.0000 ug | INTRAMUSCULAR | Status: DC | PRN
  Administered 2014-11-15 – 2014-11-16 (×3): 100 ug via INTRAVENOUS
  Filled 2014-11-15 (×3): qty 2

## 2014-11-15 MED ORDER — ONDANSETRON HCL 4 MG/2ML IJ SOLN: 4.0000 mg | Freq: Four times a day (QID) | INTRAMUSCULAR | Status: DC | PRN

## 2014-11-15 MED ORDER — TERBUTALINE SULFATE 1 MG/ML IJ SOLN: 0.2500 mg | Freq: Once | INTRAMUSCULAR | Status: DC | PRN

## 2014-11-15 MED ORDER — OXYTOCIN BOLUS FROM INFUSION: 500.0000 mL | INTRAVENOUS | Status: DC

## 2014-11-15 MED ORDER — CITRIC ACID-SODIUM CITRATE 334-500 MG/5ML PO SOLN
30.0000 mL | ORAL | Status: DC | PRN
  Administered 2014-11-17: 30 mL via ORAL
  Filled 2014-11-15: qty 15

## 2014-11-15 MED ORDER — ACETAMINOPHEN 325 MG PO TABS: 650.0000 mg | ORAL_TABLET | ORAL | Status: DC | PRN

## 2014-11-15 MED ORDER — LIDOCAINE HCL (PF) 1 % IJ SOLN: 30.0000 mL | INTRAMUSCULAR | Status: DC | PRN

## 2014-11-15 MED ORDER — OXYTOCIN 40 UNITS IN LACTATED RINGERS INFUSION - SIMPLE MED
62.5000 mL/h | INTRAVENOUS | Status: DC
  Filled 2014-11-15: qty 1000

## 2014-11-15 MED ORDER — NALBUPHINE HCL 10 MG/ML IJ SOLN
10.0000 mg | Freq: Four times a day (QID) | INTRAMUSCULAR | Status: DC | PRN
  Administered 2014-11-15: 10 mg via INTRAVENOUS
  Filled 2014-11-15: qty 1

## 2014-11-15 MED ORDER — ONDANSETRON HCL 40 MG/20ML IJ SOLN
8.0000 mg | Freq: Once | INTRAMUSCULAR | Status: AC
  Administered 2014-11-15: 8 mg via INTRAVENOUS
  Filled 2014-11-15: qty 4

## 2014-11-15 MED ORDER — PENICILLIN G POTASSIUM 5000000 UNITS IJ SOLR
2.5000 10*6.[IU] | INTRAVENOUS | Status: DC
  Filled 2014-11-15 (×2): qty 2.5

## 2014-11-15 MED ORDER — PENICILLIN G POTASSIUM 5000000 UNITS IJ SOLR
5.0000 10*6.[IU] | Freq: Once | INTRAVENOUS | Status: DC
  Filled 2014-11-15: qty 5

## 2014-11-15 MED ORDER — LACTATED RINGERS IV SOLN
500.0000 mL | INTRAVENOUS | Status: DC | PRN
  Administered 2014-11-16: 1000 mL via INTRAVENOUS
  Administered 2014-11-16 (×2): 500 mL via INTRAVENOUS
  Administered 2014-11-17: 1000 mL via INTRAVENOUS
  Administered 2014-11-17: 500 mL via INTRAVENOUS

## 2014-11-15 MED ORDER — ZOLPIDEM TARTRATE 5 MG PO TABS: 5.0000 mg | ORAL_TABLET | Freq: Every evening | ORAL | Status: DC | PRN

## 2014-11-15 MED ORDER — MISOPROSTOL 25 MCG QUARTER TABLET
25.0000 ug | ORAL_TABLET | ORAL | Status: DC
  Administered 2014-11-15: 25 ug via ORAL
  Filled 2014-11-15 (×2): qty 0.25

## 2014-11-15 MED ORDER — MISOPROSTOL 25 MCG QUARTER TABLET
25.0000 ug | ORAL_TABLET | ORAL | Status: DC
  Administered 2014-11-15 (×2): 25 ug via VAGINAL
  Filled 2014-11-15: qty 0.25

## 2014-11-15 MED ORDER — NALBUPHINE HCL 10 MG/ML IJ SOLN
10.0000 mg | INTRAMUSCULAR | Status: DC | PRN
  Administered 2014-11-15: 10 mg via INTRAMUSCULAR
  Filled 2014-11-15: qty 1

## 2014-11-15 MED ORDER — PROMETHAZINE HCL 25 MG/ML IJ SOLN
12.5000 mg | Freq: Four times a day (QID) | INTRAMUSCULAR | Status: DC | PRN
  Administered 2014-11-15: 12.5 mg via INTRAVENOUS
  Filled 2014-11-15: qty 1

## 2014-11-15 NOTE — Progress Notes (Signed)
MARISELA LINE is a 34 y.o. G4P0300 at [redacted]w[redacted]d   Subjective: Feeling ctx as mod painful  Objective: BP 93/50 mmHg  Pulse 86  Temp(Src) 98.4 F (36.9 C) (Oral)  Resp 16  Ht 5\' 1"  (1.549 m)  Wt 68.493 kg (151 lb)  BMI 28.55 kg/m2  LMP 12/31/2013 (Approximate)      FHT:  FHR: 120s bpm, variability: moderate,  accelerations:  Present,  decelerations:  Absent UC:   irreg 2-6 mins SVE:   Dilation: Closed Effacement (%): 50 Station: -2 Exam by:: K.Joselyne Spake, CNM  Labs: Lab Results  Component Value Date   WBC 6.5 11/15/2014   HGB 10.7* 11/15/2014   HCT 32.3* 11/15/2014   MCV 83.7 11/15/2014   PLT 260 11/15/2014    Assessment / Plan: IOL process Cx unfavorable  Second dose of vag cytotec placed Will give Nubain/Phen for pain relief  Rahmir Beever 11/15/2014, 2:18 PM

## 2014-11-15 NOTE — H&P (Signed)
Obstetric History and Physical  [name] [name] is a 34 y.o. [phone] with IUP at [redacted]w[redacted]d by [place] at [redacted]w[redacted]d  presenting for IOL for recurrent preterm births x 3 (20, 20, 28 wks). The 28wk delivery was an abruption w/ IUFD. This pregnancy has been complicated by THC use in 3rd trimester, chronic PID, prior polyhydramnios with current normal AFI, anterior LUS leiomyoma 3.5 x 5.3 x 2.3 and mid right leiomyoma 2.6 x 3 x 1.3 that are not obstructing the uterine cavity. Normal maternal karyotype and neg antiphospholipid workup. Patient states she has been having  irregular, occasional contractions, none vaginal bleeding, intact membranes, with active fetal movement.  Patient denies any HA, changes in vision, or RUQ pain.     Prenatal Course Source of Care: [facility]  with onset of care at 11+3 weeks Pregnancy complications or risks: Patient Active Problem List   Diagnosis Date Noted  . GBS (group B streptococcus) infection 11/02/2014  . [redacted] weeks gestation of pregnancy   . Prior pregnancy with fetal demise and current pregnancy in third trimester   . [redacted] weeks gestation of pregnancy   . Drug use complicating pregnancy   . [redacted] weeks gestation of pregnancy   . Pregnancy with poor obstetric history   . Uterine fibroids affecting pregnancy in third trimester, antepartum   . Polyhydramnios   . Drug use complicating pregnancy in third trimester   . History of intrauterine fetal death, currently pregnant   . Prior poor obstetrical history, antepartum   . Prior pregnancy with fetal demise and current pregnancy   . Supervision of high risk pregnancy, antepartum 06/03/2014  . Uterine fibroids affecting pregnancy in second trimester, antepartum   . H/O anaphylaxis 11/25/2013  . Female infertility 10/10/2012  . 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 Chronic Pelvic Inflammatory Disease 06/26/2012  . Chronic Pelvic pain and Dysmenorrhea 11/01/2010   She plans to  breastfeed She desires Depo-Provera for postpartum contraception.   Prenatal labs and studies: ABO, Rh: --/--/O POS (05/06 1908) Antibody: NEG (05/06 1908) Rubella: 1.22 (02/04 1725) RPR: NON REAC (05/31 1412)  HBsAg: NEGATIVE (02/04 1725)  HIV: NONREACTIVE (07/29 1033)  VPX:TGGYIRSW (07/26 0000) 1 hr Gluco: Early - 97; Third trimester - 127 Genetic screening normal Anatomy US normal   Clinic [facility] Prenatal Labs  Dating First trimester scan Blood type: O+  Genetic Screen 1 Screen: NT nml AFP: neg  Antibody: Neg  Anatomic [place] Normal Rubella: Immune  GTT Early: 11 Third trimester: 127 RPR: NR  Flu vaccine Declines HBsAg: Neg  TDaP vaccine 08/31/14  HIV: NONREACTIVE (11/10 0821)   GBS  Positive, no allergy (For PCN allergy, check sensitivities) GBS: positive  Contraception Depo Provera Pap: Normal pap smear and negative high-risk HPV. 09/07/13  Baby Food Breast   Circumcision Yes   Pediatrician [place]    Support Person Demond (husband)         Prenatal Transfer Tool  Maternal Diabetes: No Genetic Screening: Normal Maternal Ultrasounds/Referrals: Abnormal:  Findings:   Other: Previous polyhydramnios  Fetal Ultrasounds or other Referrals:  Referred to Materal Fetal Medicine  Maternal Substance Abuse:  Yes:  Type: Marijuana Significant Maternal Medications:  None Significant Maternal Lab Results: Lab values include: Group B Strep positive  Past Medical History  Diagnosis Date  . Ovarian cyst   . Abnormal Pap smear   . PID (pelvic inflammatory disease)   . Gonorrhea   . Trichomonas   . Fibroids   .  Uterine fibroid 02/15/2014  . IUFD (intrauterine fetal death)     Past Surgical History  Procedure Laterality Date  . Diagnostic laparoscopy  May 2012  . Laparoscopy N/A 06/26/2012    Procedure: LAPAROSCOPY OPERATIVE with CHROMOPERTUBATION;  Surgeon: Osborne Oman, MD;  Location: [facility];   Service: Gynecology;  Laterality: N/A;  . Salpingoophorectomy Left 06/26/2012    Procedure: SALPINGO OOPHORECTOMY;  Surgeon: Osborne Oman, MD;  Location: [facility];  Service: Gynecology;  Laterality: Left;    OB History  Gravida Para Term Preterm AB SAB TAB Ectopic Multiple Living  4 3 0 3 0 0 0 0 0 0     # Outcome Date GA Lbr Len/2nd Weight Sex Delivery Anes PTL Lv  4 Current           3 Preterm 04/02/00 [redacted]w[redacted]d    Vag-Spont  N FD  2 Preterm 04/02/98 [redacted]w[redacted]d    Vag-Spont  N FD     Complications: Abruptio Placenta  1 Preterm  [redacted]w[redacted]d    Vag-Spont  N FD      Social History   Social History  . Marital Status: Married    Spouse Name: N/A  . Number of Children: N/A  . Years of Education: N/A   Social History Main Topics  . Smoking status: Current Every Day Smoker -- 0.25 packs/day for 10 years    Types: Cigarettes    Last Attempt to Quit: 04/02/2014  . Smokeless tobacco: Never Used  . Alcohol Use: No  . Drug Use: Yes    Special: Marijuana     Comment: several weeks ago as of 11/10/2014  . Sexual Activity: Yes    Birth Control/ Protection: None     Comment: Last Depo injection 09/01/2012   Other Topics Concern  . None   Social History Narrative    Family History  Problem Relation Age of Onset  . Anesthesia problems Neg Hx   . Cystic fibrosis Mother   . Hypertension Mother   . Diabetes Mother     Prescriptions prior to admission  Medication Sig Dispense Refill Last Dose  . EPINEPHrine 0.3 mg/0.3 mL IJ SOAJ injection Inject 0.3 mg into the muscle once as needed (for allergic reaction).   Taking  . NIFEdipine (PROCARDIA-XL/ADALAT CC) 30 MG 24 hr tablet Take 1 tablet (30 mg total) by mouth 2 (two) times daily. (Patient not taking: Reported on 10/26/2014) 60 tablet 0 Not Taking  . Prenatal Vit-Fe Fumarate-FA (PRENATAL MULTIVITAMIN) TABS tablet Take 1 tablet by mouth daily.    Taking  . promethazine (PHENERGAN) 12.5 MG tablet Take 1 tablet (12.5 mg total) by mouth every 6 (six)  hours as needed for nausea or vomiting. 30 tablet 1 Taking  . terconazole (TERAZOL 7) 0.4 % vaginal cream Place 1 applicator vaginally at bedtime. 45 g 0     Allergies  Allergen Reactions  . Peanut-Containing Drug Products Anaphylaxis  . Spinach Anaphylaxis  . Nsaids Hives, Itching and Nausea And Vomiting  . Tramadol Itching and Nausea And Vomiting    Review of Systems: Negative except for what is mentioned in HPI.  Physical Exam: BP 115/65 mmHg  Pulse 94  Temp(Src) 98 F (36.7 C) (Oral)  Resp 18  Ht 5\' 1"  (1.549 m)  Wt 68.493 kg (151 lb)  BMI 28.55 kg/m2  LMP 12/31/2013 (Approximate) CONSTITUTIONAL: Well-developed, well-nourished female in no acute distress.  HENT:  Normocephalic, atraumatic. EYES: Conjunctivae and EOM are normal. Pupils are equal, round, and reactive to  light. No scleral icterus.  NECK: Normal range of motion, supple, no masses SKIN: Skin is warm and dry. No rash noted. Not diaphoretic. No erythema. No pallor. Marceline: Alert and oriented to person, place, and time.  PSYCHIATRIC: Normal mood and affect. Normal behavior. Normal judgment and thought content. CARDIOVASCULAR: Normal heart rate noted, regular rhythm RESPIRATORY: Effort and breath sounds normal, no problems with respiration noted ABDOMEN: Soft, nontender, nondistended, gravid. MUSCULOSKELETAL: Normal range of motion. No edema and no tenderness. 2+ distal pulses.  Dilation: Closed Presentation: Vertex Exam by:: K.Shaw, CNM   FHT:   Baseline rate 120 bpm   Variability moderate   Accelerations present    Decelerations none Contractions: Occassionally  Fetal State Assessment: Category I - tracings are normal.    Pertinent Labs/Studies:   No results found for this or any previous visit (from the past 24 hour(s)).  Assessment : [name] [name] is a 34 y.o. G4P0300 at [redacted]w[redacted]d being admitted for IOL.  Hx IUFD/abruption @ 28wks Cx unfavorable GBS pos  Plan: Labor: Expectant management.  Induction with Cytotec PO. Consider augmentation with FB, per protocol. FWB: Reassuring fetal heart tracing.  GBS positive, plan to start Pen w/ labor or ROM Delivery plan: Hopeful for vaginal delivery   Philippa Chester, MS3 11/15/2014,7:14 AM   CNM attestation:   [name] [name] is a 34 y.o. [phone] here for IOL due to hx 28wk abruption/IUFD  PE: BP 93/50 mmHg  Pulse 86  Temp(Src) 98.4 F (36.9 C) (Oral)  Resp 16  Ht 5\' 1"  (1.549 m)  Wt 68.493 kg (151 lb)  BMI 28.55 kg/m2  LMP 12/31/2013 (Approximate) Gen: calm comfortable, NAD Resp: normal effort, no distress Abd: gravid  ROS, labs, PMH reviewed  Plan: IUP@39 .0 Hx abruption/IUFD @ 28wks Prev poly- resolved GBS pos  Cytotec for cx ripening, then proceed w/ FB as we can  SHAW, KIMBERLY CNM 11/15/2014, 12:25 PM

## 2014-11-16 ENCOUNTER — Inpatient Hospital Stay (HOSPITAL_COMMUNITY): Payer: 59 | Admitting: Anesthesiology

## 2014-11-16 MED ORDER — PENICILLIN G POTASSIUM 5000000 UNITS IJ SOLR
2.5000 10*6.[IU] | INTRAMUSCULAR | Status: DC
  Administered 2014-11-16 – 2014-11-17 (×7): 2.5 10*6.[IU] via INTRAVENOUS
  Filled 2014-11-16 (×10): qty 2.5

## 2014-11-16 MED ORDER — MISOPROSTOL 200 MCG PO TABS
50.0000 ug | ORAL_TABLET | ORAL | Status: DC
  Administered 2014-11-16 (×2): 50 ug via ORAL
  Filled 2014-11-16 (×3): qty 0.5

## 2014-11-16 MED ORDER — OXYTOCIN 40 UNITS IN LACTATED RINGERS INFUSION - SIMPLE MED: 1.0000 m[IU]/min | INTRAVENOUS | Status: DC

## 2014-11-16 MED ORDER — LACTATED RINGERS IV SOLN: INTRAVENOUS | Status: DC

## 2014-11-16 MED ORDER — HYDROXYZINE HCL 50 MG/ML IM SOLN
50.0000 mg | Freq: Once | INTRAMUSCULAR | Status: AC
  Administered 2014-11-16: 50 mg via INTRAMUSCULAR
  Filled 2014-11-16: qty 1

## 2014-11-16 MED ORDER — PHENYLEPHRINE 40 MCG/ML (10ML) SYRINGE FOR IV PUSH (FOR BLOOD PRESSURE SUPPORT)
80.0000 ug | PREFILLED_SYRINGE | INTRAVENOUS | Status: DC | PRN
  Filled 2014-11-16: qty 20

## 2014-11-16 MED ORDER — PROMETHAZINE HCL 25 MG/ML IJ SOLN
25.0000 mg | Freq: Once | INTRAMUSCULAR | Status: AC
  Administered 2014-11-16: 25 mg via INTRAMUSCULAR
  Filled 2014-11-16: qty 1

## 2014-11-16 MED ORDER — TERBUTALINE SULFATE 1 MG/ML IJ SOLN: 0.2500 mg | Freq: Once | INTRAMUSCULAR | Status: DC | PRN

## 2014-11-16 MED ORDER — DIPHENHYDRAMINE HCL 50 MG/ML IJ SOLN
12.5000 mg | INTRAMUSCULAR | Status: DC | PRN
Start: 2014-11-16 — End: 2014-11-17

## 2014-11-16 MED ORDER — OXYTOCIN 40 UNITS IN LACTATED RINGERS INFUSION - SIMPLE MED
1.0000 m[IU]/min | INTRAVENOUS | Status: DC
  Administered 2014-11-16: 2 m[IU]/min via INTRAVENOUS

## 2014-11-16 MED ORDER — LIDOCAINE HCL (PF) 1 % IJ SOLN
INTRAMUSCULAR | Status: DC | PRN
  Administered 2014-11-16: 3 mL via EPIDURAL
  Administered 2014-11-16: 2 mL via EPIDURAL
  Administered 2014-11-16: 5 mL via EPIDURAL

## 2014-11-16 MED ORDER — EPHEDRINE 5 MG/ML INJ: 10.0000 mg | INTRAVENOUS | Status: DC | PRN

## 2014-11-16 MED ORDER — FENTANYL 2.5 MCG/ML BUPIVACAINE 1/10 % EPIDURAL INFUSION (WH - ANES)
14.0000 mL/h | INTRAMUSCULAR | Status: DC | PRN
Start: 2014-11-16 — End: 2014-11-17
  Administered 2014-11-16 – 2014-11-17 (×5): 14 mL/h via EPIDURAL
  Filled 2014-11-16 (×4): qty 125

## 2014-11-16 MED ORDER — PENICILLIN G POTASSIUM 5000000 UNITS IJ SOLR
5.0000 10*6.[IU] | Freq: Once | INTRAVENOUS | Status: AC
  Administered 2014-11-16: 5 10*6.[IU] via INTRAVENOUS
  Filled 2014-11-16: qty 5

## 2014-11-16 NOTE — Anesthesia Procedure Notes (Signed)
Epidural Patient location during procedure: OB  Staffing Anesthesiologist: Catalina Gravel Performed by: anesthesiologist   Preanesthetic Checklist Completed: patient identified, pre-op evaluation, timeout performed, IV checked, risks and benefits discussed and monitors and equipment checked  Epidural Patient position: sitting Prep: DuraPrep Patient monitoring: blood pressure and continuous pulse ox Approach: midline Location: L3-L4 Injection technique: LOR air  Needle:  Needle type: Tuohy  Needle gauge: 17 G Needle length: 9 cm Needle insertion depth: 4 cm Catheter size: 19 Gauge Catheter at skin depth: 10 cm Test dose: negative and Other (1% Lidocaine)  Additional Notes Patient identified.  Risk benefits discussed including failed block, incomplete pain control, headache, nerve damage, paralysis, blood pressure changes, nausea, vomiting, reactions to medication both toxic or allergic, and postpartum back pain.  Patient expressed understanding and wished to proceed.  All questions were answered.  Sterile technique used throughout procedure and epidural site dressed with sterile barrier dressing. No paresthesia or other complications noted. The patient did not experience any signs of intravascular injection such as tinnitus or metallic taste in mouth nor signs of intrathecal spread such as rapid motor block. Please see nursing notes for vital signs. Reason for block:procedure for pain

## 2014-11-16 NOTE — Progress Notes (Signed)
Dr. Ernestina Patches ordered fluid challenge be given 541ml bolus.

## 2014-11-16 NOTE — Progress Notes (Signed)
Labor Progress Note- strip review O:  BP 100/50 mmHg  Pulse 81  Temp(Src) 99.3 F (37.4 C) (Oral)  Resp 18  Ht 5\' 1"  (1.549 m)  Wt 151 lb (68.493 kg)  BMI 28.55 kg/m2  SpO2 100%  LMP 12/31/2013 (Approximate)   EFM: 125/mod/+accels/+ late decels on 8 pit (started once on this dose) Toco: q2-5  UOP 260mL in foley  A&P: 34 y.o. [id number] [redacted]w[redacted]d here for induction of labor for h/o IUFDx3  FWB: Cat II, fetus is not tolerating Pit@8 , will decrease to 6 milliunits and assess plan to perform cervical check in 1 hour. Patient had now had 2 episodes of recurrent late decelerations when the pitocin was increased to 8  Low UOP: likely related to fluid status. Will do fluid challenge and reassess after 532mL bolus.   Caren Macadam, MD 10:58 PM

## 2014-11-16 NOTE — Progress Notes (Signed)
Reported to Dr. Ernestina Patches that fetal heart rate having late decelerations after increasing pitocin to 15millinuts. Dr. Ernestina Patches ordered to reduce pitocin back down to 46milliunits and do SVE in about 1 hour to assess for cervical change. Informed Dr. Ernestina Patches that there was only 262ml urine in 11 hours, doing fluid challenge now, may give whole liter of fluid if patient begins to diurese per Dr. Ernestina Patches.

## 2014-11-16 NOTE — Progress Notes (Signed)
Labor Progress Note  [name] [name] is a 34 y.o. G4P0300 at 107w1d  admitted for induction of labor due to recurrent preterm births.  S: Patient now more comfortable after epidural placement.   O:  BP 103/70 mmHg  Pulse 77  Temp(Src) 98.3 F (36.8 C) (Oral)  Resp 16  Ht 5\' 1"  (1.549 m)  Wt 151 lb (68.493 kg)  BMI 28.55 kg/m2  SpO2 100%  LMP 12/31/2013 (Approximate) FHT:  FHR: 125 bpm, variability: moderate,  accelerations:  Present,  decelerations:  Absent UC:   irregular, every 2-4 minutes SVE:   1/80/-3 Membranes intact  Labs: Lab Results  Component Value Date   WBC 6.5 11/15/2014   HGB 10.7* 11/15/2014   HCT 32.3* 11/15/2014   MCV 83.7 11/15/2014   PLT 260 11/15/2014    Assessment / Plan: 34 y.o. G4P0300 [redacted]w[redacted]d not in labor.  Induction of labor due to preterm deliveries  Labor: Not in labor. Continue IOL with cytotec. FB placed. Fetal Wellbeing:  Category I Pain Control:  Epidural Anticipated MOD:  NSVD  Expectant management   Luiz Blare, DO 11/16/2014, 11:28 AM PGY-2, [place]

## 2014-11-16 NOTE — Progress Notes (Signed)
Labor Progress Note  S: called by RN for SROM with mec and variables. Patient is feeling well but worried about her baby and asking if we can give her strong contractions without pitocin.   O:  BP 115/74 mmHg  Pulse 88  Temp(Src) 99.3 F (37.4 C) (Oral)  Resp 18  Ht 5\' 1"  (1.549 m)  Wt 151 lb (68.493 kg)  BMI 28.55 kg/m2  SpO2 100%  LMP 12/31/2013 (Approximate)  CVE: Dilation: 4 Effacement (%): 50 Cervical Position: Anterior Station: -1 Presentation: Vertex Exam by:: n licato rn  Repeated by myself-same exam. Cervix is very edematous circumferentially.   A&P: 34 y.o. G4P0300 [redacted]w[redacted]d here for IOL 2/2 to h/o IUFD x3 #Labor: still latent. now SROM with mec.  Placed IUPC and FSE for closer monitoring. Explained to patient and partner at bedside reasoning for internal monitoring.  #FWB: Cat II- stop pitocin (#2 time) #Cervical exam: very edematous cervix.  #Low UOP: patient is s/p 562ml bolus without appropriate UOP. Blood pressures are low and MMM. Possible foley misplacement vs fibroid obstruction. Will have bladder scan performed to assure low PVR and consider additional boluses.  Caren Macadam, MD 11:24 PM

## 2014-11-16 NOTE — Progress Notes (Addendum)
Labor Progress Note  S: Called by RN for concerning fetal monitoring.   O:  BP 119/63 mmHg  Pulse 78  Temp(Src) 98.5 F (36.9 C) (Oral)  Resp 18  Ht 5\' 1"  (1.549 m)  Wt 151 lb (68.493 kg)  BMI 28.55 kg/m2  SpO2 100%  LMP 12/31/2013 (Approximate)  Dilation: 4.5 Effacement (%): 80 Station: -2 Presentation: Vertex Exam by:: H Koran RNC  EFM: 135/Mod/None/+late decelerations with return to baseline Toco: q2-3 minutes  A&P: 34 y.o. [id number] [redacted]w[redacted]d here for IOL 2/2 to IUFD #Labor: Induction, latent labor.   #FWB: cat II, concerning given still in latent labor.  Decelerations did not respond to fluids or positional change.  -Will give pit break. -Discussed with patient and partner that fetal HR is concerning but no emergent need for intervention.  -Continue to monitor closely -Plan to restart pit and increase by only one given fetal intolerance with 2 by 2 dosing after 92min of reassuring monitoring  #GBS- POS, ordered pcn prophy.   Caren Macadam, MD 5:34 PM

## 2014-11-16 NOTE — Progress Notes (Signed)
Labor Progress Note- strip review O:  BP 111/69 mmHg  Pulse 89  Temp(Src) 98.6 F (37 C) (Axillary)  Resp 18  Ht 5\' 1"  (1.549 m)  Wt 151 lb (68.493 kg)  BMI 28.55 kg/m2  SpO2 100%  LMP 12/31/2013 (Approximate)   EFM: 120/mod/+accels/no decels  A&P: 34 y.o. [id number] [redacted]w[redacted]d here for induction of labor for h/o IUFD FWB: Reactive, improved.   Caren Macadam, MD 7:25 PM

## 2014-11-16 NOTE — Anesthesia Preprocedure Evaluation (Addendum)
Anesthesia Evaluation  Patient identified by MRN, date of birth, ID band Patient awake    Reviewed: Allergy & Precautions, NPO status , Patient's Chart, lab work & pertinent test results  Airway Mallampati: II  TM Distance: >3 FB Neck ROM: Full    Dental no notable dental hx. (+) Teeth Intact   Pulmonary Current Smoker,  breath sounds clear to auscultation  Pulmonary exam normal       Cardiovascular Exercise Tolerance: Good negative cardio ROS Normal cardiovascular examRhythm:Regular Rate:Normal     Neuro/Psych negative neurological ROS     GI/Hepatic negative GI ROS, Neg liver ROS,   Endo/Other  negative endocrine ROS  Renal/GU negative Renal ROS     Musculoskeletal negative musculoskeletal ROS (+)   Abdominal   Peds  Hematology  (+) anemia ,   Anesthesia Other Findings Day of surgery medications reviewed with the patient.  Reproductive/Obstetrics (+) Pregnancy                            Anesthesia Physical Anesthesia Plan  ASA: II  Anesthesia Plan: Epidural   Post-op Pain Management:    Induction:   Airway Management Planned:   Additional Equipment:   Intra-op Plan:   Post-operative Plan:   Informed Consent: I have reviewed the patients History and Physical, chart, labs and discussed the procedure including the risks, benefits and alternatives for the proposed anesthesia with the patient or authorized representative who has indicated his/her understanding and acceptance.   Dental advisory given  Plan Discussed with:   Anesthesia Plan Comments: (Patient identified.  Risk benefits discussed including failed block, incomplete pain control, headache, nerve damage, paralysis, blood pressure changes, nausea, vomiting, reactions to medication both toxic or allergic, and postpartum back pain.  Patient expressed understanding and wished to proceed.  All questions were answered.    )        Anesthesia Quick Evaluation

## 2014-11-17 ENCOUNTER — Encounter (HOSPITAL_COMMUNITY): Payer: Self-pay

## 2014-11-17 ENCOUNTER — Encounter (HOSPITAL_COMMUNITY)
Admission: RE | Disposition: A | Discharge status: Home / Self Care | Payer: Self-pay | Source: Ambulatory Visit | Attending: Family Medicine

## 2014-11-17 DIAGNOSIS — O99324 Drug use complicating childbirth: Secondary | ICD-10-CM

## 2014-11-17 DIAGNOSIS — O99824 Streptococcus B carrier state complicating childbirth: Secondary | ICD-10-CM

## 2014-11-17 DIAGNOSIS — Z3A39 39 weeks gestation of pregnancy: Secondary | ICD-10-CM

## 2014-11-17 DIAGNOSIS — F1290 Cannabis use, unspecified, uncomplicated: Secondary | ICD-10-CM

## 2014-11-17 SURGERY — Surgical Case
Anesthesia: Epidural

## 2014-11-17 MED ORDER — OXYTOCIN 10 UNIT/ML IJ SOLN
INTRAMUSCULAR | Status: AC
  Filled 2014-11-17: qty 4

## 2014-11-17 MED ORDER — ACETAMINOPHEN 325 MG PO TABS
650.0000 mg | ORAL_TABLET | ORAL | Status: DC | PRN
  Administered 2014-11-17 – 2014-11-18 (×2): 650 mg via ORAL
  Filled 2014-11-17 (×3): qty 2

## 2014-11-17 MED ORDER — DIBUCAINE 1 % RE OINT: 1.0000 "application " | TOPICAL_OINTMENT | RECTAL | Status: DC | PRN

## 2014-11-17 MED ORDER — WITCH HAZEL-GLYCERIN EX PADS: 1.0000 "application " | MEDICATED_PAD | CUTANEOUS | Status: DC | PRN

## 2014-11-17 MED ORDER — DIPHENHYDRAMINE HCL 25 MG PO CAPS: 25.0000 mg | ORAL_CAPSULE | Freq: Four times a day (QID) | ORAL | Status: DC | PRN

## 2014-11-17 MED ORDER — SCOPOLAMINE 1 MG/3DAYS TD PT72
MEDICATED_PATCH | TRANSDERMAL | Status: DC | PRN
  Administered 2014-11-17: 1 via TRANSDERMAL

## 2014-11-17 MED ORDER — OXYTOCIN 40 UNITS IN LACTATED RINGERS INFUSION - SIMPLE MED: 62.5000 mL/h | INTRAVENOUS | Status: AC

## 2014-11-17 MED ORDER — ONDANSETRON HCL 4 MG/2ML IJ SOLN
INTRAMUSCULAR | Status: DC | PRN
  Administered 2014-11-17: 4 mg via INTRAVENOUS

## 2014-11-17 MED ORDER — SODIUM BICARBONATE 8.4 % IV SOLN
INTRAVENOUS | Status: DC | PRN
  Administered 2014-11-17 (×2): 5 mL via EPIDURAL

## 2014-11-17 MED ORDER — LACTATED RINGERS IV SOLN
INTRAVENOUS | Status: DC | PRN
  Administered 2014-11-17 (×3): via INTRAVENOUS

## 2014-11-17 MED ORDER — LACTATED RINGERS IV SOLN
INTRAVENOUS | Status: DC | PRN
  Administered 2014-11-17: 19:00:00 via INTRAVENOUS

## 2014-11-17 MED ORDER — SODIUM CHLORIDE 0.9 % IR SOLN
Status: DC | PRN
  Administered 2014-11-17: 1000 mL

## 2014-11-17 MED ORDER — CEFAZOLIN SODIUM-DEXTROSE 2-3 GM-% IV SOLR
INTRAVENOUS | Status: DC | PRN
  Administered 2014-11-17: 2 g via INTRAVENOUS

## 2014-11-17 MED ORDER — LANOLIN HYDROUS EX OINT: 1.0000 "application " | TOPICAL_OINTMENT | CUTANEOUS | Status: DC | PRN

## 2014-11-17 MED ORDER — SODIUM BICARBONATE 8.4 % IV SOLN
INTRAVENOUS | Status: AC
  Filled 2014-11-17: qty 50

## 2014-11-17 MED ORDER — SCOPOLAMINE 1 MG/3DAYS TD PT72
MEDICATED_PATCH | TRANSDERMAL | Status: AC
  Filled 2014-11-17: qty 1

## 2014-11-17 MED ORDER — CEFAZOLIN SODIUM-DEXTROSE 2-3 GM-% IV SOLR
INTRAVENOUS | Status: AC
  Filled 2014-11-17: qty 50

## 2014-11-17 MED ORDER — MEPERIDINE HCL 25 MG/ML IJ SOLN
INTRAMUSCULAR | Status: DC | PRN
  Administered 2014-11-17 (×2): 12.5 mg via INTRAVENOUS

## 2014-11-17 MED ORDER — TETANUS-DIPHTH-ACELL PERTUSSIS 5-2.5-18.5 LF-MCG/0.5 IM SUSP: 0.5000 mL | Freq: Once | INTRAMUSCULAR | Status: DC

## 2014-11-17 MED ORDER — DEXAMETHASONE SODIUM PHOSPHATE 10 MG/ML IJ SOLN
INTRAMUSCULAR | Status: AC
  Filled 2014-11-17: qty 1

## 2014-11-17 MED ORDER — LACTATED RINGERS IV SOLN
INTRAVENOUS | Status: DC
  Administered 2014-11-18: 01:00:00 via INTRAVENOUS

## 2014-11-17 MED ORDER — OXYTOCIN 10 UNIT/ML IJ SOLN
40.0000 [IU] | INTRAMUSCULAR | Status: DC | PRN
  Administered 2014-11-17: 40 [IU] via INTRAVENOUS

## 2014-11-17 MED ORDER — DEXAMETHASONE SODIUM PHOSPHATE 10 MG/ML IJ SOLN
INTRAMUSCULAR | Status: DC | PRN
  Administered 2014-11-17: 10 mg via INTRAVENOUS

## 2014-11-17 MED ORDER — ZOLPIDEM TARTRATE 5 MG PO TABS: 5.0000 mg | ORAL_TABLET | Freq: Every evening | ORAL | Status: DC | PRN

## 2014-11-17 MED ORDER — PRENATAL MULTIVITAMIN CH
1.0000 | ORAL_TABLET | Freq: Every day | ORAL | Status: DC
  Administered 2014-11-18: 1 via ORAL
  Filled 2014-11-17: qty 1

## 2014-11-17 MED ORDER — MORPHINE SULFATE 0.5 MG/ML IJ SOLN
INTRAMUSCULAR | Status: AC
  Filled 2014-11-17: qty 100

## 2014-11-17 MED ORDER — MEPERIDINE HCL 25 MG/ML IJ SOLN
INTRAMUSCULAR | Status: AC
  Filled 2014-11-17: qty 1

## 2014-11-17 MED ORDER — MORPHINE SULFATE (PF) 0.5 MG/ML IJ SOLN
INTRAMUSCULAR | Status: DC | PRN
  Administered 2014-11-17: 3 mg via EPIDURAL

## 2014-11-17 MED ORDER — LIDOCAINE-EPINEPHRINE (PF) 2 %-1:200000 IJ SOLN
INTRAMUSCULAR | Status: AC
  Filled 2014-11-17: qty 20

## 2014-11-17 MED ORDER — ONDANSETRON HCL 4 MG/2ML IJ SOLN
INTRAMUSCULAR | Status: AC
  Filled 2014-11-17: qty 2

## 2014-11-17 MED ORDER — OXYCODONE-ACETAMINOPHEN 5-325 MG PO TABS
2.0000 | ORAL_TABLET | ORAL | Status: DC | PRN
  Administered 2014-11-19 (×3): 2 via ORAL
  Filled 2014-11-17 (×2): qty 2

## 2014-11-17 MED ORDER — OXYCODONE-ACETAMINOPHEN 5-325 MG PO TABS
1.0000 | ORAL_TABLET | ORAL | Status: DC | PRN
  Administered 2014-11-18 – 2014-11-19 (×4): 1 via ORAL
  Filled 2014-11-17 (×6): qty 1

## 2014-11-17 MED ORDER — SIMETHICONE 80 MG PO CHEW
80.0000 mg | CHEWABLE_TABLET | ORAL | Status: DC
  Administered 2014-11-18: 80 mg via ORAL
  Filled 2014-11-17: qty 1

## 2014-11-17 MED ORDER — MENTHOL 3 MG MT LOZG: 1.0000 | LOZENGE | OROMUCOSAL | Status: DC | PRN

## 2014-11-17 MED ORDER — SIMETHICONE 80 MG PO CHEW
80.0000 mg | CHEWABLE_TABLET | Freq: Three times a day (TID) | ORAL | Status: DC
  Administered 2014-11-18 – 2014-11-19 (×3): 80 mg via ORAL
  Filled 2014-11-17 (×3): qty 1

## 2014-11-17 MED ORDER — SIMETHICONE 80 MG PO CHEW
80.0000 mg | CHEWABLE_TABLET | ORAL | Status: DC | PRN
  Administered 2014-11-18: 80 mg via ORAL
  Filled 2014-11-17: qty 1

## 2014-11-17 MED ORDER — SENNOSIDES-DOCUSATE SODIUM 8.6-50 MG PO TABS
2.0000 | ORAL_TABLET | ORAL | Status: DC
  Administered 2014-11-18: 2 via ORAL
  Filled 2014-11-17: qty 2

## 2014-11-17 SURGICAL SUPPLY — 34 items
APL SKNCLS STERI-STRIP NONHPOA (GAUZE/BANDAGES/DRESSINGS) ×1
BENZOIN TINCTURE PRP APPL 2/3 (GAUZE/BANDAGES/DRESSINGS) ×2 IMPLANT
CLAMP CORD UMBIL (MISCELLANEOUS) IMPLANT
CLOSURE WOUND 1/2 X4 (GAUZE/BANDAGES/DRESSINGS) ×1
CLOTH BEACON ORANGE TIMEOUT ST (SAFETY) ×3 IMPLANT
DRAPE SHEET LG 3/4 BI-LAMINATE (DRAPES) IMPLANT
DRSG OPSITE POSTOP 4X10 (GAUZE/BANDAGES/DRESSINGS) ×3 IMPLANT
DURAPREP 26ML APPLICATOR (WOUND CARE) ×3 IMPLANT
ELECT REM PT RETURN 9FT ADLT (ELECTROSURGICAL) ×3
ELECTRODE REM PT RTRN 9FT ADLT (ELECTROSURGICAL) ×1 IMPLANT
EXTRACTOR VACUUM KIWI (MISCELLANEOUS) IMPLANT
GLOVE BIO SURGEON ST LM GN SZ9 (GLOVE) ×3 IMPLANT
GLOVE BIOGEL PI IND STRL 9 (GLOVE) ×1 IMPLANT
GLOVE BIOGEL PI INDICATOR 9 (GLOVE) ×2
GOWN STRL REUS W/TWL 2XL LVL3 (GOWN DISPOSABLE) ×3 IMPLANT
GOWN STRL REUS W/TWL LRG LVL3 (GOWN DISPOSABLE) ×3 IMPLANT
NDL HYPO 25X5/8 SAFETYGLIDE (NEEDLE) IMPLANT
NEEDLE HYPO 25X5/8 SAFETYGLIDE (NEEDLE) IMPLANT
NS IRRIG 1000ML POUR BTL (IV SOLUTION) ×5 IMPLANT
PACK C SECTION WH (CUSTOM PROCEDURE TRAY) ×3 IMPLANT
PAD OB MATERNITY 4.3X12.25 (PERSONAL CARE ITEMS) ×3 IMPLANT
RTRCTR C-SECT PINK 25CM LRG (MISCELLANEOUS) ×2 IMPLANT
RTRCTR C-SECT PINK 34CM XLRG (MISCELLANEOUS) IMPLANT
STRIP CLOSURE SKIN 1/2X4 (GAUZE/BANDAGES/DRESSINGS) ×1 IMPLANT
SUT MNCRL 0 VIOLET CTX 36 (SUTURE) ×2 IMPLANT
SUT MONOCRYL 0 CTX 36 (SUTURE) ×6
SUT VIC AB 0 CT1 27 (SUTURE) ×3
SUT VIC AB 0 CT1 27XBRD ANBCTR (SUTURE) ×1 IMPLANT
SUT VIC AB 2-0 CT1 27 (SUTURE) ×3
SUT VIC AB 2-0 CT1 TAPERPNT 27 (SUTURE) ×1 IMPLANT
SUT VIC AB 4-0 KS 27 (SUTURE) ×3 IMPLANT
SYR BULB IRRIGATION 50ML (SYRINGE) IMPLANT
TOWEL OR 17X24 6PK STRL BLUE (TOWEL DISPOSABLE) ×3 IMPLANT
TRAY FOLEY CATH SILVER 14FR (SET/KITS/TRAYS/PACK) ×3 IMPLANT

## 2014-11-17 NOTE — Transfer of Care (Signed)
Immediate Anesthesia Transfer of Care Note  Patient: [name] [name]  Procedure(s) Performed: Procedure(s): CESAREAN SECTION (N/A)  Patient Location: PACU  Anesthesia Type:Epidural  Level of Consciousness: awake  Airway & Oxygen Therapy: Patient Spontanous Breathing  Post-op Assessment: Report given to RN and Post -op Vital signs reviewed and stable  Post vital signs: stable  Last Vitals:  Filed Vitals:   11/17/14 1836  BP: 97/79  Pulse: 90  Temp:   Resp:     Complications: No apparent anesthesia complications

## 2014-11-17 NOTE — Op Note (Signed)
Cesarean Section Operative Report  [name] [name]  11/15/2014 - 11/17/2014  Indications: Protracted first stage, maternal request, nonreassuring fetal heart tones  Pre-operative Diagnosis: primary cesarean section for failure to progress and non reassuring fetal heart rate.   Post-operative Diagnosis: Same   Surgeon: Surgeon(s) and Role:    * Jonnie Kind, MD - Primary    * Gwynne Edinger, MD - Fellow   Attending Attestation: Mallory Shirk  Assistants: Leta Speller, medical student  Anesthesia: epidural    Estimated Blood Loss: * No blood loss amount entered *   Total IV Fluids: 3000 ml LR   Urine Output: 150 CC OF clear urine  Specimens: none   Baby condition / location:  Couplet care / Skin to Skin 3239 gm APGAR: , 8/9   Complications: no complications  Indications: [name] [name] is a 34 y.o. [id number] with an IUP [redacted]w[redacted]d presenting for IOL 2/2 hx IUFD x3.  Patient's induction began ~72 hours prior to section. Contractions adequate for ~4 hours with minimal to no cervical change or change in fetal station. Also with persistent category 2 FHTs. We discussed continuing versus proceeding to cesarean section and patient clear that preferred to proceed to section.  The risks, benefits, complications, treatment options, and expected outcomes were discussed with the patient . The patient concurred with the proposed plan, giving informed consent. identified as [name] and the procedure verified as C-Section Delivery.  Procedure Details: A Time Out was held and the above information confirmed.  The patient was taken back to the operative suite where epidural anesthesia was continued.  After induction of anesthesia, the patient was draped and prepped in the usual sterile manner and placed in a dorsal supine position with a leftward tilt. A transverse incision was made and carried down through the subcutaneous tissue to the fascia. Fascial incision was made and  extended transversely. The fascia was separated from the underlying rectus tissue superiorly and inferiorly. The peritoneum was identified and entered. Peritoneal incision was extended longitudinally. Alexis retractor placed. A low transverse uterine incision was made. Infant OP and was manually rotated to OA. Delivered from cephalic presentation w/ weight and apgars as above. Cord ph was not sent the umbilical cord was clamped and cut cord blood was obtained for evaluation. The placenta was removed Intact and appeared normal. The uterine outline, tubes and ovaries appeared normal}. The uterine incision was closed with running locked sutures of 39monocryl.   Hemostasis was observed. Lavage was carried out until clear. Peritoneum closed with 2-0 vicryl. The fascia was then reapproximated with running sutures of 0Vicryl. The subcuticular closure was performed using 2-0Vicryl. The skin was closed with 4-0Vicryl.   Instrument, sponge, and needle counts were correct prior the abdominal closure and were correct at the conclusion of the case.     Disposition: PACU - hemodynamically stable.   Maternal Condition: stable       Signed: Patsy Lager WoukMD 11/17/2014 8:21 PM

## 2014-11-17 NOTE — Progress Notes (Signed)
Plan is to restart pitocin at 1 milliunit at 5am and increase by 1. Reported to Dr Ernestina Patches that large amount of fluid was leaking during vaginal exam. Reported SVE. Fetal heart tracing reassuring at this time.

## 2014-11-17 NOTE — Progress Notes (Signed)
Labor Progress Note  S: feeling pelvic pressure  O:  BP 94/45 mmHg  Pulse 74  Temp(Src) 98.9 F (37.2 C) (Oral)  Resp 18  Ht 5\' 1"  (1.549 m)  Wt 151 lb (68.493 kg)  BMI 28.55 kg/m2  SpO2 100%  LMP 12/31/2013 (Approximate)  CVE: Dilation: 5 Effacement (%): 50 Cervical Position: Anterior Station: -2, -1 Presentation: Vertex Exam by:: n licato rn  IFM: 037/VOH/6 accel (15x15)/ early decels TOCO: q4-5 without pitocin  SROM- Meconium, plan for NICU at delivery  A&P: 34 y.o. G6V7034 [redacted]w[redacted]d here for IOL 2/2 to h/o IUFD x3 #Labor: Latent labor, approaching active. KBTC@ 2300 - IUPC and FSE for closer monitoring in place - plan to restart pitocin (#3 attempt), start at 1 increase by 1.   #FWB: Cat 2, currently very low to low risk for fetal acidemia given normal baseline, moderate variability, and only early decelerations. Even with previous tracing on pitocin with late deceleration there was at most moderate risk and increased surveillance with internal monitors was initiated at that time (based on Parer et al model) -Will attempt pitocin again -Continue to monitor for fetal intolerance.  -NICU at delivery given meconium  #GBS: PCN infusiing  Caren Macadam, MD 4:06 AM

## 2014-11-17 NOTE — Progress Notes (Signed)
Patient not voiding significantly since fluid bolus. Dr. Ernestina Patches ordered to restart pitocin at 1 milliunit and increase slowly once fetal tracing is more reactive.

## 2014-11-17 NOTE — Progress Notes (Signed)
Dr Glo Herring updated on pt progress, SVE, length of time since last cervical change, FHr tracing with decels, amt of pit, length of time with adequate Ucs, MVUs, and pt desire for cesarean delivery.  P[rovider at bedside discussing risks and benefits of CS. Pt expresses understanding

## 2014-11-17 NOTE — Progress Notes (Signed)
Dr Fransisca Connors updated on patient status on FHR tracing with late decels, interventions, SVE, length of time since cervical change, amt of pit, mod mec, previous use of amnioinfusion, Ucs, MVUs, and length of time with adequate UCs. Patient expressing desire to "be done with labor process and am ready to move forward to the next step. Will plan to reexam cervix at 1830.

## 2014-11-18 ENCOUNTER — Encounter (HOSPITAL_COMMUNITY): Payer: Self-pay | Admitting: Obstetrics and Gynecology

## 2014-11-18 LAB — CBC
HEMATOCRIT: 29.8 % — AB (ref 36.0–46.0)
Hemoglobin: 10.2 g/dL — ABNORMAL LOW (ref 12.0–15.0)
MCH: 28.2 pg (ref 26.0–34.0)
MCHC: 34.2 g/dL (ref 30.0–36.0)
MCV: 82.3 fL (ref 78.0–100.0)
Platelets: 221 10*3/uL (ref 150–400)
RBC: 3.62 MIL/uL — ABNORMAL LOW (ref 3.87–5.11)
RDW: 15.1 % (ref 11.5–15.5)
WBC: 22.6 10*3/uL — AB (ref 4.0–10.5)

## 2014-11-18 LAB — BIRTH TISSUE RECOVERY COLLECTION (PLACENTA DONATION)

## 2014-11-18 NOTE — Progress Notes (Signed)
Patient states she has decreased the amount she smokes (cigarettes) throughout her pregnancy, to 3-4/day.

## 2014-11-18 NOTE — Anesthesia Postprocedure Evaluation (Signed)
  Anesthesia Post-op Note  Patient: [name] [name]  Procedure(s) Performed: Procedure(s): CESAREAN SECTION (N/A)  Patient Location: PACU  Anesthesia Type:Epidural  Level of Consciousness: awake  Airway and Oxygen Therapy: Patient Spontanous Breathing  Post-op Pain: mild  Post-op Assessment: Post-op Vital signs reviewed, Patient's Cardiovascular Status Stable, Respiratory Function Stable, Patent Airway, No signs of Nausea or vomiting and Pain level controlled LLE Motor Response: Purposeful movement LLE Sensation: Increased RLE Motor Response: Purposeful movement RLE Sensation: Increased      Post-op Vital Signs: Reviewed and stable  Last Vitals:  Filed Vitals:   11/17/14 2300  BP: 113/73  Pulse: 67  Temp:   Resp: 18    Complications: No apparent anesthesia complications

## 2014-11-18 NOTE — Clinical Social Work Maternal (Signed)
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   Referral Source:  [facility]   Address:  [place], [place] [address]  Phone number:  [phone]   Household Members:  Spouse, MOB's mother  Natural Supports (not living in the home):  Immediate Family, Extended Family, Friends   Medical illustrator Supports: None   Employment: Full-time   Type of Work: Cosmotology   Education:    N/A  Pensions consultant:  Multimedia programmer   Other Resources:    None identified  Cultural/Religious Considerations Which May Impact Care:  None reported  Strengths:  Ability to meet basic needs , Home prepared for child    Risk Factors/Current Problems:   1)Substance Use: MOB presents with THC use during the pregnancy to assist with nausea. Infant's UDS is positive for THC, and MDS is pending.  2) MOB with increased risk for developing perinatal mood and anxiety disorders as she has a history of 3 IUFD which resulted in anxiety during the pregnancy.  MOB also continues to adjust to less than ideal birth story.  Cognitive State:  Able to Concentrate , Alert , Linear Thinking , Goal Oriented    Mood/Affect:  Happy , Interested , Calm    CSW Assessment:  CSW received request for consult due to MOB presenting with marijuana use during the pregnancy.  MOB presented as easily engaged and receptive to the visit.  MOB displayed a full range in affect and was in a pleasant mood.  She was interacting and bonding with the infant during the  visit. FOB was also present in the room, but he was quiet and did not participate in the assessment.   MOB reported intense feelings of excitement and happiness secondary to the infant's birth. She stated that she has had 3 previous miscarriages, and currently feels that she is "in heaven" since she has a baby.  MOB endorsed feelings of anxiety during the pregnancy based on her prior history, but denied any lingering symptoms now that the infant has been born and she can see that he is healthy.  MOB stated that she continues to process and adjust to the infant's birth story, since she had felt that she should have a C-section earlier since she was not progressing.  MOB discussed frustration with her experience, and shared that it was emotionally difficult since the infant's cord was wrapped around his neck at birth.  MOB stated that she is feeling better knowing that he is now "okay", but discussed that it continues to be a less than ideal birth story.  CSW continued to provide support as she processed her birth story, and provided education on perinatal mood and anxiety disorders, and how these events and her prior history may impact her transition postpartum.   MOB openly discussed history of marijuana use during the pregnancy. She stated that she was diagnosed with hypermesis gravidarum, and had a difficult time eating and gaining weight.  MOB shared that she "tried everything", but continued to have a difficult time eating and gaining.  MOB shared that it was recommended to her to try marijuana. She discussed that she tried [facility], but continued to have difficulties with nausea.  MOB reported last use of THC 2 weeks ago.  MOB voiced understanding of the hospital drug screen policy.  MOB informed that infant had a +UDS for [facility] and that a CPS report will be made.  MOB asked appropriate questions related to what to anticipate and expect, and MOB denied further questions, concerns, or needs.   CSW  Plan/Description:   1)Patient/Family Education: Perinatal mood and anxiety disorders, hospital drug screen policy 2) [place] Report due to infant with +UDS for marijuana. No additional psychosocial concerns. CPS to follow up in their home.  3) CSW to monitor infant's MDS and will notify CPS of the results.  4)No Further Intervention Required/No Barriers to Discharge    Sharyl Nimrod 11/18/2014, 2:56 PM

## 2014-11-18 NOTE — Anesthesia Postprocedure Evaluation (Addendum)
  Anesthesia Post-op Note  Patient: [name] [name]  Procedure(s) Performed: Procedure(s): CESAREAN SECTION (N/A)  Patient Location: Mother/Baby  Anesthesia Type:Epidural  Level of Consciousness: awake, alert , oriented and patient cooperative  Airway and Oxygen Therapy: Patient Spontanous Breathing  Post-op Pain: mild  Post-op Assessment: Patient's Cardiovascular Status Stable, Respiratory Function Stable, No headache, No backache and Patient able to bend at knees      Post-op Vital Signs: Reviewed and stable  Last Vitals:  Filed Vitals:   11/18/14 0523  BP: 112/77  Pulse: 62  Temp: 36.4 C  Resp:     Complications: No apparent anesthesia complications

## 2014-11-18 NOTE — Progress Notes (Signed)
POSTPARTUM PROGRESS NOTE  Post Partum Day 1 Subjective:  [name] [name] is a 34 y.o. B0S1115 [redacted]w[redacted]d s/p pLTCS.  No acute events overnight.  Pt denies problems with ambulating, voiding or po intake.  She denies nausea or vomiting.  Pain is well controlled.  She has had flatus. She has not had bowel movement.  Lochia Small.  Plan for birth control is Depo-Provera.  Method of Feeding: breast  Objective: Blood pressure 112/77, pulse 62, temperature 97.6 F (36.4 C), temperature source Oral, resp. rate 18, height 5\' 1"  (1.549 m), weight 151 lb (68.493 kg), last menstrual period 12/31/2013, SpO2 98 %, unknown if currently breastfeeding.  Physical Exam:  General: alert, cooperative and no distress Lochia:normal flow Chest: CTAB Heart: RRR no m/r/g Abdomen: +BS, soft, nontender,  Uterine Fundus: firm,  DVT Evaluation: No evidence of DVT seen on physical exam. Extremities: trace pedal edema   Recent Labs  11/15/14 0738 11/18/14 0525  HGB 10.7* 10.2*  HCT 32.3* 29.8*    Assessment/Plan:  ASSESSMENT: [name] [name] is a 34 y.o. [id number] [redacted]w[redacted]d s/p pLTCS 2/2 failed induction (arrest of first stage). Doing well. D/c likely tomorrow. No significant anemia on POD #1 hemoglobin.    LOS: 3 days   Desma Maxim 11/18/2014, 7:29 AM

## 2014-11-18 NOTE — Addendum Note (Signed)
Addendum  created 11/18/14 0837 by Georgeanne Nim, CRNA   Modules edited: Charges VN, Notes Section   Notes Section:  File: [id number]; File: [id number]

## 2014-11-18 NOTE — Lactation Note (Addendum)
This note was copied from the chart of [place]. Lactation Consultation Note Initial visit at 21 hours of age.  Dr asked [facility] to visit mom she thinks baby doesn't like breastfeeding, baby is not holding latch.  Mom has large breast with tough/edematious areolas L>R.  Mom was given hand pump and can demonstrate hand expression.  Hand pumping collects several mls quickly.  Fitted mom for a #27 flange with better fit.  Attempted latch in football hold on left breast baby not opening mouth wide for latch.  Baby holding mouth tightly closed.  Mom allowed baby STS and baby began to show feeding cues.  Baby doesn't latch.  Spoon fed with demonstration several mls of EBM.  Baby licks, but does not open mouth wide.  Tongue is dimpled with tongue not extending past lower gumline with suck training.  Encouraged parents to continue STS and work on suck training.  Mom to spoon feed as needed if baby is not latching and may consider DEBP.  [facility] LC resources given and discussed.  Encouraged to feed with early cues on demand.  Early newborn behavior discussed.  Mom to call for assist as needed.    Patient Name: [name] [name] [id number] Date: 11/18/2014 Reason for consult: Initial assessment   Maternal Data Has patient been taught Hand Expression?: Yes Does the patient have breastfeeding experience prior to this delivery?: No  Feeding Feeding Type: Breast Fed  LATCH Score/Interventions Latch: Too sleepy or reluctant, no latch achieved, no sucking elicited.  Audible Swallowing: None  Type of Nipple: Flat Intervention(s): Reverse pressure;Hand pump  Comfort (Breast/Nipple): Engorged, cracked, bleeding, large blisters, severe discomfort     Hold (Positioning): Assistance needed to correctly position infant at breast and maintain latch. Intervention(s): Breastfeeding basics reviewed;Support Pillows;Position options;Skin to skin  LATCH Score: 2  Lactation Tools Discussed/Used Initiated by::  RN Date initiated:: 11/18/14   Consult Status Consult Status: Follow-up Date: 11/19/14 Follow-up type: In-patient    Justice Britain 11/18/2014, 4:50 PM

## 2014-11-19 ENCOUNTER — Ambulatory Visit (HOSPITAL_COMMUNITY): Payer: 59

## 2014-11-19 MED ORDER — OXYCODONE-ACETAMINOPHEN 5-325 MG PO TABS: 1.0000 | ORAL_TABLET | ORAL | Status: DC | PRN

## 2014-11-19 NOTE — Discharge Instructions (Signed)
Cesarean Delivery, Care After °Refer to this sheet in the next few weeks. These instructions provide you with information on caring for yourself after your procedure. Your health care provider may also give you specific instructions. Your treatment has been planned according to current medical practices, but problems sometimes occur. Call your health care provider if you have any problems or questions after you go home. °HOME CARE INSTRUCTIONS  °· Only take over-the-counter or prescription medications as directed by your health care provider. °· Do not drink alcohol, especially if you are breastfeeding or taking medication to relieve pain. °· Do not chew or smoke tobacco. °· Continue to use good perineal care. Good perineal care includes: °· Wiping your perineum from front to back. °· Keeping your perineum clean. °· Check your surgical cut (incision) daily for increased redness, drainage, swelling, or separation of skin. °· Clean your incision gently with soap and water every day, and then pat it dry. If your health care provider says it is okay, leave the incision uncovered. Use a bandage (dressing) if the incision is draining fluid or appears irritated. If the adhesive strips across the incision do not fall off within 7 days, carefully peel them off. °· Hug a pillow when coughing or sneezing until your incision is healed. This helps to relieve pain. °· Do not use tampons or douche until your health care provider says it is okay. °· Shower, wash your hair, and take tub baths as directed by your health care provider. °· Wear a well-fitting bra that provides breast support. °· Limit wearing support panties or control-top hose. °· Drink enough fluids to keep your urine clear or pale yellow. °· Eat high-fiber foods such as whole grain cereals and breads, brown rice, beans, and fresh fruits and vegetables every day. These foods may help prevent or relieve constipation. °· Resume activities such as climbing stairs,  driving, lifting, exercising, or traveling as directed by your health care provider. °· Talk to your health care provider about resuming sexual activities. This is dependent upon your risk of infection, your rate of healing, and your comfort and desire to resume sexual activity. °· Try to have someone help you with your household activities and your newborn for at least a few days after you leave the hospital. °· Rest as much as possible. Try to rest or take a nap when your newborn is sleeping. °· Increase your activities gradually. °· Keep all of your scheduled postpartum appointments. It is very important to keep your scheduled follow-up appointments. At these appointments, your health care provider will be checking to make sure that you are healing physically and emotionally. °SEEK MEDICAL CARE IF:  °· You are passing large clots from your vagina. Save any clots to show your health care provider. °· You have a foul smelling discharge from your vagina. °· You have trouble urinating. °· You are urinating frequently. °· You have pain when you urinate. °· You have a change in your bowel movements. °· You have increasing redness, pain, or swelling near your incision. °· You have pus draining from your incision. °· Your incision is separating. °· You have painful, hard, or reddened breasts. °· You have a severe headache. °· You have blurred vision or see spots. °· You feel sad or depressed. °· You have thoughts of hurting yourself or your newborn. °· You have questions about your care, the care of your newborn, or medications. °· You are dizzy or light-headed. °· You have a rash. °· You   have pain, redness, or swelling at the site of the removed intravenous access (IV) tube.  You have nausea or vomiting.  You stopped breastfeeding and have not had a menstrual period within 12 weeks of stopping.  You are not breastfeeding and have not had a menstrual period within 12 weeks of delivery.  You have a fever. SEEK  IMMEDIATE MEDICAL CARE IF:  You have persistent pain.  You have chest pain.  You have shortness of breath.  You faint.  You have leg pain.  You have stomach pain.  Your vaginal bleeding saturates 2 or more sanitary pads in 1 hour. MAKE SURE YOU:   Understand these instructions.  Will watch your condition.  Will get help right away if you are not doing well or get worse. Document Released: 12/09/2001 Document Revised: 08/03/2013 Document Reviewed: 11/14/2011 [facility] Patient Information 2015 [place], [place]. This information is not intended to replace advice given to you by your health care provider. Make sure you discuss any questions you have with your health care provider.  Breast Engorgement  Breast engorgement is the overfilling of your breasts with breast milk. In the first few weeks after giving birth, you may experience breast engorgement. Although it is normal for your breasts to feel heavy, full, and uncomfortable within 3-5 days of giving birth, breast engorgement can make your breasts throb and feel hard, tightly stretched, warm, and tender. Engorgement peaks about the fifth day after you give birth. Breast engorgement can be easily treated and does not require you to stop breastfeeding.  CAUSES OF BREAST ENGORGEMENT Some women delay feedings because of sore or cracked nipples, which can lead to engorgement. Cracked and sore nipples often are caused by inadequate latching (when your baby's mouth attaches to your breast to breastfeed). If your baby is latched on properly, he or she should be able to breastfeed as long as needed, without causing any pain. If you do feel pain while breastfeeding, take your baby off your breast and try again. Get help from your health care provider or a lactation consultant if you continue to have pain. Other causes of engorgement include:   Improper position of your baby while breastfeeding.  Allowing too much time to pass between  feedings.  Reduction in breastfeeding because you give your baby water, juice, formula, breast milk from a bottle, or a pacifier instead of breastfeeding.  Changes in your baby's feeding patterns.  Weak sucking from your baby, which causes less milk to be taken out of your breast during feedings.  Fatigue, stress, anemia.  Plugged milk ducts.  A history of breast surgery. SIGNS AND SYMPTOMS OF BREAST ENGORGEMENT If your breasts become engorged, you may experience:   Breast swelling, tenderness, warmth, redness, or throbbing.  Breast hardness and stretching of the skin around your breast.  Flattening, tightening, and hardening of your nipple.  A low-grade fever, which can be confused with a breast infection. RECOMMENDATIONS TO EASE BREAST ENGORGEMENT Breast engorgement should improve in 24-48 hours after following these recommendations:  Breastfeed when you feel the need to reduce the fullness of your breasts or when your baby shows signs of hunger. This is called "breastfeeding on demand."  Newborns (babies younger than 4 weeks) often breastfeed every 1-3 hours during the day. You may need to awaken your baby to feed if he or she is asleep at a feeding time.  Do not allow your baby to sleep longer than 5 hours during the night without a feeding.  Pump  or hand-express breast milk before breastfeeding to soften your breast, areola, and nipple.   Apply warm, moist heat (in the shower or with warm water-soaked hand towels) just before feeding or pumping, or massage your breast before or during breastfeeding. This increases circulation and helps your milk to flow.  Completely empty your breasts when breastfeeding or pumping. Afterward, wear a snug bra (nursing or regular) or tank top for 1-2 days to signal your body to slightly decrease milk production. Only wear snug bras or tank tops to treat engorgement. Tight bras typically should be avoided by breastfeeding mothers. Once  engorgement is relieved, return to wearing regular, loose-fitting clothes.  Apply ice packs to your breasts to lessen the pain from engorgement and relieve swelling, unless the ice is uncomfortable for you.  Do not delay feedings. Try to relax when it is time to feed your baby. This helps to trigger your "let-down reflex," which releases milk from your breast.  Ensure your baby is latched on to your breast and positioned properly while breastfeeding.   Allow your baby to remain at your breast as long as he or she is latched on well and actively sucking. Your baby will let you know when he or she is done breastfeeding by pulling away from your breast or falling asleep.  Avoid introducing bottles or pacifiers to your baby in the early weeks of breastfeeding. Wait to introduce these things until after resolving any breastfeeding challenges.  Try to pump your milk on the same schedule as when your baby would breastfeed if you are returning to work or away from home for an extended period.  Drink plenty of fluids to avoid dehydration, which can eventually put you at greater risk of breast engorgement.  CALL YOUR HEALTH CARE PROVIDER OR LACTATION CONSULTANT IF:   Engorgement lasts longer than 2 days, even after treatment.  You have flu-like symptoms, such as a fever, chills, or body aches.  Your breasts become increasingly red and painful. Document Released: 07/14/2004 Document Revised: 11/19/2012 Document Reviewed: 09/11/2012 [facility] Patient Information 2015 [place], [place]. This information is not intended to replace advice given to you by your health care provider. Make sure you discuss any questions you have with your health care provider.

## 2014-11-19 NOTE — Discharge Summary (Signed)
Obstetric Discharge Summary Reason for Admission: induction of labor Prenatal Procedures: ultrasound Intrapartum Procedures: cesarean: low cervical, transverse Postpartum Procedures: none Complications-Operative and Postpartum: none  Cesarean Section Operative Report  [name] [name]  11/15/2014 - 11/17/2014  Indications: Protracted first stage, maternal request, nonreassuring fetal heart tones  Pre-operative Diagnosis: primary cesarean section for failure to progress and non reassuring fetal heart rate.   Post-operative Diagnosis: Same   Surgeon: Surgeon(s) and Role:  * Jonnie Kind, MD - Primary  * Gwynne Edinger, MD - Fellow   Attending Attestation: Mallory Shirk  Assistants: Leta Speller, medical student  Anesthesia: epidural   Estimated Blood Loss: * No blood loss amount entered *   Total IV Fluids: 3000 ml LR   Urine Output: 150 CC OF clear urine  Specimens: none  Baby condition / location: Couplet care / Skin to Skin 3239 gm APGAR: , 8/9  Complications: no complications  Indications: [name] [name] is a 34 y.o. [id number] with an IUP [redacted]w[redacted]d presenting for IOL 2/2 hx IUFD x3.  Patient's induction began ~72 hours prior to section. Contractions adequate for ~4 hours with minimal to no cervical change or change in fetal station. Also with persistent category 2 FHTs. We discussed continuing versus proceeding to cesarean section and patient clear that preferred to proceed to section.  The risks, benefits, complications, treatment options, and expected outcomes were discussed with the patient . The patient concurred with the proposed plan, giving informed consent. identified as [name] [name] and the procedure verified as C-Section Delivery.  Procedure Details: A Time Out was held and the above information confirmed.  The patient was taken back to the operative suite where epidural anesthesia was continued. After induction of anesthesia, the patient  was draped and prepped in the usual sterile manner and placed in a dorsal supine position with a leftward tilt. A transverse incision was made and carried down through the subcutaneous tissue to the fascia. Fascial incision was made and extended transversely. The fascia was separated from the underlying rectus tissue superiorly and inferiorly. The peritoneum was identified and entered. Peritoneal incision was extended longitudinally. Alexis retractor placed. A low transverse uterine incision was made. Infant OP and was manually rotated to OA. Delivered from cephalic presentation w/ weight and apgars as above. Cord ph was not sent the umbilical cord was clamped and cut cord blood was obtained for evaluation. The placenta was removed Intact and appeared normal. The uterine outline, tubes and ovaries appeared normal}. The uterine incision was closed with running locked sutures of 53monocryl. Hemostasis was observed. Lavage was carried out until clear. Peritoneum closed with 2-0 vicryl. The fascia was then reapproximated with running sutures of 0Vicryl. The subcuticular closure was performed using 2-0Vicryl. The skin was closed with 4-0Vicryl.   Instrument, sponge, and needle counts were correct prior the abdominal closure and were correct at the conclusion of the case.   Disposition: PACU - hemodynamically stable.   Maternal Condition: stable  Signed:  Ennis Forts 11/17/2014 8:21 PM  Hospital Course:  Active Problems:   Indication for care in labor or delivery   Labor, prolonged latent phase   [name] [name] is a 34 y.o. [id number] s/p pLTCS.  Patient was admitted 11/15/2014.  She has a postpartum course that was uncomplicated including no problems with ambulating, PO intake, urination, pain, or bleeding. The pt feels ready to go home and will be discharged with outpatient follow-up.   Today: No acute events overnight.  Pt  denies problems with ambulating, voiding or po intake.  She denies nausea  or vomiting. Endorses pain at incision that is moderately controlled with medication.  She has had flatus. She has not had bowel movement.  Lochia Minimal.  Plan for birth control is Depo-Provera.  Method of Feeding: Breast  Physical Exam:  General: alert, cooperative and no distress Lochia: appropriate Uterine Fundus: firm Incision: healing well, no significant drainage, no significant erythema DVT Evaluation: No cords or calf tenderness. Mild bilateral pedal edema.  H/H: Lab Results  Component Value Date/Time   HGB 10.2* 11/18/2014 05:25 AM   HCT 29.8* 11/18/2014 05:25 AM    Discharge Diagnoses: Term Pregnancy-delivered  Discharge Information: Date: 11/19/2014 Activity: pelvic rest Diet: routine  Medications: PNV, Colace and Percocet Breast feeding:  Yes Condition: stable Instructions: refer to handout Discharge to: home       Discharge Instructions    Call MD for:  difficulty breathing, headache or visual disturbances    Complete by:  As directed      Call MD for:  extreme fatigue    Complete by:  As directed      Call MD for:  persistant dizziness or light-headedness    Complete by:  As directed      Call MD for:  persistant nausea and vomiting    Complete by:  As directed      Call MD for:  redness, tenderness, or signs of infection (pain, swelling, redness, odor or green/yellow discharge around incision site)    Complete by:  As directed      Call MD for:  severe uncontrolled pain    Complete by:  As directed      Call MD for:  temperature >100.4    Complete by:  As directed      Diet - low sodium heart healthy    Complete by:  As directed      Discharge wound care:    Complete by:  As directed   In the shower, let soapy water drip on the wound (don't scrub). Pat it dry. If your skin folds over the incision, put a cloth pad on it to keep it from getting sweaty. Within a week, your wound will be mostly healed. But look out for issues: If you develop a fever or if  the skin surrounding the incision turns red, it starts oozing green or pus-colored liquid, or it becomes hard or painful, call your doctor. These could be signs of infection.     Driving restriction    Complete by:  As directed   Avoid driving for at least 2 weeks.     Lifting restrictions    Complete by:  As directed   Weight restriction of 15 lbs.     Sexual acrtivity    Complete by:  As directed   Pelvic rest, nothing in the vagina for 6 weeks            Medication List    STOP taking these medications        terconazole 0.4 % vaginal cream  Commonly known as:  TERAZOL 7      TAKE these medications        oxyCODONE-acetaminophen 5-325 MG per tablet  Commonly known as:  PERCOCET/ROXICET  Take 1 tablet by mouth every 4 (four) hours as needed (for pain scale 4-7).     prenatal multivitamin Tabs tablet  Take 1 tablet by mouth daily.       Follow-up  Information    Follow up with [facility] In 6 weeks.   Why:  Postpartum Visit   Contact information:   [address] [id number] [place] [phone]      Juanita Craver Zarah Carbon,MD  11/19/2014,9:03 PM

## 2014-11-19 NOTE — Lactation Note (Signed)
This note was copied from the chart of [place]. Lactation Consultation Note  Patient Name: [name] [name] [id number] Date: 11/19/2014 Reason for consult: Follow-up assessment  With this mom and term baby - mom has switched to all formula feeding   Maternal Data    Feeding    LATCH Score/Interventions                      Lactation Tools Discussed/Used     Consult Status Consult Status: Complete Follow-up type: Call as needed    Tonna Corner 11/19/2014, 12:15 PM

## 2014-11-22 ENCOUNTER — Telehealth: Payer: Self-pay

## 2014-11-22 NOTE — Telephone Encounter (Signed)
Pt called requesting a refill on her medication for pain.  "I had c-section a few days ago."

## 2014-11-23 ENCOUNTER — Telehealth: Payer: Self-pay | Admitting: Family Medicine

## 2014-11-23 DIAGNOSIS — Z98891 History of uterine scar from previous surgery: Secondary | ICD-10-CM

## 2014-11-23 MED ORDER — OXYCODONE-ACETAMINOPHEN 5-325 MG PO TABS: 1.0000 | ORAL_TABLET | Freq: Four times a day (QID) | ORAL | Status: DC | PRN

## 2014-11-23 NOTE — Telephone Encounter (Signed)
Discussed patient with RN carrie hillman and I authorized refill. I have given paper Rx for pick up and the patient was informed that this is a one time only refill.   Caren Macadam, MD

## 2014-11-23 NOTE — Telephone Encounter (Signed)
Patient called and left message stating she is calling about a refill. Patient states she had a c/s a week ago and needs a refill on her pain medication. Patient states she has a pain on her left side and burning sensation. Also she still doesn't have feeling back in her hands even after having the baby and wants to know if this is normal. Called patient stating I am returning her phone call and asked what she had been taking for pain since she ran out. Patient states she has been taking extra strength tylenol around the clock. Per chart review, patient is allergic to all nsaids. Spoke with Dr Ernestina Patches who stated she would authorize refill for additional percocet for patient to come by and pick up. Informed patient to continue tylenol but that she could come by and pick up a prescription for some additional percocet. Patient verbalized understanding. Also discussed with patient that the loss of sensation in her hands should improve over the next couple of weeks. Patient verbalized understanding and had no other questions

## 2014-11-23 NOTE — Telephone Encounter (Signed)
-----   Message from Shelly Coss, RN sent at 11/23/2014  5:06 PM EDT ----- Reminder for additional percocet refill. Thanks :)

## 2014-12-01 ENCOUNTER — Ambulatory Visit (INDEPENDENT_AMBULATORY_CARE_PROVIDER_SITE_OTHER): Payer: 59

## 2014-12-01 VITALS — BP 117/75 | HR 89 | Wt 126.9 lb

## 2014-12-01 DIAGNOSIS — Z3042 Encounter for surveillance of injectable contraceptive: Secondary | ICD-10-CM

## 2014-12-01 MED ORDER — MEDROXYPROGESTERONE ACETATE 150 MG/ML IM SUSP
150.0000 mg | Freq: Once | INTRAMUSCULAR | Status: AC
  Administered 2014-12-01: 150 mg via INTRAMUSCULAR

## 2014-12-29 ENCOUNTER — Ambulatory Visit (INDEPENDENT_AMBULATORY_CARE_PROVIDER_SITE_OTHER): Payer: 59 | Admitting: Obstetrics & Gynecology

## 2014-12-29 ENCOUNTER — Encounter: Payer: Self-pay | Admitting: Obstetrics & Gynecology

## 2014-12-29 DIAGNOSIS — N3941 Urge incontinence: Secondary | ICD-10-CM

## 2014-12-29 DIAGNOSIS — R3915 Urgency of urination: Secondary | ICD-10-CM

## 2014-12-29 LAB — POCT URINALYSIS DIP (DEVICE)
BILIRUBIN URINE: NEGATIVE
Glucose, UA: NEGATIVE mg/dL
KETONES UR: NEGATIVE mg/dL
LEUKOCYTES UA: NEGATIVE
Nitrite: NEGATIVE
PH: 7 (ref 5.0–8.0)
Protein, ur: NEGATIVE mg/dL
Specific Gravity, Urine: 1.02 (ref 1.005–1.030)
Urobilinogen, UA: 0.2 mg/dL (ref 0.0–1.0)

## 2014-12-29 NOTE — Progress Notes (Signed)
Patient ID: [name] [name], female   DOB: [birth year], 34 y.o.   MRN: [id number] Subjective:     [name] [name] is a 34 y.o. female who presents for a postpartum visit. She is 6 weeks postpartum following a low cervical transverse Cesarean section. I have fully reviewed the prenatal and intrapartum course. The delivery was at 49 gestational weeks. Outcome: primary cesarean section, low transverse incision. Anesthesia: spinal. Postpartum course has been unremarkable. Baby's course has been unremarkable. Baby is feeding by bottle - Similac Advance. Bleeding staining only. Bowel function is normal. Bladder function is abnormal: urgency and leakage. Patient is sexually active. Contraception method is Depo-Provera injections. Postpartum depression screening: negative. Pt c/o urge sx and urge incontinence.  She denies SUI sx.  She reports that all of her sx developed after she labored. She reports that it is improved since it started.     The following portions of the patient's history were reviewed and updated as appropriate: allergies, current medications, past family history, past medical history, past social history, past surgical history and problem list.  Review of Systems Pertinent items are noted in HPI.   Objective:    BP 146/93 mmHg  Pulse 64  Temp(Src) 99 F (37.2 C)  Wt 132 lb 11.2 oz (60.192 kg)  Breastfeeding? No  Abd; soft, NT, ND. Incision C/D/I  UA: neg Assessment:     6 weeks postpartum exam. Pap smear not done at today's visit.   Urinary sx- postpartum,  Suspect spon resolution. Pt to f/u in 3 months if sx persist despite Kegels  Plan:  Urge incontinence - occ Urge sx- improving developed after del/ improving   1. Contraception: Depo-Provera injections 2.  Follow up in: 3 months if continued urinary frequency or as needed.   3. Reviewed Kegel exercises  Carolyn L. Harraway-Smith, M.D., Cherlynn June

## 2014-12-29 NOTE — Patient Instructions (Signed)
Kegel Exercises The goal of Kegel exercises is to isolate and exercise your pelvic floor muscles. These muscles act as a hammock that supports the rectum, vagina, small intestine, and uterus. As the muscles weaken, the hammock sags and these organs are displaced from their normal positions. Kegel exercises can strengthen your pelvic floor muscles and help you to improve bladder and bowel control, improve sexual response, and help reduce many problems and some discomfort during pregnancy. Kegel exercises can be done anywhere and at any time. HOW TO PERFORM KEGEL EXERCISES 1. Locate your pelvic floor muscles. To do this, squeeze (contract) the muscles that you use when you try to stop the flow of urine. You will feel a tightness in the vaginal area (women) and a tight lift in the rectal area (men and women). 2. When you begin, contract your pelvic muscles tight for 2-5 seconds, then relax them for 2-5 seconds. This is one set. Do 4-5 sets with a short pause in between. 3. Contract your pelvic muscles for 8-10 seconds, then relax them for 8-10 seconds. Do 4-5 sets. If you cannot contract your pelvic muscles for 8-10 seconds, try 5-7 seconds and work your way up to 8-10 seconds. Your goal is 4-5 sets of 10 contractions each day. Keep your stomach, buttocks, and legs relaxed during the exercises. Perform sets of both short and long contractions. Vary your positions. Perform these contractions 3-4 times per day. Perform sets while you are:   Lying in bed in the morning.  Standing at lunch.  Sitting in the late afternoon.  Lying in bed at night. You should do 40-50 contractions per day. Do not perform more Kegel exercises per day than recommended. Overexercising can cause muscle fatigue. Continue these exercises for for at least 15-20 weeks or as directed by your caregiver. Document Released: 03/05/2012 Document Reviewed: 03/05/2012 [employer] Patient Information 2015 [employer]. This information is  not intended to replace advice given to you by your health care provider. Make sure you discuss any questions you have with your health care provider.  

## 2015-01-17 ENCOUNTER — Telehealth: Payer: Self-pay | Admitting: *Deleted

## 2015-01-17 NOTE — Telephone Encounter (Signed)
Received message left on nurse line on 01/17/15 at 0915.  Patient states she delivered 11/17/14 via c/section.  States she is still bleeding.  Questions whether this is normal.  States she is very tired and weak and wants to know can anything be done to stop the bleeding.  Requests a return call.

## 2015-01-18 NOTE — Telephone Encounter (Signed)
Called Rosalene and she states her bleeding never stopped after delivery and it has been about 2 months. I reviewed her postpartum visit notes in which doctor states she was having staining . She reports she told the doctor she was still bleeding.she states she has had to wear a pad everyday since delivery. States currently she wears the big diaper like pads so she doesn't have to change so often- maybe 4 times a day and it is mostly full. States she feels very tired, weak, exhausted , dizzy. I offered her an appointment to come in today, she states she can't - offered one tomorrow but she couldn't come at that time, agreed to appointment on 01/20/15 at 2pm.

## 2015-01-20 ENCOUNTER — Ambulatory Visit (INDEPENDENT_AMBULATORY_CARE_PROVIDER_SITE_OTHER): Payer: 59 | Admitting: Certified Nurse Midwife

## 2015-01-20 ENCOUNTER — Encounter: Payer: Self-pay | Admitting: Certified Nurse Midwife

## 2015-01-20 VITALS — BP 117/77 | HR 98 | Temp 98.8°F | Wt 130.0 lb

## 2015-01-20 DIAGNOSIS — R634 Abnormal weight loss: Secondary | ICD-10-CM | POA: Diagnosis not present

## 2015-01-20 LAB — COMPREHENSIVE METABOLIC PANEL
ALT: 13 U/L (ref 6–29)
AST: 12 U/L (ref 10–30)
Albumin: 4.5 g/dL (ref 3.6–5.1)
Alkaline Phosphatase: 57 U/L (ref 33–115)
BUN: 9 mg/dL (ref 7–25)
CO2: 23 mmol/L (ref 20–31)
Calcium: 9.4 mg/dL (ref 8.6–10.2)
Chloride: 106 mmol/L (ref 98–110)
Creat: 0.73 mg/dL (ref 0.50–1.10)
Glucose, Bld: 101 mg/dL — ABNORMAL HIGH (ref 65–99)
Potassium: 4.1 mmol/L (ref 3.5–5.3)
Sodium: 138 mmol/L (ref 135–146)
Total Bilirubin: 0.4 mg/dL (ref 0.2–1.2)
Total Protein: 7.6 g/dL (ref 6.1–8.1)

## 2015-01-20 LAB — CBC
HCT: 37.4 % (ref 36.0–46.0)
Hemoglobin: 13.2 g/dL (ref 12.0–15.0)
MCH: 28.4 pg (ref 26.0–34.0)
MCHC: 35.3 g/dL (ref 30.0–36.0)
MCV: 80.6 fL (ref 78.0–100.0)
MPV: 9.9 fL (ref 8.6–12.4)
Platelets: 367 10*3/uL (ref 150–400)
RBC: 4.64 MIL/uL (ref 3.87–5.11)
RDW: 14.8 % (ref 11.5–15.5)
WBC: 4.7 10*3/uL (ref 4.0–10.5)

## 2015-01-20 NOTE — Progress Notes (Signed)
Patient ID: [name] [name], female   DOB: 23-Nov-1980, 34 y.o.   MRN: [id number] CC: Abnormal Bleeding    None     HPI [name] [name] is a 34 y.o. [id number] . She is 9 weeks post C/S who presents with continued vaginal bleeding since delivery. She states it varies from spotting to heavier. She has had Depo Provera at 6 weeks postpartum. She states she has lost 17 pounds since her prepregnancy weight   Past Medical History  Diagnosis Date  . Ovarian cyst   . Abnormal Pap smear   . PID (pelvic inflammatory disease)   . Gonorrhea   . Trichomonas   . Fibroids   . Uterine fibroid 02/15/2014  . IUFD (intrauterine fetal death)     OB History  Gravida Para Term Preterm AB SAB TAB Ectopic Multiple Living  4 4 1 3  0 0 0 0 0 1    # Outcome Date GA Lbr Len/2nd Weight Sex Delivery Anes PTL Lv  4 Term 11/17/14 [redacted]w[redacted]d  7 lb 1.9 oz (3.229 kg) [name] EPI  Y  3 Preterm 04/02/00 [redacted]w[redacted]d    Vag-Spont  N FD  2 Preterm 04/02/98 [redacted]w[redacted]d    Vag-Spont  N FD     Complications: Abruptio Placenta  1 Preterm  [redacted]w[redacted]d    Vag-Spont  N FD      Past Surgical History  Procedure Laterality Date  . Diagnostic laparoscopy  May 2012  . Laparoscopy N/A 06/26/2012    Procedure: LAPAROSCOPY OPERATIVE with CHROMOPERTUBATION;  Surgeon: Osborne Oman, MD;  Location: [facility];  Service: Gynecology;  Laterality: N/A;  . Salpingoophorectomy Left 06/26/2012    Procedure: SALPINGO OOPHORECTOMY;  Surgeon: Osborne Oman, MD;  Location: [facility];  Service: Gynecology;  Laterality: Left;  . Cesarean section N/A 11/17/2014    Procedure: CESAREAN SECTION;  Surgeon: Jonnie Kind, MD;  Location: [facility];  Service: Obstetrics;  Laterality: N/A;    Social History   Social History  . Marital Status: Married    Spouse Name: N/A  . Number of Children: N/A  . Years of Education: N/A   Occupational History  . Not on file.   Social History Main Topics  . Smoking status: Current Every Day Smoker -- 0.25 packs/day for 10  years    Types: Cigarettes    Last Attempt to Quit: 04/02/2014  . Smokeless tobacco: Never Used  . Alcohol Use: No  . Drug Use: Yes    Special: Marijuana     Comment: several weeks ago as of 11/10/2014  . Sexual Activity: Yes    Birth Control/ Protection: None     Comment: Last Depo injection 09/01/2012   Other Topics Concern  . Not on file   Social History Narrative    Current Outpatient Prescriptions on File Prior to Visit  Medication Sig Dispense Refill  . acetaminophen (TYLENOL) 500 MG tablet Take 1,000 mg by mouth every 6 (six) hours as needed for mild pain or moderate pain (patient not taking per directions, will take extra 500mg ).    . oxyCODONE-acetaminophen (PERCOCET/ROXICET) 5-325 MG per tablet Take 1 tablet by mouth every 6 (six) hours as needed for severe pain. 20 tablet 0  . Prenatal Vit-Fe Fumarate-FA (PRENATAL MULTIVITAMIN) TABS tablet Take 1 tablet by mouth daily.      No current facility-administered medications on file prior to visit.    Allergies  Allergen Reactions  . Peanut-Containing Drug Products Anaphylaxis  . Spinach Anaphylaxis  .  Nsaids Hives, Itching and Nausea And Vomiting  . Tramadol Itching and Nausea And Vomiting     ROs Review of Systems  Constitutional: Negative for fever.  Genitourinary:       Vaginal bleeding  Neurological: Positive for dizziness.  All other systems reviewed and are negative.  Pertinent items in HPI Physical Exam  Constitutional: She is oriented to person, place, and time and well-developed, well-nourished, and in no distress. No distress.  HENT:  Head: Normocephalic and atraumatic.  Neck: Normal range of motion. No thyromegaly present.  Cardiovascular: Normal rate and normal heart sounds.   Pulmonary/Chest: Effort normal and breath sounds normal. No respiratory distress.  Abdominal: Soft. There is no tenderness.  Musculoskeletal: Normal range of motion.  Neurological: She is alert and oriented to person, place,  and time.  Skin: Skin is warm and dry. She is not diaphoretic.  Psychiatric: Mood, memory, affect and judgment normal.  Nursing note and vitals reviewed.   PHYSICAL EXAM Filed Vitals:   01/20/15 1402  BP: 117/77  Pulse: 98  Temp: 98.8 F (37.1 C)     LAB RESULTS No results found for this or any previous visit (from the past 24 hour(s)).  IMAGING No results found.  MAU COURSE    ASSESSMENT  1. Weight loss   2. AUB  PLAN CBC cmp TSH panel  Continue Prenatal Vitamins See AVS for patient education.    Medication List       This list is accurate as of: 01/20/15  2:18 PM.  Always use your most recent med list.               acetaminophen 500 MG tablet  Commonly known as:  TYLENOL  Take 1,000 mg by mouth every 6 (six) hours as needed for mild pain or moderate pain (patient not taking per directions, will take extra 500mg ).     oxyCODONE-acetaminophen 5-325 MG tablet  Commonly known as:  PERCOCET/ROXICET  Take 1 tablet by mouth every 6 (six) hours as needed for severe pain.     prenatal multivitamin Tabs tablet  Take 1 tablet by mouth daily.          Larey Days, CNM 01/20/2015 2:18 PM

## 2015-01-20 NOTE — Patient Instructions (Signed)

## 2015-01-21 LAB — THYROID PANEL WITH TSH
Free Thyroxine Index: 1.9 (ref 1.4–3.8)
T3 Uptake: 31 % (ref 22–35)
T4, Total: 6.1 ug/dL (ref 4.5–12.0)
TSH: 0.303 u[IU]/mL — ABNORMAL LOW (ref 0.350–4.500)

## 2015-01-28 ENCOUNTER — Telehealth: Payer: Self-pay | Admitting: *Deleted

## 2015-01-28 DIAGNOSIS — N939 Abnormal uterine and vaginal bleeding, unspecified: Secondary | ICD-10-CM

## 2015-01-28 MED ORDER — MEDROXYPROGESTERONE ACETATE 10 MG PO TABS: 10.0000 mg | ORAL_TABLET | Freq: Every day | ORAL | Status: DC

## 2015-01-28 NOTE — Telephone Encounter (Signed)
Pt called and stated that she was seen last week because she has not stopped bleeding since her delivery. She is on depo. Also had labs and wants results. States that at her visit last week she was not given any answers or plan for what to do.

## 2015-01-28 NOTE — Telephone Encounter (Addendum)
Spoke with Dr. Nehemiah Settle about patients concerns, he advised that she can do Provera 10 mg daily and 1 RF. Bleeding will likely get better after next injection.  Patient informed of advice and she is agreeable to plan.

## 2015-02-07 ENCOUNTER — Encounter: Payer: Self-pay | Admitting: *Deleted

## 2015-02-08 ENCOUNTER — Telehealth: Payer: Self-pay | Admitting: General Practice

## 2015-02-08 NOTE — Telephone Encounter (Signed)
Patient called and left message stating she is thinking she is having some postpartum. Patient states her baby cries all the time and wants to be held constantly. Patient states it is beginning to be very overwhelming and would like a call back in regards to what she can do. Called patient and asked if she could come in this afternoon. Patient states she cannot because she already has a couple appointments scheduled. Asked patient if she could come Thursday 11/10 @ 1:15pm with Almyra Free. Patient verbalized understanding and states she can come then. Discussed with patient that if her feelings increased before then or if she has thoughts of harming herself she should go to an ER immediately. Patient verbalized understanding and had no questions

## 2015-02-10 ENCOUNTER — Ambulatory Visit: Payer: 59 | Admitting: Medical

## 2015-02-16 ENCOUNTER — Ambulatory Visit (INDEPENDENT_AMBULATORY_CARE_PROVIDER_SITE_OTHER): Payer: 59 | Admitting: *Deleted

## 2015-02-16 DIAGNOSIS — Z3042 Encounter for surveillance of injectable contraceptive: Secondary | ICD-10-CM | POA: Diagnosis not present

## 2015-02-16 MED ORDER — MEDROXYPROGESTERONE ACETATE 150 MG/ML IM SUSP
150.0000 mg | Freq: Once | INTRAMUSCULAR | Status: AC
  Administered 2015-02-16: 150 mg via INTRAMUSCULAR

## 2015-03-10 ENCOUNTER — Encounter: Payer: Self-pay | Admitting: Certified Nurse Midwife

## 2015-03-10 ENCOUNTER — Ambulatory Visit (INDEPENDENT_AMBULATORY_CARE_PROVIDER_SITE_OTHER): Payer: 59 | Admitting: Certified Nurse Midwife

## 2015-03-10 VITALS — BP 133/80 | HR 78 | Wt 137.7 lb

## 2015-03-10 DIAGNOSIS — N949 Unspecified condition associated with female genital organs and menstrual cycle: Secondary | ICD-10-CM | POA: Diagnosis not present

## 2015-03-10 DIAGNOSIS — R102 Pelvic and perineal pain: Secondary | ICD-10-CM

## 2015-03-10 DIAGNOSIS — Z113 Encounter for screening for infections with a predominantly sexual mode of transmission: Secondary | ICD-10-CM

## 2015-03-10 DIAGNOSIS — N760 Acute vaginitis: Secondary | ICD-10-CM | POA: Diagnosis not present

## 2015-03-10 LAB — WET PREP, GENITAL
Trich, Wet Prep: NONE SEEN
Yeast Wet Prep HPF POC: NONE SEEN

## 2015-03-10 MED ORDER — ACETAMINOPHEN 500 MG PO TABS: 1000.0000 mg | ORAL_TABLET | Freq: Four times a day (QID) | ORAL | Status: DC | PRN

## 2015-03-10 NOTE — Progress Notes (Signed)
Patient ID: [name] [name], female   DOB: 1980/09/17, 34 y.o.   MRN: [id number]   [facility] Aquilla Hacker, MD Phone: [phone]  Subjective:   # Concern for Bacterial Vaginosis - 4 days of vaginal itching / low pelvic pain.  - no discharge, mild bleeding that has been a longstanding issue for her.  - She is on depo medrol birth control.  - She is currently being worked up for her pelvic pain / bleeding and has a follow up appointment with Dr. Harolyn Rutherford for evaluation for hysterectomy.  - she says that she has had BV previously several months ago, and is concerned that hit has returned.  - She denies fever, chills, increasingly worse pelvic pain.  - She denies nausea / vomiting.  - No dysuria, hematuria, or suprapubic pain.  - She has not had any new sexual contact and has been with her current partner for 9 years.  - As far as she knows her partner is monogamous.  - She says that she has not been douching, or using any instruments or cleaning her vaginal area with any items. She says that she does take baths 2-3 times / month.  - She feels that the baths may be contributing to her symptoms.  - She does not smoke.  - She has not taken a probiotic for her symptoms before.   All relevant systems were reviewed and were negative unless otherwise noted in the HPI  Past Medical History Reviewed problem list.  Medications- reviewed and updated Current Outpatient Prescriptions  Medication Sig Dispense Refill  . acetaminophen (TYLENOL) 500 MG tablet Take 1,000 mg by mouth every 6 (six) hours as needed for mild pain or moderate pain (patient not taking per directions, will take extra 500mg ).    . Prenatal Vit-Fe Fumarate-FA (PRENATAL MULTIVITAMIN) TABS tablet Take 1 tablet by mouth daily.     Marland Kitchen acetaminophen (TYLENOL) 500 MG tablet Take 2 tablets (1,000 mg total) by mouth every 6 (six) hours as needed for moderate pain. 30 tablet 0  . medroxyPROGESTERone (PROVERA) 10 MG  tablet Take 1 tablet (10 mg total) by mouth daily. (Patient not taking: Reported on 03/10/2015) 30 tablet 1  . oxyCODONE-acetaminophen (PERCOCET/ROXICET) 5-325 MG per tablet Take 1 tablet by mouth every 6 (six) hours as needed for severe pain. (Patient not taking: Reported on 03/10/2015) 20 tablet 0   No current facility-administered medications for this visit.   Chief complaint-noted No additions to family history Social history- patient is a non smoker  Objective: BP 133/80 mmHg  Pulse 78  Wt 62.46 kg (137 lb 11.2 oz) Gen: NAD, alert, cooperative with exam HEENT: NCAT, EOMI, PERRL Neck: FROM, supple CV: RRR, good S1/S2, no murmur Resp: CTABL, no wheezes, non-labored Abd: SNTND, BS present, no guarding or organomegaly G/U: Speculum exam without evidence of vaginal wall lesions, little to no discharge in the canal, cervical os is intact and not friable, no discharge from the os, no lesions on the cervix, swab for BV / GC/Chlamydia performed.  Bimanual Exam: No CMT, some R Quadrant fullness, pt. With small pelvic outlet, otherwise no abnormalities.  Ext: No edema, warm, normal tone, moves UE/LE spontaneously Neuro: Alert and oriented, No gross deficits Skin: no rashes no lesions  Assessment/Plan:  # Pelvic Discomfort - most likely due to her current bleeding / uterine symptoms. She is planning to undergo evaluation by Dr. Harolyn Rutherford on 1/19. She does not have any discharge, or foul smell, little to  suggest BV or other infection at this time.  - G/C Chlamydia, Wet Prep done.  - No abx at this time, pt will be called with results. Will send Rx if results positive.  - Return precautions reviewed.  - She will come back to see Dr. Harolyn Rutherford in January.

## 2015-03-10 NOTE — Addendum Note (Signed)
Addended by: Riccardo Dubin on: 03/10/2015 03:52 PM   Modules accepted: Orders

## 2015-03-10 NOTE — Patient Instructions (Signed)
Thanks for coming in today.   It does not look like you have BV.   If you develop any of the symptoms that we talked about, then return to see [place]. Otherwise, be sure to follow up with Dr. Harolyn Rutherford on 1/19 to discuss a hysterectomy.   You should take tylenol for your pain. You can take it every 4-6 hours with a maximum dose of 4 grams per day.   Thanks for letting us take care of you.   Sincerely,  Paula Compton, MD      Bacterial Vaginosis Bacterial vaginosis is a vaginal infection that occurs when the normal balance of bacteria in the vagina is disrupted. It results from an overgrowth of certain bacteria. This is the most common vaginal infection in women of childbearing age. Treatment is important to prevent complications, especially in pregnant women, as it can cause a premature delivery. CAUSES  Bacterial vaginosis is caused by an increase in harmful bacteria that are normally present in smaller amounts in the vagina. Several different kinds of bacteria can cause bacterial vaginosis. However, the reason that the condition develops is not fully understood. RISK FACTORS Certain activities or behaviors can put you at an increased risk of developing bacterial vaginosis, including:  Having a new sex partner or multiple sex partners.  Douching.  Using an intrauterine device (IUD) for contraception. Women do not get bacterial vaginosis from toilet seats, bedding, swimming pools, or contact with objects around them. SIGNS AND SYMPTOMS  Some women with bacterial vaginosis have no signs or symptoms. Common symptoms include:  Grey vaginal discharge.  A fishlike odor with discharge, especially after sexual intercourse.  Itching or burning of the vagina and vulva.  Burning or pain with urination. DIAGNOSIS  Your health care provider will take a medical history and examine the vagina for signs of bacterial vaginosis. A sample of vaginal fluid may be taken. Your health care provider  will look at this sample under a microscope to check for bacteria and abnormal cells. A vaginal pH test may also be done.  TREATMENT  Bacterial vaginosis may be treated with antibiotic medicines. These may be given in the form of a pill or a vaginal cream. A second round of antibiotics may be prescribed if the condition comes back after treatment. Because bacterial vaginosis increases your risk for sexually transmitted diseases, getting treated can help reduce your risk for chlamydia, gonorrhea, HIV, and herpes. HOME CARE INSTRUCTIONS   Only take over-the-counter or prescription medicines as directed by your health care provider.  If antibiotic medicine was prescribed, take it as directed. Make sure you finish it even if you start to feel better.  Tell all sexual partners that you have a vaginal infection. They should see their health care provider and be treated if they have problems, such as a mild rash or itching.  During treatment, it is important that you follow these instructions:  Avoid sexual activity or use condoms correctly.  Do not douche.  Avoid alcohol as directed by your health care provider.  Avoid breastfeeding as directed by your health care provider. SEEK MEDICAL CARE IF:   Your symptoms are not improving after 3 days of treatment.  You have increased discharge or pain.  You have a fever. MAKE SURE YOU:   Understand these instructions.  Will watch your condition.  Will get help right away if you are not doing well or get worse. FOR MORE INFORMATION  Centers for Disease Control and Prevention, Division of STD  Prevention: [employer] American Sexual Health Association (ASHA): www.ashastd.org    This information is not intended to replace advice given to you by your health care provider. Make sure you discuss any questions you have with your health care provider.   Document Released: 03/19/2005 Document Revised: 04/09/2014 Document Reviewed:  10/29/2012 Elsevier Interactive Patient Education [employer].

## 2015-03-11 LAB — GC/CHLAMYDIA PROBE AMP (~~LOC~~) NOT AT ARMC
Chlamydia: NEGATIVE
Neisseria Gonorrhea: NEGATIVE

## 2015-03-31 ENCOUNTER — Ambulatory Visit: Payer: 59 | Admitting: Obstetrics & Gynecology

## 2015-04-21 ENCOUNTER — Ambulatory Visit (INDEPENDENT_AMBULATORY_CARE_PROVIDER_SITE_OTHER): Payer: BLUE CROSS/BLUE SHIELD | Admitting: Obstetrics & Gynecology

## 2015-04-21 ENCOUNTER — Encounter: Payer: Self-pay | Admitting: Obstetrics & Gynecology

## 2015-04-21 VITALS — BP 122/73 | HR 87 | Temp 98.3°F | Ht 61.0 in | Wt 141.2 lb

## 2015-04-21 DIAGNOSIS — N731 Chronic parametritis and pelvic cellulitis: Secondary | ICD-10-CM

## 2015-04-21 DIAGNOSIS — R102 Pelvic and perineal pain: Secondary | ICD-10-CM

## 2015-04-21 MED ORDER — TRAMADOL HCL 50 MG PO TABS: 50.0000 mg | ORAL_TABLET | Freq: Four times a day (QID) | ORAL | Status: DC | PRN

## 2015-04-21 NOTE — Progress Notes (Signed)
CLINIC ENCOUNTER NOTE  History:  35 y.o. [id number] here today for discussion of hysterectomy due to chronic PID and pain.  She has deferred this decision until after she had her baby last year.  Now reports increasing pain, wants definitive therapy.  Endorses spotting; on Depo Provera. She denies any other concerns.   Past Medical History  Diagnosis Date  . Ovarian cyst   . Abnormal Pap smear   . PID (pelvic inflammatory disease)   . Gonorrhea   . Trichomonas   . Fibroids   . Uterine fibroid 02/15/2014  . IUFD (intrauterine fetal death)     Past Surgical History  Procedure Laterality Date  . Diagnostic laparoscopy  May 2012  . Laparoscopy N/A 06/26/2012    Procedure: LAPAROSCOPY OPERATIVE with CHROMOPERTUBATION;  Surgeon: Osborne Oman, MD;  Location: [facility];  Service: Gynecology;  Laterality: N/A;  . Salpingoophorectomy Left 06/26/2012    Procedure: SALPINGO OOPHORECTOMY;  Surgeon: Osborne Oman, MD;  Location: [facility];  Service: Gynecology;  Laterality: Left;  . Cesarean section N/A 11/17/2014    Procedure: CESAREAN SECTION;  Surgeon: Jonnie Kind, MD;  Location: [facility];  Service: Obstetrics;  Laterality: N/A;    The following portions of the patient's history were reviewed and updated as appropriate: allergies, current medications, past family history, past medical history, past social history, past surgical history and problem list.   Health Maintenance:  Normal pap and negative HRHPV on  09/07/2013.  Review of Systems:  Pertinent items noted in HPI and remainder of comprehensive ROS otherwise negative.  Objective:  Physical Exam BP 122/73 mmHg  Pulse 87  Temp(Src) 98.3 F (36.8 C) (Oral)  Ht 5\' 1"  (1.549 m)  Wt 141 lb 3.2 oz (64.048 kg)  BMI 26.69 kg/m2  LMP  (LMP Unknown) CONSTITUTIONAL: Well-developed, well-nourished female in no acute distress.  HENT:  Normocephalic, atraumatic. External right and left ear normal. Oropharynx is clear and moist EYES:  Conjunctivae and EOM are normal. Pupils are equal, round, and reactive to light. No scleral icterus.  NECK: Normal range of motion, supple, no masses SKIN: Skin is warm and dry. No rash noted. Not diaphoretic. No erythema. No pallor. Branch: Alert and oriented to person, place, and time. Normal reflexes, muscle tone coordination. No cranial nerve deficit noted. PSYCHIATRIC: Normal mood and affect. Normal behavior. Normal judgment and thought content. CARDIOVASCULAR: Normal heart rate noted RESPIRATORY: Effort and breath sounds normal, no problems with respiration noted ABDOMEN: Soft, no distention noted, mild tenderness to palpation.   PELVIC: Deferred MUSCULOSKELETAL: Normal range of motion. No edema noted.  Labs and Imaging No results found.  Assessment & Plan:  1. Chronic Pelvic pain and Dysmenorrhea 2. Chronic Pelvic Inflammatory Disease - traMADol (ULTRAM) 50 MG tablet; Take 1-2 tablets (50-100 mg total) by mouth every 6 (six) hours as needed for severe pain.  Dispense: 60 tablet; Refill: 0 Patient desires definitive management with hysterectomy.  I proposed doing a total abdominal hysterectomy (TAH) and prophylactic bilateral salpingectomy.  No indication for oophorectomy.  Patient agrees with this proposed surgery.  The risks of surgery were discussed in detail with the patient including but not limited to: bleeding which may require transfusion or reoperation; infection which may require antibiotics; injury to bowel, bladder, ureters or other surrounding organs; need for additional procedures including laparotomy; thromboembolic phenomenon, incisional problems and other postoperative/anesthesia complications.  Patient was also advised that she will remain in house for 1-2 nights; and expected recovery time after a  hysterectomy is 6-8 weeks.  Likelihood of success in alleviating the patient's symptoms was discussed.   She was told that she will be contacted by our surgical scheduler  regarding the time and date of her surgery; routine preoperative instructions of having nothing to eat or drink after midnight on the day prior to surgery and also coming to the hospital 1.5 hours prior to her time of surgery were also emphasized.  She was told she may be called for a preoperative appointment about a week prior to surgery and will be given further preoperative instructions at that visit.  Routine postoperative instructions will be reviewed with the patient and her family in detail after surgery.  In the meantime, she will continue Tramadol and Depo Provera; pain and bleeding precautions were reviewed. Printed patient education handouts about the procedure was given to the patient to review at home.  Routine preventative health maintenance measures emphasized. Please refer to After Visit Summary for other counseling recommendations.   Return in about 3 weeks (around 05/09/2015) for Depo Provera.   Total face-to-face time with patient: 15 minutes. Over 50% of encounter was spent on counseling and coordination of care.   Verita Schneiders, MD, [place] Attending Obstetrician & Gynecologist, [facility]

## 2015-04-22 ENCOUNTER — Encounter (HOSPITAL_COMMUNITY): Payer: Self-pay | Admitting: *Deleted

## 2015-05-10 ENCOUNTER — Telehealth: Payer: Self-pay | Admitting: *Deleted

## 2015-05-10 DIAGNOSIS — B9689 Other specified bacterial agents as the cause of diseases classified elsewhere: Secondary | ICD-10-CM

## 2015-05-10 DIAGNOSIS — N760 Acute vaginitis: Principal | ICD-10-CM

## 2015-05-10 MED ORDER — METRONIDAZOLE 500 MG PO TABS: 500.0000 mg | ORAL_TABLET | Freq: Two times a day (BID) | ORAL | Status: DC

## 2015-05-10 NOTE — Telephone Encounter (Signed)
Per results review wet prep from December show BV that I do not see treatment for.  Patient c/o vaginal discharge at that visit.   Called Korrin and she states she is not sure if she is having discharge/ odor because she is still bleeding some; but would like to get treated Since she was having symptoms. I apologized for the delay in getting her results and sent in prescription today.

## 2015-05-12 ENCOUNTER — Ambulatory Visit: Payer: BLUE CROSS/BLUE SHIELD

## 2015-05-16 ENCOUNTER — Inpatient Hospital Stay (HOSPITAL_COMMUNITY)
Admission: RE | Admit: 2015-05-16 | Discharge: 2015-05-16 | Disposition: A | Discharge status: Home / Self Care | Payer: BLUE CROSS/BLUE SHIELD | Source: Ambulatory Visit

## 2015-05-16 NOTE — Patient Instructions (Signed)
Your procedure is scheduled on: Tuesday, June 07, 2015   Enter through the Main Entrance of [facility] at: 7:30 am   Pick up the phone at the desk and dial [phone].  Call this number if you have problems the morning of surgery: [phone].  Remember: Do NOT eat food: after midnight on Monday  Do NOT drink clear liquids after: after midnight on Monday  Take these medicines the morning of surgery with a SIP OF WATER: none   Do NOT wear jewelry (body piercing), metal hair clips/bobby pins, make-up, or nail polish. Do NOT wear lotions, powders, or perfumes.  You may wear deoderant. Do NOT shave for 48 hours prior to surgery. Do NOT bring valuables to the hospital. Contacts, dentures, or bridgework may not be worn into surgery. Leave suitcase in car.  After surgery it may be brought to your room.  For patients admitted to the hospital, checkout time is 11:00 AM the day of discharge.

## 2015-05-17 ENCOUNTER — Other Ambulatory Visit: Payer: Self-pay | Admitting: Obstetrics & Gynecology

## 2015-05-17 ENCOUNTER — Telehealth: Payer: Self-pay

## 2015-05-17 DIAGNOSIS — N731 Chronic parametritis and pelvic cellulitis: Secondary | ICD-10-CM

## 2015-05-17 DIAGNOSIS — R102 Pelvic and perineal pain: Secondary | ICD-10-CM

## 2015-05-17 MED ORDER — TRAMADOL HCL 50 MG PO TABS: 50.0000 mg | ORAL_TABLET | Freq: Four times a day (QID) | ORAL | Status: DC | PRN

## 2015-05-17 NOTE — Telephone Encounter (Signed)
The refill was called in for patient, patient was called and informed to pick up medication.

## 2015-05-17 NOTE — Telephone Encounter (Signed)
Pt called requesting a refill on her Tramadol from Dr. Harolyn Rutherford.  Encounter routed to Dr. Harolyn Rutherford for advisement.

## 2015-05-31 ENCOUNTER — Encounter (HOSPITAL_COMMUNITY)
Admission: RE | Admit: 2015-05-31 | Discharge: 2015-05-31 | Disposition: A | Discharge status: Home / Self Care | Payer: BLUE CROSS/BLUE SHIELD | Source: Ambulatory Visit | Attending: Obstetrics & Gynecology | Admitting: Obstetrics & Gynecology

## 2015-05-31 ENCOUNTER — Encounter (HOSPITAL_COMMUNITY): Payer: Self-pay

## 2015-05-31 DIAGNOSIS — Z01812 Encounter for preprocedural laboratory examination: Secondary | ICD-10-CM | POA: Insufficient documentation

## 2015-05-31 LAB — CBC
HCT: 36.3 % (ref 36.0–46.0)
Hemoglobin: 12.2 g/dL (ref 12.0–15.0)
MCH: 27.5 pg (ref 26.0–34.0)
MCHC: 33.6 g/dL (ref 30.0–36.0)
MCV: 81.8 fL (ref 78.0–100.0)
PLATELETS: 341 10*3/uL (ref 150–400)
RBC: 4.44 MIL/uL (ref 3.87–5.11)
RDW: 13.4 % (ref 11.5–15.5)
WBC: 4.3 10*3/uL (ref 4.0–10.5)

## 2015-05-31 LAB — TYPE AND SCREEN
ABO/RH(D): O POS
ANTIBODY SCREEN: NEGATIVE

## 2015-05-31 NOTE — Patient Instructions (Addendum)
Your procedure is scheduled on:  Tuesday, June 07, 2015  Enter through the [employer] [facility] at:  7:30 AM  Pick up the phone at the desk and dial [phone].  Call this number if you have problems the morning of surgery: [phone].  Remember:  Do NOT eat food or drink after:  Midnight Monday, June 06, 2015  Take these medicines the morning of surgery with a SIP OF WATER:  None  Do NOT wear jewelry (body piercing), metal hair clips/bobby pins, or nail polish. Do NOT wear lotions, powders, or perfumes.  You may wear deoderant. Do NOT shave for 48 hours prior to surgery. Do NOT bring valuables to the hospital.  Leave suitcase in car.  After surgery it may be brought to your room.  For patients admitted to the hospital, checkout time is 11:00 AM the day of discharge.

## 2015-06-03 ENCOUNTER — Telehealth: Payer: Self-pay

## 2015-06-03 NOTE — Telephone Encounter (Addendum)
[employer] pre-cert initiated for surgery scheduled on 3/7.  Reference # [id number].  Clinical notes requested to be faxed to [phone].   3/6  1000  Fax notification received stating that surgery has been approved

## 2015-06-03 NOTE — Telephone Encounter (Signed)
This patient is having surgery next week Hysterotomy and this needs to be pre-cert. Pt has [place]

## 2015-06-06 MED ORDER — DEXTROSE 5 % IV SOLN
2.0000 g | INTRAVENOUS | Status: AC
  Administered 2015-06-07: 2 g via INTRAVENOUS
  Filled 2015-06-06: qty 2

## 2015-06-07 ENCOUNTER — Inpatient Hospital Stay (HOSPITAL_COMMUNITY): Payer: BLUE CROSS/BLUE SHIELD | Admitting: Anesthesiology

## 2015-06-07 ENCOUNTER — Inpatient Hospital Stay (HOSPITAL_COMMUNITY)
Admission: RE | Admit: 2015-06-07 | Discharge: 2015-06-09 | DRG: 743 | Disposition: A | Discharge status: Home / Self Care | Payer: BLUE CROSS/BLUE SHIELD | Source: Ambulatory Visit | Attending: Obstetrics & Gynecology | Admitting: Obstetrics & Gynecology

## 2015-06-07 ENCOUNTER — Encounter (HOSPITAL_COMMUNITY)
Admission: RE | Disposition: A | Discharge status: Home / Self Care | Payer: Self-pay | Source: Ambulatory Visit | Attending: Obstetrics & Gynecology

## 2015-06-07 ENCOUNTER — Encounter (HOSPITAL_COMMUNITY): Payer: Self-pay | Admitting: Emergency Medicine

## 2015-06-07 DIAGNOSIS — N731 Chronic parametritis and pelvic cellulitis: Principal | ICD-10-CM | POA: Diagnosis present

## 2015-06-07 DIAGNOSIS — F17210 Nicotine dependence, cigarettes, uncomplicated: Secondary | ICD-10-CM | POA: Diagnosis present

## 2015-06-07 DIAGNOSIS — N939 Abnormal uterine and vaginal bleeding, unspecified: Secondary | ICD-10-CM | POA: Diagnosis present

## 2015-06-07 DIAGNOSIS — R102 Pelvic and perineal pain: Secondary | ICD-10-CM | POA: Diagnosis present

## 2015-06-07 DIAGNOSIS — Z90710 Acquired absence of both cervix and uterus: Secondary | ICD-10-CM | POA: Diagnosis present

## 2015-06-07 DIAGNOSIS — D252 Subserosal leiomyoma of uterus: Secondary | ICD-10-CM | POA: Diagnosis present

## 2015-06-07 DIAGNOSIS — G8929 Other chronic pain: Secondary | ICD-10-CM | POA: Diagnosis present

## 2015-06-07 HISTORY — PX: ABDOMINAL HYSTERECTOMY: SHX81

## 2015-06-07 LAB — PREGNANCY, URINE: PREG TEST UR: NEGATIVE

## 2015-06-07 SURGERY — HYSTERECTOMY, ABDOMINAL
Anesthesia: General

## 2015-06-07 MED ORDER — ACETAMINOPHEN 500 MG PO TABS
1000.0000 mg | ORAL_TABLET | Freq: Four times a day (QID) | ORAL | Status: DC
  Filled 2015-06-07 (×2): qty 2

## 2015-06-07 MED ORDER — LACTATED RINGERS IV SOLN
INTRAVENOUS | Status: DC
  Administered 2015-06-07 (×3): via INTRAVENOUS

## 2015-06-07 MED ORDER — MAGNESIUM CITRATE PO SOLN
1.0000 | Freq: Once | ORAL | Status: AC | PRN
  Administered 2015-06-08: 1 via ORAL
  Filled 2015-06-07: qty 296

## 2015-06-07 MED ORDER — BISACODYL 10 MG RE SUPP
10.0000 mg | Freq: Every day | RECTAL | Status: DC | PRN
  Administered 2015-06-08: 10 mg via RECTAL
  Filled 2015-06-07: qty 1

## 2015-06-07 MED ORDER — HYDROMORPHONE HCL 1 MG/ML IJ SOLN
INTRAMUSCULAR | Status: DC | PRN
  Administered 2015-06-07 (×2): 0.5 mg via INTRAVENOUS

## 2015-06-07 MED ORDER — DEXAMETHASONE SODIUM PHOSPHATE 4 MG/ML IJ SOLN
INTRAMUSCULAR | Status: DC | PRN
  Administered 2015-06-07: 8 mg via INTRAVENOUS

## 2015-06-07 MED ORDER — HYDROMORPHONE HCL 1 MG/ML IJ SOLN
0.5000 mg | INTRAMUSCULAR | Status: DC | PRN
  Administered 2015-06-07: 1 mg via INTRAVENOUS
  Filled 2015-06-07: qty 1

## 2015-06-07 MED ORDER — MIDAZOLAM HCL 2 MG/2ML IJ SOLN
INTRAMUSCULAR | Status: DC | PRN
  Administered 2015-06-07: 2 mg via INTRAVENOUS

## 2015-06-07 MED ORDER — LIDOCAINE HCL (CARDIAC) 20 MG/ML IV SOLN
INTRAVENOUS | Status: AC
  Filled 2015-06-07: qty 5

## 2015-06-07 MED ORDER — SCOPOLAMINE 1 MG/3DAYS TD PT72
MEDICATED_PATCH | TRANSDERMAL | Status: AC
  Administered 2015-06-07: 1.5 mg via TRANSDERMAL
  Filled 2015-06-07: qty 1

## 2015-06-07 MED ORDER — CITRIC ACID-SODIUM CITRATE 334-500 MG/5ML PO SOLN
ORAL | Status: AC
  Administered 2015-06-07: 30 mL via ORAL
  Filled 2015-06-07: qty 15

## 2015-06-07 MED ORDER — GLYCOPYRROLATE 0.2 MG/ML IJ SOLN
INTRAMUSCULAR | Status: DC | PRN
  Administered 2015-06-07: 0.4 mg via INTRAVENOUS

## 2015-06-07 MED ORDER — ROCURONIUM BROMIDE 100 MG/10ML IV SOLN
INTRAVENOUS | Status: DC | PRN
  Administered 2015-06-07: 10 mg via INTRAVENOUS
  Administered 2015-06-07: 30 mg via INTRAVENOUS

## 2015-06-07 MED ORDER — FENTANYL CITRATE (PF) 100 MCG/2ML IJ SOLN
INTRAMUSCULAR | Status: AC
  Filled 2015-06-07: qty 2

## 2015-06-07 MED ORDER — DIPHENHYDRAMINE HCL 50 MG/ML IJ SOLN: 12.5000 mg | Freq: Four times a day (QID) | INTRAMUSCULAR | Status: DC | PRN

## 2015-06-07 MED ORDER — FENTANYL CITRATE (PF) 100 MCG/2ML IJ SOLN
INTRAMUSCULAR | Status: DC | PRN
  Administered 2015-06-07 (×2): 50 ug via INTRAVENOUS
  Administered 2015-06-07: 100 ug via INTRAVENOUS
  Administered 2015-06-07: 50 ug via INTRAVENOUS
  Administered 2015-06-07: 100 ug via INTRAVENOUS
  Administered 2015-06-07: 50 ug via INTRAVENOUS
  Administered 2015-06-07: 100 ug via INTRAVENOUS
  Administered 2015-06-07: 50 ug via INTRAVENOUS

## 2015-06-07 MED ORDER — DOCUSATE SODIUM 100 MG PO CAPS
100.0000 mg | ORAL_CAPSULE | Freq: Two times a day (BID) | ORAL | Status: DC
  Administered 2015-06-08 – 2015-06-09 (×3): 100 mg via ORAL
  Filled 2015-06-07 (×3): qty 1

## 2015-06-07 MED ORDER — ZOLPIDEM TARTRATE 5 MG PO TABS
5.0000 mg | ORAL_TABLET | Freq: Every evening | ORAL | Status: DC | PRN
  Administered 2015-06-08: 5 mg via ORAL
  Filled 2015-06-07: qty 1

## 2015-06-07 MED ORDER — SCOPOLAMINE 1 MG/3DAYS TD PT72
1.0000 | MEDICATED_PATCH | Freq: Once | TRANSDERMAL | Status: DC
  Administered 2015-06-07: 1.5 mg via TRANSDERMAL

## 2015-06-07 MED ORDER — ONDANSETRON HCL 4 MG/2ML IJ SOLN
INTRAMUSCULAR | Status: AC
  Filled 2015-06-07: qty 2

## 2015-06-07 MED ORDER — PANTOPRAZOLE SODIUM 40 MG PO TBEC
40.0000 mg | DELAYED_RELEASE_TABLET | Freq: Every day | ORAL | Status: DC
  Administered 2015-06-08 – 2015-06-09 (×2): 40 mg via ORAL
  Filled 2015-06-07 (×2): qty 1

## 2015-06-07 MED ORDER — KETOROLAC TROMETHAMINE 30 MG/ML IJ SOLN
INTRAMUSCULAR | Status: AC
  Filled 2015-06-07: qty 1

## 2015-06-07 MED ORDER — ONDANSETRON HCL 4 MG/2ML IJ SOLN
4.0000 mg | Freq: Four times a day (QID) | INTRAMUSCULAR | Status: DC | PRN
  Administered 2015-06-07: 4 mg via INTRAVENOUS
  Filled 2015-06-07: qty 2

## 2015-06-07 MED ORDER — HYDROMORPHONE HCL 1 MG/ML IJ SOLN
INTRAMUSCULAR | Status: AC
  Filled 2015-06-07: qty 1

## 2015-06-07 MED ORDER — HYDROMORPHONE HCL 1 MG/ML IJ SOLN
0.2500 mg | INTRAMUSCULAR | Status: DC | PRN
  Administered 2015-06-07 (×3): 0.5 mg via INTRAVENOUS

## 2015-06-07 MED ORDER — OXYCODONE-ACETAMINOPHEN 5-325 MG PO TABS: 1.0000 | ORAL_TABLET | ORAL | Status: DC | PRN

## 2015-06-07 MED ORDER — LACTATED RINGERS IV SOLN
INTRAVENOUS | Status: DC
  Administered 2015-06-07: 13:00:00 via INTRAVENOUS

## 2015-06-07 MED ORDER — MIDAZOLAM HCL 2 MG/2ML IJ SOLN
INTRAMUSCULAR | Status: AC
  Filled 2015-06-07: qty 2

## 2015-06-07 MED ORDER — CITRIC ACID-SODIUM CITRATE 334-500 MG/5ML PO SOLN
30.0000 mL | ORAL | Status: AC
  Administered 2015-06-07: 30 mL via ORAL

## 2015-06-07 MED ORDER — ACETAMINOPHEN 10 MG/ML IV SOLN
1000.0000 mg | Freq: Once | INTRAVENOUS | Status: AC
  Administered 2015-06-07: 1000 mg via INTRAVENOUS
  Filled 2015-06-07: qty 100

## 2015-06-07 MED ORDER — ONDANSETRON HCL 4 MG PO TABS: 4.0000 mg | ORAL_TABLET | Freq: Four times a day (QID) | ORAL | Status: DC | PRN

## 2015-06-07 MED ORDER — DIPHENHYDRAMINE HCL 12.5 MG/5ML PO ELIX: 12.5000 mg | ORAL_SOLUTION | Freq: Four times a day (QID) | ORAL | Status: DC | PRN

## 2015-06-07 MED ORDER — SENNOSIDES-DOCUSATE SODIUM 8.6-50 MG PO TABS: 1.0000 | ORAL_TABLET | Freq: Every evening | ORAL | Status: DC | PRN

## 2015-06-07 MED ORDER — SIMETHICONE 80 MG PO CHEW
80.0000 mg | CHEWABLE_TABLET | Freq: Four times a day (QID) | ORAL | Status: DC | PRN
  Administered 2015-06-08 – 2015-06-09 (×2): 80 mg via ORAL
  Filled 2015-06-07 (×2): qty 1

## 2015-06-07 MED ORDER — BUPIVACAINE HCL (PF) 0.5 % IJ SOLN
INTRAMUSCULAR | Status: DC | PRN
  Administered 2015-06-07: 30 mL

## 2015-06-07 MED ORDER — ONDANSETRON HCL 4 MG/2ML IJ SOLN
INTRAMUSCULAR | Status: DC | PRN
  Administered 2015-06-07: 4 mg via INTRAVENOUS

## 2015-06-07 MED ORDER — LACTATED RINGERS IV SOLN: INTRAVENOUS | Status: DC

## 2015-06-07 MED ORDER — LORAZEPAM 2 MG/ML IJ SOLN: 1.0000 mg | Freq: Four times a day (QID) | INTRAMUSCULAR | Status: DC | PRN

## 2015-06-07 MED ORDER — MENTHOL 3 MG MT LOZG
1.0000 | LOZENGE | OROMUCOSAL | Status: DC | PRN
  Administered 2015-06-08: 3 mg via ORAL
  Filled 2015-06-07: qty 9

## 2015-06-07 MED ORDER — NEOSTIGMINE METHYLSULFATE 10 MG/10ML IV SOLN
INTRAVENOUS | Status: DC | PRN
  Administered 2015-06-07: 3 mg via INTRAVENOUS

## 2015-06-07 MED ORDER — ROCURONIUM BROMIDE 100 MG/10ML IV SOLN
INTRAVENOUS | Status: AC
  Filled 2015-06-07: qty 1

## 2015-06-07 MED ORDER — LIDOCAINE HCL (CARDIAC) 20 MG/ML IV SOLN
INTRAVENOUS | Status: DC | PRN
  Administered 2015-06-07: 60 mg via INTRAVENOUS

## 2015-06-07 MED ORDER — GLYCOPYRROLATE 0.2 MG/ML IJ SOLN
INTRAMUSCULAR | Status: AC
  Filled 2015-06-07: qty 2

## 2015-06-07 MED ORDER — PROPOFOL 10 MG/ML IV BOLUS
INTRAVENOUS | Status: DC | PRN
  Administered 2015-06-07: 50 mg via INTRAVENOUS
  Administered 2015-06-07: 150 mg via INTRAVENOUS

## 2015-06-07 MED ORDER — SODIUM CHLORIDE 0.9% FLUSH
9.0000 mL | INTRAVENOUS | Status: DC | PRN
Start: 2015-06-07 — End: 2015-06-08

## 2015-06-07 MED ORDER — HYDROMORPHONE 1 MG/ML IV SOLN
INTRAVENOUS | Status: DC
  Administered 2015-06-07: 13:00:00 via INTRAVENOUS
  Filled 2015-06-07: qty 25

## 2015-06-07 MED ORDER — DEXAMETHASONE SODIUM PHOSPHATE 10 MG/ML IJ SOLN
INTRAMUSCULAR | Status: AC
  Filled 2015-06-07: qty 1

## 2015-06-07 MED ORDER — HYDROMORPHONE HCL 1 MG/ML IJ SOLN
INTRAMUSCULAR | Status: AC
  Administered 2015-06-07: 0.5 mg via INTRAVENOUS
  Filled 2015-06-07: qty 1

## 2015-06-07 MED ORDER — FENTANYL CITRATE (PF) 250 MCG/5ML IJ SOLN
INTRAMUSCULAR | Status: AC
  Filled 2015-06-07: qty 5

## 2015-06-07 MED ORDER — ALUM & MAG HYDROXIDE-SIMETH 200-200-20 MG/5ML PO SUSP: 30.0000 mL | ORAL | Status: DC | PRN

## 2015-06-07 MED ORDER — BUPIVACAINE HCL (PF) 0.5 % IJ SOLN
INTRAMUSCULAR | Status: AC
  Filled 2015-06-07: qty 30

## 2015-06-07 MED ORDER — PROPOFOL 10 MG/ML IV BOLUS
INTRAVENOUS | Status: AC
  Filled 2015-06-07: qty 40

## 2015-06-07 MED ORDER — NEOSTIGMINE METHYLSULFATE 10 MG/10ML IV SOLN
INTRAVENOUS | Status: AC
  Filled 2015-06-07: qty 1

## 2015-06-07 MED ORDER — NALOXONE HCL 0.4 MG/ML IJ SOLN: 0.4000 mg | INTRAMUSCULAR | Status: DC | PRN

## 2015-06-07 MED ORDER — HYDROMORPHONE 1 MG/ML IV SOLN
INTRAVENOUS | Status: DC
  Administered 2015-06-07: 4.3 mg via INTRAVENOUS
  Administered 2015-06-08: 1.99 mg via INTRAVENOUS
  Administered 2015-06-08: 2 mg via INTRAVENOUS
  Administered 2015-06-08: 2 mL via INTRAVENOUS

## 2015-06-07 SURGICAL SUPPLY — 48 items
APL SKNCLS STERI-STRIP NONHPOA (GAUZE/BANDAGES/DRESSINGS) ×1
BARRIER ADHS 3X4 INTERCEED (GAUZE/BANDAGES/DRESSINGS) IMPLANT
BENZOIN TINCTURE PRP APPL 2/3 (GAUZE/BANDAGES/DRESSINGS) ×2 IMPLANT
BRR ADH 4X3 ABS CNTRL BYND (GAUZE/BANDAGES/DRESSINGS)
CANISTER SUCT 3000ML (MISCELLANEOUS) ×3 IMPLANT
CELLS DAT CNTRL 66122 CELL SVR (MISCELLANEOUS) IMPLANT
CLOSURE WOUND 1/2 X4 (GAUZE/BANDAGES/DRESSINGS) ×1
CLOTH BEACON ORANGE TIMEOUT ST (SAFETY) ×3 IMPLANT
CONT PATH 16OZ SNAP LID 3702 (MISCELLANEOUS) ×3 IMPLANT
DECANTER SPIKE VIAL GLASS SM (MISCELLANEOUS) ×2 IMPLANT
DRAPE WARM FLUID 44X44 (DRAPE) IMPLANT
DRSG OPSITE POSTOP 4X10 (GAUZE/BANDAGES/DRESSINGS) ×3 IMPLANT
DURAPREP 26ML APPLICATOR (WOUND CARE) ×3 IMPLANT
ELECT LIGASURE SHORT 9 REUSE (ELECTRODE) IMPLANT
GAUZE SPONGE 4X4 16PLY XRAY LF (GAUZE/BANDAGES/DRESSINGS) IMPLANT
GLOVE BIOGEL PI IND STRL 7.0 (GLOVE) ×3 IMPLANT
GLOVE BIOGEL PI INDICATOR 7.0 (GLOVE) ×6
GLOVE ECLIPSE 7.0 STRL STRAW (GLOVE) ×3 IMPLANT
GOWN STRL REUS W/TWL LRG LVL3 (GOWN DISPOSABLE) ×6 IMPLANT
HEMOSTAT SURGICEL 4X8 (HEMOSTASIS) ×2 IMPLANT
NEEDLE HYPO 22GX1.5 SAFETY (NEEDLE) ×3 IMPLANT
NS IRRIG 1000ML POUR BTL (IV SOLUTION) ×3 IMPLANT
PACK ABDOMINAL GYN (CUSTOM PROCEDURE TRAY) ×3 IMPLANT
PAD OB MATERNITY 4.3X12.25 (PERSONAL CARE ITEMS) ×3 IMPLANT
PENCIL SMOKE EVAC W/HOLSTER (ELECTROSURGICAL) ×3 IMPLANT
RETRACTOR WND ALEXIS 18 MED (MISCELLANEOUS) IMPLANT
RETRACTOR WND ALEXIS 25 LRG (MISCELLANEOUS) IMPLANT
RTRCTR WOUND ALEXIS 18CM MED (MISCELLANEOUS)
RTRCTR WOUND ALEXIS 25CM LRG (MISCELLANEOUS)
SPONGE GAUZE 4X4 12PLY STER LF (GAUZE/BANDAGES/DRESSINGS) ×3 IMPLANT
SPONGE LAP 18X18 X RAY DECT (DISPOSABLE) ×6 IMPLANT
STAPLER VISISTAT 35W (STAPLE) IMPLANT
STRIP CLOSURE SKIN 1/2X4 (GAUZE/BANDAGES/DRESSINGS) ×2 IMPLANT
SUT PDS AB 0 CTX 60 (SUTURE) ×2 IMPLANT
SUT PLAIN 2 0 (SUTURE)
SUT PLAIN ABS 2-0 CT1 27XMFL (SUTURE) IMPLANT
SUT VIC AB 0 CT1 18XCR BRD8 (SUTURE) ×3 IMPLANT
SUT VIC AB 0 CT1 27 (SUTURE) ×18
SUT VIC AB 0 CT1 27XBRD ANBCTR (SUTURE) ×1 IMPLANT
SUT VIC AB 0 CT1 8-18 (SUTURE) ×9
SUT VIC AB 0 CTX 36 (SUTURE)
SUT VIC AB 0 CTX36XBRD ANBCTRL (SUTURE) IMPLANT
SUT VIC AB 4-0 KS 27 (SUTURE) IMPLANT
SUT VICRYL 0 TIES 12 18 (SUTURE) ×3 IMPLANT
SYR CONTROL 10ML LL (SYRINGE) ×3 IMPLANT
TOWEL OR 17X24 6PK STRL BLUE (TOWEL DISPOSABLE) ×6 IMPLANT
TRAY FOLEY CATH SILVER 14FR (SET/KITS/TRAYS/PACK) ×3 IMPLANT
WATER STERILE IRR 1000ML POUR (IV SOLUTION) ×3 IMPLANT

## 2015-06-07 NOTE — Op Note (Signed)
[name] PROCEDURE DATE: 06/07/2015  PREOPERATIVE DIAGNOSES:  Chronic pelvic inflammatory disease, chronic pelvic pain, abnormal uterine bleeding, previous left salpingooophorectomy POSTOPERATIVE DIAGNOSES:  The same SURGEON:   Verita Schneiders, M.D. ASSISTANT: Clovia Cuff, M.D. and Pauline Aus, MS3 OPERATION:  Total abdominal hysterectomy, Right Salpingectomy  INDICATIONS: The patient is a 35 y.o. [id number] with the aforementioned diagnoses who desires definitive surgical management. On the preoperative visit, the risks, benefits, indications, and alternatives of the procedure were reviewed with the patient.  On the day of surgery, the risks of surgery were again discussed with the patient including but not limited to: bleeding which may require transfusion or reoperation; infection which may require antibiotics; injury to bowel, bladder, ureters or other surrounding organs; need for additional procedures; thromboembolic phenomenon, incisional problems and other postoperative/anesthesia complications. Written informed consent was obtained.    OPERATIVE FINDINGS: A 10 week size uterus with normal appearing right adnexa.  However, adhesions seen involving right adnexa and right side of uterus.  Adhesions were also present in area of surgically absent left adnexa.  Dense adhesions of bladder to lower uterine segment were noted.  Minimal intraperitoneal adhesive disease noted.    ANESTHESIA:  General endotracheal. ESTIMATED BLOOD LOSS: 200 ml FLUIDS:  2000 ml of Lactated Ringers URINE OUTPUT:  100 ml of clear yellow urine. SPECIMENS:  Uterus, cervix, right fallopian tube sent to pathology COMPLICATIONS:  None immediate.  DESCRIPTION OF PROCEDURE:  The patient received intravenous antibiotics and had sequential compression devices applied to her lower extremities while in the preoperative area.   She was taken to the operating room and placed under general anesthesia without difficulty.The abdomen  and perineum were prepped and draped in a sterile manner, and she was placed in a dorsal supine position.  A Foley catheter was inserted into the bladder and attached to constant drainage. After an adequate timeout was performed, a Pfannensteil skin incision was made over her preexisting cesarean section scar. This incision was taken down to the fascia using electrocautery with care given to maintain good hemostasis. The fascia was incised in the midline and the fascial incision was then extended bilaterally using electrocautery without difficulty. The fascia and rectus muscles were split bluntly in the midline and transected horizontally in a modified Mallard fashion.  The peritoneum was entered sharply without complication. This peritoneal incision was then extended superiorly and inferiorly with care given to prevent bowel or bladder injury.  Attention was then turned to the pelvis. A retractor was placed into the incision, and the bowel was packed away with moist laparotomy sponges. The uterus at this point was noted to be mobilized and was delivered up out of the abdomen. The adnexal adhesions were lysed using blunt methods.   The round ligaments on each side were clamped, suture ligated with 0 Vicryl, and transected with electrocautery allowing entry into the broad ligament. Of note, all sutures used in this procedure are 0 Vicryl unless otherwise noted. The anterior and posterior leaves of the broad ligament were separated, and the ureters were inspected to be safely away from the area of dissection bilaterally.  The right adnexa was clamped,, cut, and doubly suture ligated.  Kelly clamps were placed on the mesosalpinx of the right fallopian tube, and the fallopian tube was excised. Of note the distal part of the fallopian tube containing the fimbriae was able to be removed, the proximal portion was tightly adherent to the ovary and was left in place.  The pedicle was then sutured  ligated.    A bladder flap  was then created with moderate difficulty given that the bladder was densely adherent to the uterus.  The bladder was then sharply dissected off the lower uterine segment and cervix with good hemostasis noted. The uterine arteries were then skeletonized bilaterally and then clamped, cut, and doubly suture ligated with care given to prevent ureteral injury. The uterosacral ligaments were then clamped, cut, and ligated bilaterally.  Finally, the cardinal ligaments were clamped, cut, and ligated bilaterally.  Acutely curved clamps were placed across the vagina just under the cervix, and the specimen was amputated and sent to pathology. The vaginal cuff angles were closed with Heaney stiches with care given to incorporate the uterosacral-cardinal ligament pedicles on both sides. The middle of the vaginal cuff was closed with a running interlocking stitch with care given to incorporate the anterior pubocervical fascia and the posterior rectovaginal fascia.   The pelvis was irrigated and hemostasis was reconfirmed at all pedicles and along the pelvic sidewall.  Surgicel was placed around the lysed adnexal adhesions to help with hemostasis.  The ureters were inspected and noted to be peristalsing bilaterally.  All laparotomy sponges and instruments were removed from the abdomen. The peritoneum was closed with a running stitch, and the fascia was also closed in a running fashion. The subcutaneous layer was injected with 30 ml of 0.5% Marcaine.  The skin was closed with a 4-0 Vicryl subcuticular stitch. Sponge, lap, needle, and instrument counts were correct times two. The patient was taken to the recovery area awake, extubated and in stable condition.    Verita Schneiders, MD, [place] [facility], [facility]

## 2015-06-07 NOTE — Anesthesia Postprocedure Evaluation (Signed)
Anesthesia Post Note  Patient: [name] [name]  Procedure(s) Performed: Procedure(s) (LRB): HYSTERECTOMY TOTAL ABDOMINALwith  right salpingectomy. (N/A)  Patient location during evaluation: [facility] Anesthesia Type: General Level of consciousness: awake, awake and alert, oriented and patient cooperative Pain management: pain level controlled Vital Signs Assessment: post-procedure vital signs reviewed and stable Respiratory status: spontaneous breathing, nonlabored ventilation and respiratory function stable Cardiovascular status: stable Postop Assessment: no signs of nausea or vomiting Anesthetic complications: no    Last Vitals:  Filed Vitals:   06/07/15 1215 06/07/15 1315  BP: 147/76 134/81  Pulse: 88 90  Temp: 36.5 C 36.5 C  Resp: 20 20    Last Pain:  Filed Vitals:   06/07/15 1342  PainSc: 10-Worst pain ever                 Alia Parsley L

## 2015-06-07 NOTE — Anesthesia Postprocedure Evaluation (Signed)
Anesthesia Post Note  Patient: [name] [name]  Procedure(s) Performed: Procedure(s) (LRB): HYSTERECTOMY TOTAL ABDOMINALwith  right salpingectomy. (N/A)  Patient location during evaluation: PACU Anesthesia Type: General Level of consciousness: sedated Pain management: satisfactory to patient Vital Signs Assessment: post-procedure vital signs reviewed and stable Respiratory status: spontaneous breathing Cardiovascular status: stable Anesthetic complications: no    Last Vitals:  Filed Vitals:   06/07/15 1200 06/07/15 1215  BP: 145/98 147/76  Pulse: 91 88  Temp:  36.5 C  Resp: 20 20    Last Pain:  Filed Vitals:   06/07/15 1231  PainSc: Asleep                 Jammi Morrissette EDWARD

## 2015-06-07 NOTE — H&P (Signed)
Preoperative History and Physical  [name] [name] is a 35 y.o. [id number] here for surgical management of chronic PID and pain.  She is s/p laparoscopic LSO in 2014 for recurrent ovarian cysts; chronic PID visualized during that surgery. No significant preoperative concerns.  Proposed surgery: Total abdominal hysterectomy (TAH) and prophylactic salpingectomy.  Past Medical History  Diagnosis Date  . Ovarian cyst   . Abnormal Pap smear   . PID (pelvic inflammatory disease)   . Gonorrhea   . Trichomonas   . Fibroids   . Uterine fibroid 02/15/2014  . IUFD (intrauterine fetal death)    Past Surgical History  Procedure Laterality Date  . Diagnostic laparoscopy  May 2012  . Laparoscopy N/A 06/26/2012    Procedure: LAPAROSCOPY OPERATIVE with CHROMOPERTUBATION;  Surgeon: Osborne Oman, MD;  Location: [facility];  Service: Gynecology;  Laterality: N/A;  . Salpingoophorectomy Left 06/26/2012    Procedure: SALPINGO OOPHORECTOMY;  Surgeon: Osborne Oman, MD;  Location: [facility];  Service: Gynecology;  Laterality: Left;  . Cesarean section N/A 11/17/2014    Procedure: CESAREAN SECTION;  Surgeon: Jonnie Kind, MD;  Location: [facility];  Service: Obstetrics;  Laterality: N/A;   OB History  Gravida Para Term Preterm AB SAB TAB Ectopic Multiple Living  4 4 1 3  0 0 0 0 0 1    # Outcome Date GA Lbr Len/2nd Weight Sex Delivery Anes PTL Lv  4 Term 11/17/14 [redacted]w[redacted]d  7 lb 1.9 oz (3.229 kg) M CS-LTranv EPI  Y  3 Preterm 04/02/00 [redacted]w[redacted]d    Vag-Spont  N FD  2 Preterm 04/02/98 [redacted]w[redacted]d    Vag-Spont  N FD     Complications: Abruptio Placenta  1 Preterm  [redacted]w[redacted]d    Vag-Spont  N FD     GYN History: Normal pap and negative HRHPV on 09/07/2013. Patient denies any other pertinent gynecologic issues.   No current facility-administered medications on file prior to encounter.   No current outpatient prescriptions on file prior to encounter.   Allergies  Allergen Reactions  . Peanut-Containing Drug Products  Anaphylaxis  . Spinach Anaphylaxis  . Nsaids Hives, Itching and Nausea And Vomiting    Social History:   reports that she has been smoking Cigarettes.  She has a 2.5 pack-year smoking history. She has never used smokeless tobacco. She reports that she uses illicit drugs (Marijuana). She reports that she does not drink alcohol.  Family History  Problem Relation Age of Onset  . Anesthesia problems Neg Hx   . Cystic fibrosis Mother   . Hypertension Mother   . Diabetes Mother     Review of Systems: Noncontributory  PHYSICAL EXAM: Blood pressure 117/74, pulse 70, temperature 98.1 F (36.7 C), temperature source Oral, resp. rate 18, last menstrual period 06/07/2015, SpO2 100 %, not currently breastfeeding. CONSTITUTIONAL: Well-developed, well-nourished female in no acute distress.  HENT:  Normocephalic, atraumatic, External right and left ear normal. Oropharynx is clear and moist EYES: Conjunctivae and EOM are normal. Pupils are equal, round, and reactive to light. No scleral icterus.  NECK: Normal range of motion, supple, no masses SKIN: Skin is warm and dry. No rash noted. Not diaphoretic. No erythema. No pallor. NEUROLOGIC: Alert and oriented to person, place, and time. Normal reflexes, muscle tone coordination. No cranial nerve deficit noted. PSYCHIATRIC: Normal mood and affect. Normal behavior. Normal judgment and thought content. CARDIOVASCULAR: Normal heart rate noted, regular rhythm RESPIRATORY: Effort and breath sounds normal, no problems with respiration noted ABDOMEN: Soft,  nontender, nondistended. Well-healed Pfannenstiel incision PELVIC: Deferred MUSCULOSKELETAL: Normal range of motion. No edema and no tenderness. 2+ distal pulses.  Labs: Results for orders placed or performed during the hospital encounter of 06/07/15 (from the past 336 hour(s))  Pregnancy, urine   Collection Time: 06/07/15  7:32 AM  Result Value Ref Range   Preg Test, Ur NEGATIVE NEGATIVE  Results for  orders placed or performed during the hospital encounter of 05/31/15 (from the past 336 hour(s))  CBC   Collection Time: 05/31/15 11:05 AM  Result Value Ref Range   WBC 4.3 4.0 - 10.5 K/uL   RBC 4.44 3.87 - 5.11 MIL/uL   Hemoglobin 12.2 12.0 - 15.0 g/dL   HCT 36.3 36.0 - 46.0 %   MCV 81.8 78.0 - 100.0 fL   MCH 27.5 26.0 - 34.0 pg   MCHC 33.6 30.0 - 36.0 g/dL   RDW 13.4 11.5 - 15.5 %   Platelets 341 150 - 400 K/uL  Type and screen   Collection Time: 05/31/15 11:05 AM  Result Value Ref Range   ABO/RH(D) O POS    Antibody Screen NEG    Sample Expiration 06/14/2015     Imaging Studies: 02/09/2014 TRANSABDOMINAL AND TRANSVAGINAL ULTRASOUND OF PELVIS  CLINICAL DATA: Pelvic pain, status post left salpingo-oophorectomy, history of uterine fibroids, right ovarian cysts, and PID  COMPARISON: 08/31/2013  FINDINGS: Uterus  Measurements: 9.9 x 5.4 x 5.9 cm. 2.6 x 2.5 x 2.7 cm subserosal fibroid in the right uterine body.  Endometrium  Thickness: 13 mm. No focal abnormality visualized.  Right ovary  Measurements: 5.1 x 2.7 x 3.0 cm. Normal appearance/no adnexal mass.  Left ovary  Surgically absent.  Other findings  No free fluid.  IMPRESSION: 2.7 cm subserosal fibroid in the right uterine body.  Left ovary is surgically absent.   Electronically Signed  By: Julian Hy M.D.  On: 02/09/2014 09:22    Assessment: Patient Active Problem List   Diagnosis Date Noted  . H/O anaphylaxis 11/25/2013  . 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 Chronic Pelvic Inflammatory Disease 06/26/2012  . Chronic Pelvic pain and Dysmenorrhea 11/01/2010    Plan: Patient will undergo surgical management with total abdominal hysterectomy (TAH) and prophylactic salpingectomy.   The risks of surgery were discussed in detail with the patient including but not limited to: bleeding which may require transfusion or  reoperation; infection which may require antibiotics; injury to bowel, bladder, ureters or other surrounding organs; need for additional procedures including laparotomy; thromboembolic phenomenon, incisional problems and other postoperative/anesthesia complications. Discussed possibility of removing right ovary if it is very abnormal/concerning which will lead to need for hormone replacement therapy.  Also discussed possibility of supracervical hysterectomy in the event of extensive adhesive disease; this would mean she will continue to have pap smears for cervical cancer screening.  Had in-depth conversation with patient that her pelvic pain may not be resolved entirely after surgery; she may still get recurrent right ovarian cysts which may need further management or other pelvic pain management modalities.   Patient was also advised that she will remain in house for 2 nights; and expected recovery time after a hysterectomy is 6-8 weeks. The patient concurred with the proposed plan, giving informed written consent for the surgery.  Patient has been NPO since last night she will remain NPO for procedure.  Anesthesia and OR aware.  Preoperative prophylactic antibiotics and SCDs ordered on call to the OR.  To  OR when ready.   Verita Schneiders, M.D. 06/07/2015 8:09 AM

## 2015-06-07 NOTE — Anesthesia Procedure Notes (Signed)
Procedure Name: Intubation Date/Time: 06/07/2015 9:10 AM Performed by: Flossie Dibble Pre-anesthesia Checklist: Emergency Drugs available, Timeout performed, Patient identified, Patient being monitored and Suction available Patient Re-evaluated:Patient Re-evaluated prior to inductionOxygen Delivery Method: Circle system utilized Preoxygenation: Pre-oxygenation with 100% oxygen Intubation Type: IV induction Ventilation: Mask ventilation without difficulty Laryngoscope Size: Mac and 3 Grade View: Grade II Tube type: Oral Tube size: 7.0 mm Number of attempts: 1 Airway Equipment and Method: Stylet Placement Confirmation: ETT inserted through vocal cords under direct vision,  breath sounds checked- equal and bilateral and positive ETCO2 Secured at: 21 cm Tube secured with: Tape Dental Injury: Teeth and Oropharynx as per pre-operative assessment

## 2015-06-07 NOTE — Anesthesia Preprocedure Evaluation (Signed)
Anesthesia Evaluation  Patient identified by MRN, date of birth, ID band Patient awake    Reviewed: Allergy & Precautions, H&P , Patient's Chart, lab work & pertinent test results, reviewed documented beta blocker date and time   Airway Mallampati: II  TM Distance: >3 FB Neck ROM: full    Dental no notable dental hx.    Pulmonary Current Smoker,    Pulmonary exam normal breath sounds clear to auscultation       Cardiovascular  Rhythm:regular Rate:Normal     Neuro/Psych    GI/Hepatic   Endo/Other    Renal/GU      Musculoskeletal   Abdominal   Peds  Hematology   Anesthesia Other Findings   Reproductive/Obstetrics                             Anesthesia Physical Anesthesia Plan  ASA: II  Anesthesia Plan: General   Post-op Pain Management:    Induction: Intravenous  Airway Management Planned: Oral ETT  Additional Equipment:   Intra-op Plan:   Post-operative Plan: Extubation in OR  Informed Consent: I have reviewed the patients History and Physical, chart, labs and discussed the procedure including the risks, benefits and alternatives for the proposed anesthesia with the patient or authorized representative who has indicated his/her understanding and acceptance.   Dental Advisory Given and Dental advisory given  Plan Discussed with: CRNA and Surgeon  Anesthesia Plan Comments: (  Discussed general anesthesia, including possible nausea, instrumentation of airway, sore throat,pulmonary aspiration, etc. I asked if the were any outstanding questions, or  concerns before we proceeded. )        Anesthesia Quick Evaluation

## 2015-06-07 NOTE — Addendum Note (Signed)
Addendum  created 06/07/15 1523 by Raenette Rover, CRNA   Modules edited: Clinical Notes   Clinical Notes:  File: [id number]

## 2015-06-07 NOTE — Transfer of Care (Signed)
Immediate Anesthesia Transfer of Care Note  Patient: [name] [name]  Procedure(s) Performed: Procedure(s): HYSTERECTOMY TOTAL ABDOMINALwith  right salpingectomy. (N/A)  Patient Location: PACU  Anesthesia Type:General  Level of Consciousness: awake, alert , oriented and patient cooperative  Airway & Oxygen Therapy: Patient Spontanous Breathing and Patient connected to nasal cannula oxygen  Post-op Assessment: Report given to RN and Post -op Vital signs reviewed and stable  Post vital signs: Reviewed and stable  Last Vitals:  Filed Vitals:   06/07/15 0756  BP: 117/74  Pulse: 70  Temp: 36.7 C  Resp: 18    Complications: No apparent anesthesia complications

## 2015-06-08 ENCOUNTER — Encounter (HOSPITAL_COMMUNITY): Payer: Self-pay | Admitting: Obstetrics & Gynecology

## 2015-06-08 LAB — CBC
HCT: 33.4 % — ABNORMAL LOW (ref 36.0–46.0)
Hemoglobin: 11.2 g/dL — ABNORMAL LOW (ref 12.0–15.0)
MCH: 27.3 pg (ref 26.0–34.0)
MCHC: 33.5 g/dL (ref 30.0–36.0)
MCV: 81.5 fL (ref 78.0–100.0)
PLATELETS: 290 10*3/uL (ref 150–400)
RBC: 4.1 MIL/uL (ref 3.87–5.11)
RDW: 13.3 % (ref 11.5–15.5)
WBC: 12 10*3/uL — AB (ref 4.0–10.5)

## 2015-06-08 MED ORDER — SODIUM CHLORIDE 0.9% FLUSH
3.0000 mL | INTRAVENOUS | Status: DC | PRN
Start: 2015-06-08 — End: 2015-06-09

## 2015-06-08 MED ORDER — LORAZEPAM 1 MG PO TABS
1.0000 mg | ORAL_TABLET | Freq: Four times a day (QID) | ORAL | Status: DC | PRN
Start: 2015-06-08 — End: 2015-06-09

## 2015-06-08 MED ORDER — HYDROMORPHONE HCL 2 MG PO TABS
2.0000 mg | ORAL_TABLET | ORAL | Status: DC | PRN
  Administered 2015-06-08: 2 mg via ORAL
  Administered 2015-06-08 – 2015-06-09 (×5): 4 mg via ORAL
  Filled 2015-06-08 (×3): qty 2
  Filled 2015-06-08: qty 1
  Filled 2015-06-08 (×2): qty 2

## 2015-06-08 MED ORDER — SODIUM CHLORIDE 0.9 % IV SOLN: 250.0000 mL | INTRAVENOUS | Status: DC | PRN

## 2015-06-08 MED ORDER — PROMETHAZINE HCL 25 MG PO TABS
12.5000 mg | ORAL_TABLET | Freq: Four times a day (QID) | ORAL | Status: DC | PRN
  Administered 2015-06-08 (×2): 12.5 mg via ORAL
  Filled 2015-06-08 (×2): qty 1

## 2015-06-08 MED ORDER — ACETAMINOPHEN 500 MG PO TABS
1000.0000 mg | ORAL_TABLET | Freq: Four times a day (QID) | ORAL | Status: DC | PRN
  Administered 2015-06-08: 1000 mg via ORAL

## 2015-06-08 MED ORDER — SODIUM CHLORIDE 0.9% FLUSH: 3.0000 mL | Freq: Two times a day (BID) | INTRAVENOUS | Status: DC

## 2015-06-08 NOTE — Progress Notes (Signed)
Pt informed of policy on smoking  And MD notifyed  Of   pts desire  To smoke  DR Harolyn Rutherford    notifyed  Pt also

## 2015-06-08 NOTE — Progress Notes (Signed)
1 Day Post-Op Procedure(s) (LRB): HYSTERECTOMY TOTAL ABDOMINALwith right salpingectomy. (N/A)  Subjective: Patient reports nausea, incisional pain and tolerating PO.  No flatus yet.  Voiding adequately.  Objective: I have reviewed patient's vital signs, intake and output, medications and labs.  General: alert and no distress Resp: clear to auscultation bilaterally Cardio: regular rate and rhythm GI: soft, non-tender; bowel sounds normal; no masses,  no organomegaly and incision: clean, dry, intact and no drainage present Extremities: extremities normal, atraumatic, no cyanosis or edema and Homans sign is negative, no sign of DVT Vaginal Bleeding: minimal  Assessment: s/p Procedure(s): HYSTERECTOMY TOTAL ABDOMINALwith  right salpingectomy. (N/A): progressing well and tolerating diet  Plan: Advance diet Encourage ambulation Advance to PO medication Discontinue IV fluids  Discharge home tomorrow if remains stable   LOS: 1 day    [name] [name] A, MD 06/08/2015, 9:10 AM

## 2015-06-09 ENCOUNTER — Encounter (HOSPITAL_COMMUNITY): Payer: Self-pay | Admitting: *Deleted

## 2015-06-09 MED ORDER — OXYCODONE-ACETAMINOPHEN 5-325 MG PO TABS: 1.0000 | ORAL_TABLET | Freq: Four times a day (QID) | ORAL | Status: DC | PRN

## 2015-06-09 MED ORDER — DOCUSATE SODIUM 100 MG PO CAPS: 100.0000 mg | ORAL_CAPSULE | Freq: Two times a day (BID) | ORAL | Status: DC

## 2015-06-09 NOTE — Discharge Instructions (Signed)
Abdominal Hysterectomy  Abdominal hysterectomy is a surgical procedure to remove your womb (uterus). Your uterus is the muscular organ that contains a developing baby. This surgery is done for many reasons. You may need an abdominal hysterectomy if you have cancer, growths (tumors), long-term pain, or bleeding. You may also have this procedure if your uterus has slipped down into your vagina (uterine prolapse).  Depending on why you need an abdominal hysterectomy, you may also have other reproductive organs removed. These could include the part of your vagina that connects with your uterus (cervix), the organs that make eggs (ovaries), and the tubes that connect the ovaries to the uterus (fallopian tubes).  LET YOUR HEALTH CARE PROVIDER KNOW ABOUT:    Any allergies you have.   All medicines you are taking, including vitamins, herbs, eye drops, creams, and over-the-counter medicines.   Previous problems you or members of your family have had with the use of anesthetics.   Any blood disorders you have.   Previous surgeries you have had.   Medical conditions you have.  RISKS AND COMPLICATIONS  Generally, this is a safe procedure. However, as with any procedure, problems can occur. Infection is the most common problem after an abdominal hysterectomy. Other possible problems include:   Bleeding.   Formation of blood clots that may break free and travel to your lungs.   Injury to other organs near your uterus.   Nerve injury causing nerve pain.   Decreased interest in sex or pain during sexual intercourse.  BEFORE THE PROCEDURE   Abdominal hysterectomy is a major surgical procedure. It can affect the way you feel about yourself. Talk to your health care provider about the physical and emotional changes hysterectomy may cause.   You may need to have blood work and X-rays done before surgery.   Quit smoking if you smoke. Ask your health care provider for help if you are struggling to quit.   Stop taking  medicines that thin your blood as directed by your health care provider.   You may be instructed to take antibiotic medicines or laxatives before surgery.   Do not eat or drink anything for 6-8 hours before surgery.   Take your regular medicines with a small sip of water.   Bathe or shower the night or morning before surgery.  PROCEDURE   Abdominal hysterectomy is done in the operating room at the hospital.   In most cases, you will be given a medicine that makes you go to sleep (general anesthetic).   The surgeon will make a cut (incision) through the skin in your lower belly.   The incision may be about 5-7 inches long. It may go side-to-side or up-and-down.   The surgeon will move aside the body tissue that covers your uterus. The surgeon will then carefully take out your uterus along with any of your other reproductive organs that need to be removed.   Bleeding will be controlled with clamps or sutures.   The surgeon will close your incision with sutures or metal clips.  AFTER THE PROCEDURE   You will have some pain immediately after the procedure.   You will be given pain medicine in the recovery room.   You will be taken to your hospital room when you have recovered from the anesthesia.   You may need to stay in the hospital for 2-5 days.   You will be given instructions for recovery at home.     This information is not intended 

## 2015-06-09 NOTE — Progress Notes (Signed)
Patient discharged home with husband... Discharge instructions reviewed with patient and she verbalized understanding... Condition stable... No equipment... Taken to car via wheelchair by E. Mannix Kroeker, RN.  

## 2015-06-09 NOTE — Discharge Summary (Signed)
Gynecology Physician Postoperative Discharge Summary  Patient ID: [name] FAUROT MRN: [id number] DOB/AGE: [birth year] 35 y.o.  Admit Date: 06/07/2015 Discharge Date: 06/09/2015  Preoperative Diagnoses: Chronic pelvic inflammatory disease, chronic pelvic pain, abnormal uterine bleeding, previous left salpingooophorectomy  Procedures:  HYSTERECTOMY TOTAL ABDOMINALwith  right salpingectomy. (N/A)  Hospital Course:  RAYEN [name] is a 35 y.o. [id number]  admitted for scheduled surgery.  She underwent the procedures as mentioned above, her operation was uncomplicated. For further details about surgery, please refer to the operative report. Patient had an uncomplicated postoperative course. By time of discharge on POD#2, her pain was controlled on oral pain medications; she was ambulating, voiding without difficulty, tolerating regular diet and passing flatus.  Benign pathology discussed with patient prior to discharge. She was deemed stable for discharge to home.   Significant Labs: CBC Latest Ref Rng 06/08/2015 05/31/2015 01/20/2015  WBC 4.0 - 10.5 K/uL 12.0(H) 4.3 4.7  Hemoglobin 12.0 - 15.0 g/dL 11.2(L) 12.2 13.2  Hematocrit 36.0 - 46.0 % 33.4(L) 36.3 37.4  Platelets 150 - 400 K/uL 290 341 367   Surgical Pathology Uterus and cervix, with portion of right fallopian tube - UTERUS: -ENDOMETRIUM: PROLIFERATIVE ENDOMETRIUM. NO HYPERPLASIA OR MALIGNANCY. -MYOMETRIUM: LEIOMYOMATA. NO MALIGNANCY. -SEROSA: FIBROUS ADHESIONS. NO ENDOMETRIOSIS OR MALIGNANCY. - CERVIX: BENIGN SQUAMOUS AND ENDOCERVICAL MUCOSA. NO DYSPLASIA OR MALIGNANCY. - RIGHT FALLOPIAN TUBE: MILD CHRONIC INFLAMMATION. NO ENDOMETRIOSIS OR MALIGNANCY.  Discharge Exam: Blood pressure 120/61, pulse 93, temperature 99 F (37.2 C), temperature source Oral, resp. rate 14, height 5\' 2"  (1.575 m), weight 137 lb (62.143 kg), last menstrual period 06/07/2015, SpO2 100 %, not currently breastfeeding. General appearance: alert and no distress   Resp: clear to auscultation bilaterally  Cardio: regular rate and rhythm  GI: soft, non-tender; bowel sounds normal; no masses, no organomegaly.  Incision: C/D/I, no erythema, no drainage noted Pelvic: scant blood on pad  Extremities: extremities normal, atraumatic, no cyanosis or edema and Homans sign is negative, no sign of DVT  Discharged Condition: Stable  Disposition: 01-Home or Self Care     Medication List    TAKE these medications        docusate sodium 100 MG capsule  Commonly known as:  COLACE  Take 1 capsule (100 mg total) by mouth 2 (two) times daily.     oxyCODONE-acetaminophen 5-325 MG tablet  Commonly known as:  PERCOCET/ROXICET  Take 1-2 tablets by mouth every 6 (six) hours as needed.       Follow-up Information    Follow up with Osborne Oman, MD On 07/13/2015.   Specialty:  Obstetrics and Gynecology   Why:  1:45pm for postoperative appointment.  Call clinic/come to MAU for any concerning issues   Contact information:   [place] [address] [phone]       Signed:  Verita Schneiders, MD, [place] Attending [place], [facility]

## 2015-06-23 ENCOUNTER — Telehealth: Payer: Self-pay | Admitting: *Deleted

## 2015-06-23 NOTE — Telephone Encounter (Signed)
Pt called and stated that she has some concerns regarding her recent surgery. She stated that she had hysterectomy on 3/7 and today she noticed a small amount of bleeding from the vagina when she went to the bathroom. The bleeding was oink and dark. She is also having some mild pain on the Rt side of abdomen which she has had for several days and is not getting worse. I advised pt that I will speak with Dr. Harolyn Rutherford and then call her back.  Pt voiced understanding.   1405 - Called pt back after consult w/Dr. Harolyn Rutherford and informed her that what she is experiencing is normal. Pt may use ice pack or heating pad to the affected area on Rt side where she is having some pain. She should notify us if she has heavy bleeding or severe pain. Pt voiced understanding

## 2015-07-13 ENCOUNTER — Ambulatory Visit: Payer: No Typology Code available for payment source | Admitting: Obstetrics & Gynecology

## 2015-07-28 ENCOUNTER — Ambulatory Visit (INDEPENDENT_AMBULATORY_CARE_PROVIDER_SITE_OTHER): Payer: BLUE CROSS/BLUE SHIELD | Admitting: Obstetrics & Gynecology

## 2015-07-28 ENCOUNTER — Encounter: Payer: Self-pay | Admitting: Obstetrics & Gynecology

## 2015-07-28 VITALS — BP 116/67 | HR 70 | Wt 134.1 lb

## 2015-07-28 DIAGNOSIS — Z09 Encounter for follow-up examination after completed treatment for conditions other than malignant neoplasm: Secondary | ICD-10-CM

## 2015-07-28 DIAGNOSIS — Z90710 Acquired absence of both cervix and uterus: Secondary | ICD-10-CM

## 2015-07-28 NOTE — Progress Notes (Signed)
  Subjective:     [name] [name] is a 35 y.o. [phone] female who presents to the clinic 8 weeks status post total abdominal hysterectomy for abnormal uterine bleeding and pelvic pain. Eating a regular diet without difficulty. Bowel movements are normal. The patient is not having any pain.  The following portions of the patient's history were reviewed and updated as appropriate: allergies, current medications, past family history, past medical history, past social history, past surgical history and problem list.  Review of Systems Pertinent items noted in HPI and remainder of comprehensive ROS otherwise negative.    Objective:    BP 116/67 mmHg  Pulse 70  Wt 134 lb 1.6 oz (60.827 kg)  LMP 06/07/2015 General:  alert and no distress  Abdomen: soft, bowel sounds active, non-tender  Incision:   healing well, no drainage, no erythema, no hernia, no seroma, no swelling, no dehiscence, incision well approximated  Pelvic: NEFG. Well-healed vaginal cuff.   06/07/2015 Surgical pathology Uterus and cervix, with portion of right fallopian tube - UTERUS: -ENDOMETRIUM: PROLIFERATIVE ENDOMETRIUM. NO HYPERPLASIA OR MALIGNANCY. -MYOMETRIUM: LEIOMYOMATA. NO MALIGNANCY. - SEROSA: FIBROUS ADHESIONS. NO ENDOMETRIOSIS OR MALIGNANCY. - CERVIX: BENIGN SQUAMOUS AND ENDOCERVICAL MUCOSA. NO DYSPLASIA OR MALIGNANCY. - RIGHT FALLOPIAN TUBE: MILD CHRONIC INFLAMMATION. NO ENDOMETRIOSIS OR MALIGNANCY. Assessment:   Doing well postoperatively. Operative findings again reviewed. Pathology report discussed.    Plan:   1. Continue any current medications. 2. Wound care discussed. 3. Activity restrictions: none 4. Anticipated return to work: now. 5. Follow up as needed.  Verita Schneiders, MD, [place] Attending Obstetrician & Gynecologist, [place] for [facility]

## 2015-07-28 NOTE — Patient Instructions (Signed)
Return to clinic for any scheduled appointments or for any gynecologic concerns as needed.   

## 2015-09-14 ENCOUNTER — Telehealth: Payer: Self-pay | Admitting: *Deleted

## 2015-09-14 MED ORDER — METRONIDAZOLE 500 MG PO TABS: 500.0000 mg | ORAL_TABLET | Freq: Two times a day (BID) | ORAL | Status: DC

## 2015-09-14 NOTE — Telephone Encounter (Signed)
Pt left message stating that she has BV and requests Rx to be sent to her pharmacy. She stated that she sees Dr. Harolyn Rutherford. I returned pt's call and discussed her concern. She stated that she keeps having this problem and does not know why because she has done all the recommended treatments. I advised pt that I will send Rx to her pharmacy and recommended that she have appt to discuss further with the doctor. Pt voiced understanding and agreed to appt w/Dr. Harolyn Rutherford. She will be called by our scheduling staff within the next few days with the appt information.

## 2015-10-05 ENCOUNTER — Ambulatory Visit: Payer: BLUE CROSS/BLUE SHIELD | Admitting: Obstetrics & Gynecology

## 2015-10-19 ENCOUNTER — Ambulatory Visit (INDEPENDENT_AMBULATORY_CARE_PROVIDER_SITE_OTHER): Payer: BLUE CROSS/BLUE SHIELD | Admitting: Obstetrics & Gynecology

## 2015-10-19 ENCOUNTER — Ambulatory Visit (INDEPENDENT_AMBULATORY_CARE_PROVIDER_SITE_OTHER): Payer: BLUE CROSS/BLUE SHIELD | Admitting: Clinical

## 2015-10-19 ENCOUNTER — Encounter: Payer: Self-pay | Admitting: Obstetrics & Gynecology

## 2015-10-19 VITALS — BP 130/79 | HR 73 | Wt 135.5 lb

## 2015-10-19 DIAGNOSIS — F53 Postpartum depression: Secondary | ICD-10-CM

## 2015-10-19 DIAGNOSIS — A499 Bacterial infection, unspecified: Secondary | ICD-10-CM | POA: Diagnosis not present

## 2015-10-19 DIAGNOSIS — N760 Acute vaginitis: Secondary | ICD-10-CM

## 2015-10-19 DIAGNOSIS — Z113 Encounter for screening for infections with a predominantly sexual mode of transmission: Secondary | ICD-10-CM | POA: Diagnosis not present

## 2015-10-19 DIAGNOSIS — B9689 Other specified bacterial agents as the cause of diseases classified elsewhere: Secondary | ICD-10-CM

## 2015-10-19 DIAGNOSIS — O99345 Other mental disorders complicating the puerperium: Secondary | ICD-10-CM

## 2015-10-19 MED ORDER — BORIC ACID CRYS: 600.0000 mg | CRYSTALS | Freq: Every day | Status: DC

## 2015-10-19 MED ORDER — METRONIDAZOLE 500 MG PO TABS: 500.0000 mg | ORAL_TABLET | Freq: Two times a day (BID) | ORAL | Status: DC

## 2015-10-19 NOTE — Patient Instructions (Signed)
Return to clinic for any scheduled appointments or for any gynecologic concerns as needed.   

## 2015-10-19 NOTE — Progress Notes (Signed)
CLINIC ENCOUNTER NOTE  History:  35 y.o. [id number] here today for evaluation of abnormal vaginal discharge, desires STI screen. No known STI contacts.  Also reports continued postpartum depression, no HI/SI, just feels very overwhelmed She denies any abnormal vaginal discharge, bleeding, pelvic pain or other concerns.   Past Medical History  Diagnosis Date  . Ovarian cyst   . Abnormal Pap smear   . PID (pelvic inflammatory disease)   . Gonorrhea   . Trichomonas   . Fibroids   . IUFD (intrauterine fetal death)     Past Surgical History  Procedure Laterality Date  . Diagnostic laparoscopy  May 2012  . Laparoscopy N/A 06/26/2012    Procedure: LAPAROSCOPY OPERATIVE with CHROMOPERTUBATION;  Surgeon: Osborne Oman, MD;  Location: [facility];  Service: Gynecology;  Laterality: N/A;  . Salpingoophorectomy Left 06/26/2012    Procedure: SALPINGO OOPHORECTOMY;  Surgeon: Osborne Oman, MD;  Location: [facility];  Service: Gynecology;  Laterality: Left;  . Cesarean section N/A 11/17/2014    Procedure: CESAREAN SECTION;  Surgeon: Jonnie Kind, MD;  Location: [facility];  Service: Obstetrics;  Laterality: N/A;  . Abdominal hysterectomy N/A 06/07/2015    Procedure: HYSTERECTOMY TOTAL ABDOMINALwith  right salpingectomy.;  Surgeon: Osborne Oman, MD;  Location: [facility];  Service: Gynecology;  Laterality: N/A;    The following portions of the patient's history were reviewed and updated as appropriate: allergies, current medications, past family history, past medical history, past social history, past surgical history and problem list.   Review of Systems:  Pertinent items noted in HPI and remainder of comprehensive ROS otherwise negative.  Objective:  Physical Exam BP 130/79 mmHg  Pulse 73  Wt 135 lb 8 oz (61.462 kg)  LMP 06/07/2015 CONSTITUTIONAL: Well-developed, well-nourished female in no acute distress.  HENT:  Normocephalic, atraumatic. External right and left ear normal. Oropharynx is clear  and moist EYES: Conjunctivae and EOM are normal. Pupils are equal, round, and reactive to light. No scleral icterus.  NECK: Normal range of motion, supple, no masses SKIN: Skin is warm and dry. No rash noted. Not diaphoretic. No erythema. No pallor. NEUROLOGIC: Alert and oriented to person, place, and time. Normal reflexes, muscle tone coordination. No cranial nerve deficit noted. PSYCHIATRIC: Normal mood and affect. Normal behavior. Normal judgment and thought content. CARDIOVASCULAR: Normal heart rate noted RESPIRATORY: Effort and breath sounds normal, no problems with respiration noted ABDOMEN: Soft, no distention noted.   MUSCULOSKELETAL: Normal range of motion. No edema noted. PELVIC: Normal appearing external genitalia; normal appearing vaginal mucosa and cuff. Foul-smelling white discharge noted, wet prep obtained.    Assessment & Plan:  1. Screen for STD (sexually transmitted disease) - Wet prep, genital - Hepatitis B surface antigen - Hepatitis C antibody - Hepatitis panel, acute - HIV antibody - RPR - GC/Chlamydia probe amp ([place])not at [facility] Will follow up results and manage accordingly.  2. BV (bacterial vaginosis) 3. Recurrent vaginitis Flagyl prescribed, boric acid capsules also prescribed. Proper vulvar hygiene emphasized: discussed avoidance of perfumed soaps, detergents, lotions and any type of douches; in addition to wearing cotton underwear and no underwear at night.  Also recommended cleaning front to back, voiding and cleaning up after intercourse.  - Wet prep, genital - metroNIDAZOLE (FLAGYL) 500 MG tablet; Take 1 tablet (500 mg total) by mouth 2 (two) times daily.  Dispense: 14 tablet; Refill: 3 - Boric Acid CRYS; Place 600 mg vaginally at bedtime. Use vaginally every night for two weeks then  twice a week  Dispense: 500 g; Refill: 5  4. Postpartum depression Patient met with counselor today to help her with this. She will be given resources.   Routine  preventative health maintenance measures emphasized. Please refer to After Visit Summary for other counseling recommendations.   Return for any concerns.  Total face-to-face time with patient: 15 minutes. Over 50% of encounter was spent on counseling and coordination of care.   Verita Schneiders, MD, [place] Attending [place], [facility] for [employer], [place]

## 2015-10-20 LAB — WET PREP, GENITAL
TRICH WET PREP: NONE SEEN
WBC WET PREP: NONE SEEN
Yeast Wet Prep HPF POC: NONE SEEN

## 2015-10-20 LAB — HEPATITIS PANEL, ACUTE
HCV Ab: NEGATIVE
HEP A IGM: NONREACTIVE
HEP B S AG: NEGATIVE
Hep B C IgM: NONREACTIVE

## 2015-10-20 LAB — HEPATITIS B SURFACE ANTIGEN: Hepatitis B Surface Ag: NEGATIVE

## 2015-10-20 LAB — RPR

## 2015-10-20 LAB — HEPATITIS C ANTIBODY: HCV AB: NEGATIVE

## 2015-10-20 LAB — HIV ANTIBODY (ROUTINE TESTING W REFLEX): HIV 1&2 Ab, 4th Generation: NONREACTIVE

## 2015-10-20 NOTE — Progress Notes (Signed)
  ASSESSMENT: Pt currently experiencing Postpartum depression. Pt needs to f/u with OB and [facility]. Pt would benefit from psychoeducation and brief therapeutic interventions. Pt may benefit from social support group. Stage of Change: contemplative  PLAN: 1. F/U with behavioral health clinician in two weeks, or as needed 2. Psychiatric Medications: none 3. Behavioral recommendations:   -Consider Feelings After Birth support group at [facility], Tuesdays, 10-11am -Consider reading educational material regarding coping with symptoms of depression and anxiety  SUBJECTIVE: Pt. referred by Dr Harolyn Rutherford for symptoms of anxiety and depression Pt. reports the following symptoms/concerns: Pt states that she did not tell anyone she was feeling depressed/anxious with SI (thoughts only, no intent, no plan) off and on in first 3 months of son's birth, and is currently feeling some stress over having not talked about it previously. Pt says she coped by "just getting through it", and says it would help to talk further about how she was feeling  Duration of problem: About 11 months since childbirth Severity: mild today   OBJECTIVE: Orientation & Cognition: Oriented x3. Thought processes normal and appropriate to situation. Mood: appropriate Affect: appropriate Appearance: appropriate Risk of harm to self or others: no known risk of harm to self or others Substance use: tobacco Assessments administered: none   Diagnosis: Postpartum depression CPT Code: F53.0  -------------------------------------------- Other(s) present in the room:  16 month old son  Time spent with patient in exam room: 30 minutes, 4:20-4:50pm   Depression screen [facility] 2/9 10/22/2014 10/08/2014 09/30/2014 09/16/2014 09/09/2014  Decreased Interest 0 0 0 0 0  Down, Depressed, Hopeless 0 0 0 0 0  PHQ - 2 Score 0 0 0 0 0

## 2015-12-27 ENCOUNTER — Inpatient Hospital Stay (HOSPITAL_COMMUNITY)
Admission: AD | Admit: 2015-12-27 | Discharge: 2015-12-27 | Disposition: A | Discharge status: Home / Self Care | Payer: BLUE CROSS/BLUE SHIELD | Source: Ambulatory Visit | Attending: Family Medicine | Admitting: Family Medicine

## 2015-12-27 ENCOUNTER — Inpatient Hospital Stay (HOSPITAL_COMMUNITY): Payer: BLUE CROSS/BLUE SHIELD

## 2015-12-27 ENCOUNTER — Encounter (HOSPITAL_COMMUNITY): Payer: Self-pay | Admitting: *Deleted

## 2015-12-27 DIAGNOSIS — N83201 Unspecified ovarian cyst, right side: Secondary | ICD-10-CM | POA: Insufficient documentation

## 2015-12-27 DIAGNOSIS — F17210 Nicotine dependence, cigarettes, uncomplicated: Secondary | ICD-10-CM | POA: Insufficient documentation

## 2015-12-27 DIAGNOSIS — R109 Unspecified abdominal pain: Secondary | ICD-10-CM | POA: Diagnosis present

## 2015-12-27 LAB — URINALYSIS, ROUTINE W REFLEX MICROSCOPIC
Bilirubin Urine: NEGATIVE
GLUCOSE, UA: NEGATIVE mg/dL
HGB URINE DIPSTICK: NEGATIVE
Ketones, ur: NEGATIVE mg/dL
Leukocytes, UA: NEGATIVE
Nitrite: NEGATIVE
Protein, ur: NEGATIVE mg/dL
SPECIFIC GRAVITY, URINE: 1.02 (ref 1.005–1.030)
pH: 6 (ref 5.0–8.0)

## 2015-12-27 LAB — CBC
HEMATOCRIT: 39.8 % (ref 36.0–46.0)
Hemoglobin: 13.7 g/dL (ref 12.0–15.0)
MCH: 28.1 pg (ref 26.0–34.0)
MCHC: 34.4 g/dL (ref 30.0–36.0)
MCV: 81.7 fL (ref 78.0–100.0)
Platelets: 323 10*3/uL (ref 150–400)
RBC: 4.87 MIL/uL (ref 3.87–5.11)
RDW: 13.9 % (ref 11.5–15.5)
WBC: 5.8 10*3/uL (ref 4.0–10.5)

## 2015-12-27 MED ORDER — TRAMADOL HCL 50 MG PO TABS: 50.0000 mg | ORAL_TABLET | ORAL | 0 refills | Status: DC | PRN

## 2015-12-27 NOTE — Progress Notes (Signed)
Urine sent to lab @1139 

## 2015-12-27 NOTE — MAU Provider Note (Signed)
History     CSN: [id number]  Arrival date and time: 12/27/15 1117   First Provider Initiated Contact with Patient 12/27/15 1239      Chief Complaint  Patient presents with  . Abdominal Pain   [name] [name] is a 35 y.o. Female who presents with abdominal pain. Previously seen by Dr. Harolyn Rutherford who performed TAH & right salpingectomy on her earlier this year. Had left salpingoophorectomy 3 years ago. States pain feels like previous ovarian cysts.    Abdominal Pain  This is a recurrent problem. The current episode started 1 to 4 weeks ago. The problem occurs constantly. The problem has been gradually worsening. The pain is located in the RLQ. The pain is at a severity of 8/10. The quality of the pain is cramping, sharp and dull. The abdominal pain does not radiate. Pertinent negatives include no anorexia, constipation, diarrhea, fever, flatus, frequency, nausea, vomiting or weight loss. Nothing aggravates the pain. The pain is relieved by nothing. She has tried acetaminophen for the symptoms. The treatment provided no relief. Prior diagnostic workup includes surgery and ultrasound. Her past medical history is significant for abdominal surgery (Had TAH & right salpingectomy earlier this year). There is no history of Crohn's disease, irritable bowel syndrome or ulcerative colitis.   OB History    Gravida Para Term Preterm AB Living   4 4 1 3  0 1   SAB TAB Ectopic Multiple Live Births   0 0 0 0 1      Past Medical History:  Diagnosis Date  . Abnormal Pap smear   . Fibroids   . Gonorrhea   . IUFD (intrauterine fetal death)   . Ovarian cyst   . PID (pelvic inflammatory disease)   . Trichomonas     Past Surgical History:  Procedure Laterality Date  . ABDOMINAL HYSTERECTOMY N/A 06/07/2015   Procedure: HYSTERECTOMY TOTAL ABDOMINALwith  right salpingectomy.;  Surgeon: Osborne Oman, MD;  Location: [facility];  Service: Gynecology;  Laterality: N/A;  . CESAREAN SECTION N/A 11/17/2014    Procedure: CESAREAN SECTION;  Surgeon: Jonnie Kind, MD;  Location: [facility];  Service: Obstetrics;  Laterality: N/A;  . DIAGNOSTIC LAPAROSCOPY  May 2012  . LAPAROSCOPY N/A 06/26/2012   Procedure: LAPAROSCOPY OPERATIVE with CHROMOPERTUBATION;  Surgeon: Osborne Oman, MD;  Location: [facility];  Service: Gynecology;  Laterality: N/A;  . SALPINGOOPHORECTOMY Left 06/26/2012   Procedure: SALPINGO OOPHORECTOMY;  Surgeon: Osborne Oman, MD;  Location: [facility];  Service: Gynecology;  Laterality: Left;    Family History  Problem Relation Age of Onset  . Cystic fibrosis Mother   . Hypertension Mother   . Diabetes Mother   . Anesthesia problems Neg Hx     Social History  Substance Use Topics  . Smoking status: Current Every Day Smoker    Packs/day: 0.25    Years: 10.00    Types: Cigarettes  . Smokeless tobacco: Never Used  . Alcohol use Yes     Comment: occasional    Allergies:  Allergies  Allergen Reactions  . Peanut-Containing Drug Products Anaphylaxis  . Spinach Anaphylaxis  . Nsaids Hives, Itching and Nausea And Vomiting    Prescriptions Prior to Admission  Medication Sig Dispense Refill Last Dose  . Boric Acid CRYS Place 600 mg vaginally at bedtime. Use vaginally every night for two weeks then twice a week (Patient not taking: Reported on 12/27/2015) 500 g 5 Not Taking at Unknown time  . docusate sodium (COLACE) 100 MG  capsule Take 1 capsule (100 mg total) by mouth 2 (two) times daily. (Patient not taking: Reported on 12/27/2015) 30 capsule 3 Not Taking at Unknown time  . metroNIDAZOLE (FLAGYL) 500 MG tablet Take 1 tablet (500 mg total) by mouth 2 (two) times daily. (Patient not taking: Reported on 12/27/2015) 14 tablet 3 Not Taking at Unknown time  . oxyCODONE-acetaminophen (PERCOCET/ROXICET) 5-325 MG tablet Take 1-2 tablets by mouth every 6 (six) hours as needed. (Patient not taking: Reported on 12/27/2015) 90 tablet 0 Not Taking at Unknown time    Review of Systems   Constitutional: Negative for chills, fever and weight loss.  Gastrointestinal: Positive for abdominal pain. Negative for anorexia, constipation, diarrhea, flatus, nausea and vomiting.  Genitourinary: Negative.  Negative for frequency.   Physical Exam   Blood pressure 118/75, pulse 79, temperature 98.6 F (37 C), temperature source Oral, resp. rate 18, weight 131 lb 12 oz (59.8 kg), last menstrual period 06/07/2015, not currently breastfeeding.  Physical Exam  Nursing note and vitals reviewed. Constitutional: She is oriented to person, place, and time. She appears well-developed and well-nourished. No distress.  HENT:  Head: Normocephalic and atraumatic.  Eyes: Conjunctivae are normal. Right eye exhibits no discharge. Left eye exhibits no discharge. No scleral icterus.  Neck: Normal range of motion.  Cardiovascular: Normal rate, regular rhythm and normal heart sounds.   No murmur heard. Respiratory: Effort normal and breath sounds normal. No respiratory distress. She has no wheezes.  GI: Soft. Bowel sounds are normal. She exhibits mass. She exhibits no distension. There is tenderness in the right lower quadrant. There is no rigidity, no rebound and no guarding.  Genitourinary: Right adnexum displays tenderness and fullness.  Neurological: She is alert and oriented to person, place, and time.  Skin: Skin is warm and dry. She is not diaphoretic.  Psychiatric: She has a normal mood and affect. Her behavior is normal. Judgment and thought content normal.    MAU Course  Procedures Results for orders placed or performed during the hospital encounter of 12/27/15 (from the past 24 hour(s))  CBC     Status: None   Collection Time: 12/27/15 12:48 PM  Result Value Ref Range   WBC 5.8 4.0 - 10.5 K/uL   RBC 4.87 3.87 - 5.11 MIL/uL   Hemoglobin 13.7 12.0 - 15.0 g/dL   HCT 39.8 36.0 - 46.0 %   MCV 81.7 78.0 - 100.0 fL   MCH 28.1 26.0 - 34.0 pg   MCHC 34.4 30.0 - 36.0 g/dL   RDW 13.9 11.5 -  15.5 %   Platelets 323 150 - 400 K/uL  Urinalysis, Routine w reflex microscopic (not at [facility])     Status: None   Collection Time: 12/27/15  1:39 PM  Result Value Ref Range   Color, Urine YELLOW YELLOW   APPearance CLEAR CLEAR   Specific Gravity, Urine 1.020 1.005 - 1.030   pH 6.0 5.0 - 8.0   Glucose, UA NEGATIVE NEGATIVE mg/dL   Hgb urine dipstick NEGATIVE NEGATIVE   Bilirubin Urine NEGATIVE NEGATIVE   Ketones, ur NEGATIVE NEGATIVE mg/dL   Protein, ur NEGATIVE NEGATIVE mg/dL   Nitrite NEGATIVE NEGATIVE   Leukocytes, UA NEGATIVE NEGATIVE   US Transvaginal Non-ob  Result Date: 12/27/2015 CLINICAL DATA:  Two weeks of right lower quadrant pain. History of hysterectomy and left salpingo oophorectomy in March 2017. EXAM: TRANSABDOMINAL AND TRANSVAGINAL ULTRASOUND OF PELVIS TECHNIQUE: Both transabdominal and transvaginal ultrasound examinations of the pelvis were performed. Transabdominal technique was performed  for global imaging of the pelvis including uterus, ovaries, adnexal regions, and pelvic cul-de-sac. It was necessary to proceed with endovaginal exam following the transabdominal exam to visualize the right ovary and adnexal region. COMPARISON:  None since the previous hysterectomy. FINDINGS: Uterus The uterus is surgically absent. Right ovary Measurements: 5.5 x 4.7 x 5.4 cm. There is a complex cystic component of the right ovary measuring 4.3 x 3.7 x 3.1 cm. There is blood flow in the periphery using Doppler interrogation. Left ovary The left ovary is surgically absent. Other findings There is a trace of free pelvic fluid IMPRESSION: Complex cystic structure in the right ovary measuring 4.3 x 3.7 x 3.1 cm not typical for a hemorrhagic cyst. Neoplasm either benign or malignant is not excluded. Gynecological evaluation is recommended. Pelvic MRI may be useful. Electronically Signed   By: David  Martinique M.D.   On: 12/27/2015 14:53   US Pelvis Complete  Result Date: 12/27/2015 CLINICAL DATA:   Two weeks of right lower quadrant pain. History of hysterectomy and left salpingo oophorectomy in March 2017. EXAM: TRANSABDOMINAL AND TRANSVAGINAL ULTRASOUND OF PELVIS TECHNIQUE: Both transabdominal and transvaginal ultrasound examinations of the pelvis were performed. Transabdominal technique was performed for global imaging of the pelvis including uterus, ovaries, adnexal regions, and pelvic cul-de-sac. It was necessary to proceed with endovaginal exam following the transabdominal exam to visualize the right ovary and adnexal region. COMPARISON:  None since the previous hysterectomy. FINDINGS: Uterus The uterus is surgically absent. Right ovary Measurements: 5.5 x 4.7 x 5.4 cm. There is a complex cystic component of the right ovary measuring 4.3 x 3.7 x 3.1 cm. There is blood flow in the periphery using Doppler interrogation. Left ovary The left ovary is surgically absent. Other findings There is a trace of free pelvic fluid IMPRESSION: Complex cystic structure in the right ovary measuring 4.3 x 3.7 x 3.1 cm not typical for a hemorrhagic cyst. Neoplasm either benign or malignant is not excluded. Gynecological evaluation is recommended. Pelvic MRI may be useful. Electronically Signed   By: David  Martinique M.D.   On: 12/27/2015 14:53    MDM CBC -- no leukocytosis Ultrasound -- right ovarian cyst Pt allergic to NSAIDs and drove self here -- no pain meds given in MAU  Assessment and Plan  A: 1. Right ovarian cyst   2. Abdominal pain in female     P: Discharge home Rx ultram Call [facility] to schedule f/u with Dr. Harolyn Rutherford -- pt only wants to see Dr. Harolyn Rutherford -- gave pt phone number for all [facility] offices   Jorje Guild 12/27/2015, 12:37 PM

## 2015-12-27 NOTE — Discharge Instructions (Signed)
Ovarian Cyst An ovarian cyst is a fluid-filled sac that forms on an ovary. The ovaries are small organs that produce eggs in women. Various types of cysts can form on the ovaries. Most are not cancerous. Many do not cause problems, and they often go away on their own. Some may cause symptoms and require treatment. Common types of ovarian cysts include:  Functional cysts--These cysts may occur every month during the menstrual cycle. This is normal. The cysts usually go away with the next menstrual cycle if the woman does not get pregnant. Usually, there are no symptoms with a functional cyst.  Endometrioma cysts--These cysts form from the tissue that lines the uterus. They are also called "chocolate cysts" because they become filled with blood that turns brown. This type of cyst can cause pain in the lower abdomen during intercourse and with your menstrual period.  Cystadenoma cysts--This type develops from the cells on the outside of the ovary. These cysts can get very big and cause lower abdomen pain and pain with intercourse. This type of cyst can twist on itself, cut off its blood supply, and cause severe pain. It can also easily rupture and cause a lot of pain.  Dermoid cysts--This type of cyst is sometimes found in both ovaries. These cysts may contain different kinds of body tissue, such as skin, teeth, hair, or cartilage. They usually do not cause symptoms unless they get very big.  Theca lutein cysts--These cysts occur when too much of a certain hormone (human chorionic gonadotropin) is produced and overstimulates the ovaries to produce an egg. This is most common after procedures used to assist with the conception of a baby (in vitro fertilization). CAUSES   Fertility drugs can cause a condition in which multiple large cysts are formed on the ovaries. This is called ovarian hyperstimulation syndrome.  A condition called polycystic ovary syndrome can cause hormonal imbalances that can lead to  nonfunctional ovarian cysts. SIGNS AND SYMPTOMS  Many ovarian cysts do not cause symptoms. If symptoms are present, they may include:  Pelvic pain or pressure.  Pain in the lower abdomen.  Pain during sexual intercourse.  Increasing girth (swelling) of the abdomen.  Abnormal menstrual periods.  Increasing pain with menstrual periods.  Stopping having menstrual periods without being pregnant. DIAGNOSIS  These cysts are commonly found during a routine or annual pelvic exam. Tests may be ordered to find out more about the cyst. These tests may include:  Ultrasound.  X-ray of the pelvis.  CT scan.  MRI.  Blood tests. TREATMENT  Many ovarian cysts go away on their own without treatment. Your health care provider may want to check your cyst regularly for 2-3 months to see if it changes. For women in menopause, it is particularly important to monitor a cyst closely because of the higher rate of ovarian cancer in menopausal women. When treatment is needed, it may include any of the following:  A procedure to drain the cyst (aspiration). This may be done using a long needle and ultrasound. It can also be done through a laparoscopic procedure. This involves using a thin, lighted tube with a tiny camera on the end (laparoscope) inserted through a small incision.  Surgery to remove the whole cyst. This may be done using laparoscopic surgery or an open surgery involving a larger incision in the lower abdomen.  Hormone treatment or birth control pills. These methods are sometimes used to help dissolve a cyst. HOME CARE INSTRUCTIONS   Only take over-the-counter   or prescription medicines as directed by your health care provider.  Follow up with your health care provider as directed.  Get regular pelvic exams and Pap tests. SEEK MEDICAL CARE IF:   Your periods are late, irregular, or painful, or they stop.  Your pelvic pain or abdominal pain does not go away.  Your abdomen becomes  larger or swollen.  You have pressure on your bladder or trouble emptying your bladder completely.  You have pain during sexual intercourse.  You have feelings of fullness, pressure, or discomfort in your stomach.  You lose weight for no apparent reason.  You feel generally ill.  You become constipated.  You lose your appetite.  You develop acne.  You have an increase in body and facial hair.  You are gaining weight, without changing your exercise and eating habits.  You think you are pregnant. SEEK IMMEDIATE MEDICAL CARE IF:   You have increasing abdominal pain.  You feel sick to your stomach (nauseous), and you throw up (vomit).  You develop a fever that comes on suddenly.  You have abdominal pain during a bowel movement.  Your menstrual periods become heavier than usual. MAKE SURE YOU:  Understand these instructions.  Will watch your condition.  Will get help right away if you are not doing well or get worse.   This information is not intended to replace advice given to you by your health care provider. Make sure you discuss any questions you have with your health care provider.   Document Released: 03/19/2005 Document Revised: 03/24/2013 Document Reviewed: 11/24/2012 Elsevier Interactive Patient Education 2016 [employer]  

## 2015-12-27 NOTE — MAU Note (Signed)
Having a lot of abd pain, esp on right side. Hurting bad for 2 wks.. Had TAH 3/7.  Hurts worse when she has to pass gas or go to the bathroom.  Has been constipated.

## 2015-12-29 ENCOUNTER — Telehealth: Payer: Self-pay | Admitting: *Deleted

## 2015-12-29 NOTE — Telephone Encounter (Signed)
[name] [name] called this am and left a voice message that she is a patient of Dr. Harolyn Rutherford and was seen in ER with ovarian cyst- was given tramadol. States this is too strong- made her nauseated and vomiting all day- wants to know if she can take a different medicine or medicine for nausea / vomiting.    I called Dr. Harolyn Rutherford and she suggested patient take motrin 800 mg  TId.  I called [facility] and per discussion and further chart review she cannot take any nsaids.  I then discussed with Dr. Roselie Awkward because Dr. Harolyn Rutherford Was unavailable. He suggested tylenol extra strength.   I explained to [place] that we did not want to give her nausea medicine because of her reaction to the tramadol and we don't want her to have a worse reaction.  We discussed with her allergies to all nsaids there is not much else we can prescribe.  She states extra strength tylenol does not really help either.  She has not yet scheduled a follow up visit-states they gave her a list of our locations- states she will schedule an appt. soon. She states she  wants surgery- I explained that would have to be a visit with a doctor.  I also informed her I would forward this note to Dr. Harolyn Rutherford and if there was anything else we could prescribe we would call  Her. She voices understanding.

## 2017-06-03 ENCOUNTER — Encounter: Payer: Self-pay | Admitting: *Deleted

## 2018-04-23 ENCOUNTER — Encounter: Payer: Self-pay | Admitting: Obstetrics & Gynecology

## 2018-04-23 ENCOUNTER — Ambulatory Visit (INDEPENDENT_AMBULATORY_CARE_PROVIDER_SITE_OTHER): Payer: 59 | Admitting: Obstetrics & Gynecology

## 2018-04-23 VITALS — BP 146/92 | HR 70 | Wt 153.4 lb

## 2018-04-23 DIAGNOSIS — N731 Chronic parametritis and pelvic cellulitis: Secondary | ICD-10-CM

## 2018-04-23 DIAGNOSIS — N83299 Other ovarian cyst, unspecified side: Secondary | ICD-10-CM | POA: Diagnosis not present

## 2018-04-23 DIAGNOSIS — Z716 Tobacco abuse counseling: Secondary | ICD-10-CM

## 2018-04-23 DIAGNOSIS — L680 Hirsutism: Secondary | ICD-10-CM | POA: Diagnosis not present

## 2018-04-23 DIAGNOSIS — R102 Pelvic and perineal pain: Secondary | ICD-10-CM

## 2018-04-23 MED ORDER — VARENICLINE TARTRATE 0.5 MG PO TABS: 0.5000 mg | ORAL_TABLET | Freq: Two times a day (BID) | ORAL | 2 refills | Status: AC

## 2018-04-23 MED ORDER — SPIRONOLACTONE 50 MG PO TABS: 50.0000 mg | ORAL_TABLET | Freq: Two times a day (BID) | ORAL | 2 refills | Status: AC

## 2018-04-23 NOTE — Progress Notes (Signed)
GYNECOLOGY OFFICE VISIT NOTE  History:  38 y.o. [id number] here today for consultation about removal of her right ovary. Long history of pelvic pain, has chronic PID, recurrent ovarian cysts and has undergone left salpingoophorectomy then TAH. Had scan in 01/2018 at [facility] that showed recurrent 3 cm cyst on right ovary. She still gets this periodically and the pain is significant. Wants to discuss removal.  Also reports increased hirsutism especially on her face, wants to know what she can do to help with this. She denies any current abnormal vaginal discharge, bleeding, pelvic pain or other concerns.   Past Medical History:  Diagnosis Date  . Abnormal Pap smear   . Fibroids   . Gonorrhea   . IUFD (intrauterine fetal death)   . Ovarian cyst   . PID (pelvic inflammatory disease)   . Trichomonas     Past Surgical History:  Procedure Laterality Date  . ABDOMINAL HYSTERECTOMY N/A 06/07/2015   Procedure: HYSTERECTOMY TOTAL ABDOMINALwith  right salpingectomy.;  Surgeon: Osborne Oman, MD;  Location: [facility];  Service: Gynecology;  Laterality: N/A;  . CESAREAN SECTION N/A 11/17/2014   Procedure: CESAREAN SECTION;  Surgeon: Jonnie Kind, MD;  Location: [facility];  Service: Obstetrics;  Laterality: N/A;  . DIAGNOSTIC LAPAROSCOPY  May 2012  . LAPAROSCOPY N/A 06/26/2012   Procedure: LAPAROSCOPY OPERATIVE with CHROMOPERTUBATION;  Surgeon: Osborne Oman, MD;  Location: [facility];  Service: Gynecology;  Laterality: N/A;  . SALPINGOOPHORECTOMY Left 06/26/2012   Procedure: SALPINGO OOPHORECTOMY;  Surgeon: Osborne Oman, MD;  Location: [facility];  Service: Gynecology;  Laterality: Left;    The following portions of the patient's history were reviewed and updated as appropriate: allergies, current medications, past family history, past medical history, past social history, past surgical history and problem list.    Review of Systems:  Pertinent items noted in HPI and remainder of comprehensive ROS  otherwise negative.  Objective:  Physical Exam BP (!) 146/92   Pulse 70   Wt 153 lb 6.4 oz (69.6 kg)   LMP 06/07/2015   BMI 28.06 kg/m  CONSTITUTIONAL: Well-developed, well-nourished female in no acute distress.  HEENT:  Normocephalic, atraumatic. External right and left ear normal. No scleral icterus.  NECK: Normal range of motion, supple, no masses noted on observation SKIN: No rash noted. Not diaphoretic. No erythema. No pallor. MUSCULOSKELETAL: Normal range of motion. No edema noted. NEUROLOGIC: Alert and oriented to person, place, and time. Normal muscle tone coordination. No cranial nerve deficit noted. PSYCHIATRIC: Normal mood and affect. Normal behavior. Normal judgment and thought content. CARDIOVASCULAR: Normal heart rate noted RESPIRATORY: Effort and breath sounds normal, no problems with respiration noted ABDOMEN: No masses noted. No other overt distention noted.   PELVIC: Deferred  Labs and Imaging  02/16/2018 US PELVIS TRANSABDOMINAL/TRANSVAGINAL CLINICAL HISTORY: Pelvic pain COMPARISON: None TECHNIQUE: Multiplanar, real-time gray-scale and color Doppler sonographic imaging of the pelvis was performed with supine positioning.  FINDINGS: Status post partial hysterectomy. Right ovary measures 4.6 cm. Left ovary is not well-visualized. Transvaginal evaluation demonstrates 3 cm cystic right adnexal mass. Free fluid in the cul-de-sac. Differential diagnosis to include hemorrhagic functional cyst, endometrioma, cystic ovarian neoplasm or postoperative seroma. No torsion. There is limited visualization of portions of the pelvis due to bowel gas artifact. The bladder is only partially included, but appears grossly normal.  IMPRESSION: 1. 3 cm cystic right adnexal mass 2. No torsion  Assessment & Plan:  1. Pelvic pain 2. Chronic Pelvic Inflammatory Disease 3.  Recurrent physiological ovarian cysts Discussed that removal of her remaining ovary will render her menopausal,  and she will need HRT. Also concerned that her pain may not still go away after this given chronic PID.  Wanted to recommend trial of OCPs, but her BP today is very elevated. Also smokes; elevated BP and smoking increase VTE risk. She reports having normal BPs usually, patient also smokes. She declines trial of progestin/Depo Provera for now, does not want weight gain.  She will return in one month, and we could start OCPs if BP is stable. Advised to take NSAIDs as needed.    4. Hirsutism Discussed treatment with spironolactone, she wants to try this.  OCPs will also help if we start this later. - spironolactone (ALDACTONE) 50 MG tablet; Take 1 tablet (50 mg total) by mouth 2 (two) times daily. Will start with 50 mg twice daily, and increase to 100 mg twice daily as needed  Dispense: 60 tablet; Refill: 2  5. Encounter for smoking cessation counseling She desires Chantix, this was prescribed. Will follow up. - varenicline (CHANTIX) 0.5 MG tablet; Take 1 tablet (0.5 mg total) by mouth 2 (two) times daily.  Dispense: 60 tablet; Refill: 2   Routine preventative health maintenance measures emphasized. Please refer to After Visit Summary for other counseling recommendations.   Return in about 1 month (around 05/24/2018) for Follow up .   Total face-to-face time with patient: 25 minutes.  Over 50% of encounter was spent on counseling and coordination of care.   Verita Schneiders, MD, [facility] [employer] [facility], [place]

## 2018-04-23 NOTE — Patient Instructions (Signed)
Return to clinic for any scheduled appointments or for any gynecologic concerns as needed.   

## 2018-05-21 ENCOUNTER — Ambulatory Visit: Payer: Self-pay | Admitting: Obstetrics & Gynecology
